# Patient Record
Sex: Male | Born: 1965 | Race: White | Hispanic: No | Marital: Married | Smoking: Never smoker
Health system: Southern US, Community
[De-identification: ages and names within clinical notes are randomized; demographics above are authoritative.]

## PROBLEM LIST (undated history)

## (undated) DIAGNOSIS — F419 Anxiety disorder, unspecified: Secondary | ICD-10-CM

## (undated) DIAGNOSIS — M25569 Pain in unspecified knee: Secondary | ICD-10-CM

## (undated) DIAGNOSIS — M79672 Pain in left foot: Secondary | ICD-10-CM

## (undated) DIAGNOSIS — E739 Lactose intolerance, unspecified: Secondary | ICD-10-CM

## (undated) DIAGNOSIS — M25512 Pain in left shoulder: Secondary | ICD-10-CM

## (undated) DIAGNOSIS — J309 Allergic rhinitis, unspecified: Secondary | ICD-10-CM

## (undated) HISTORY — DX: Allergic rhinitis, unspecified: J30.9

## (undated) HISTORY — DX: Lactose intolerance, unspecified: E73.9

## (undated) HISTORY — DX: Pain in left foot: M79.672

## (undated) HISTORY — DX: Anxiety disorder, unspecified: F41.9

## (undated) HISTORY — DX: Pain in left shoulder: M25.512

## (undated) HISTORY — DX: Pain in unspecified knee: M25.569

---

## 1989-12-28 HISTORY — PX: NOSE SURGERY: SHX723

## 2008-12-07 ENCOUNTER — Encounter: Payer: Self-pay | Admitting: Internal Medicine

## 2009-02-12 ENCOUNTER — Ambulatory Visit: Payer: Self-pay | Admitting: Internal Medicine

## 2009-02-12 DIAGNOSIS — J309 Allergic rhinitis, unspecified: Secondary | ICD-10-CM | POA: Insufficient documentation

## 2009-02-12 DIAGNOSIS — R7989 Other specified abnormal findings of blood chemistry: Secondary | ICD-10-CM | POA: Insufficient documentation

## 2009-02-12 DIAGNOSIS — E739 Lactose intolerance, unspecified: Secondary | ICD-10-CM | POA: Insufficient documentation

## 2009-02-12 DIAGNOSIS — M25569 Pain in unspecified knee: Secondary | ICD-10-CM | POA: Insufficient documentation

## 2009-02-12 LAB — CONVERTED CEMR LAB
BUN: 15 mg/dL (ref 6–23)
Calcium: 9.4 mg/dL (ref 8.4–10.5)
Creatinine, Ser: 1.3 mg/dL (ref 0.4–1.5)
GFR calc Af Amer: 79 mL/min
GFR calc non Af Amer: 65 mL/min

## 2011-07-20 ENCOUNTER — Encounter: Payer: Self-pay | Admitting: Internal Medicine

## 2011-07-20 ENCOUNTER — Ambulatory Visit (INDEPENDENT_AMBULATORY_CARE_PROVIDER_SITE_OTHER): Payer: BC Managed Care – PPO | Admitting: Internal Medicine

## 2011-07-20 ENCOUNTER — Other Ambulatory Visit (INDEPENDENT_AMBULATORY_CARE_PROVIDER_SITE_OTHER): Payer: BC Managed Care – PPO

## 2011-07-20 ENCOUNTER — Other Ambulatory Visit: Payer: Self-pay | Admitting: Internal Medicine

## 2011-07-20 VITALS — BP 112/64 | HR 69 | Temp 99.1°F | Ht 71.0 in | Wt 174.8 lb

## 2011-07-20 DIAGNOSIS — R5381 Other malaise: Secondary | ICD-10-CM

## 2011-07-20 DIAGNOSIS — R05 Cough: Secondary | ICD-10-CM

## 2011-07-20 DIAGNOSIS — R5383 Other fatigue: Secondary | ICD-10-CM

## 2011-07-20 DIAGNOSIS — N289 Disorder of kidney and ureter, unspecified: Secondary | ICD-10-CM | POA: Insufficient documentation

## 2011-07-20 DIAGNOSIS — R059 Cough, unspecified: Secondary | ICD-10-CM

## 2011-07-20 LAB — CBC WITH DIFFERENTIAL/PLATELET
Basophils Relative: 0.4 % (ref 0.0–3.0)
Eosinophils Absolute: 0.1 10*3/uL (ref 0.0–0.7)
HCT: 38.7 % — ABNORMAL LOW (ref 39.0–52.0)
Hemoglobin: 13.6 g/dL (ref 13.0–17.0)
Lymphocytes Relative: 39.3 % (ref 12.0–46.0)
MCHC: 35.3 g/dL (ref 30.0–36.0)
MCV: 90.6 fl (ref 78.0–100.0)
Neutro Abs: 3.2 10*3/uL (ref 1.4–7.7)
RBC: 4.27 Mil/uL (ref 4.22–5.81)

## 2011-07-20 LAB — HEPATIC FUNCTION PANEL
AST: 24 U/L (ref 0–37)
Alkaline Phosphatase: 55 U/L (ref 39–117)
Bilirubin, Direct: 0 mg/dL (ref 0.0–0.3)
Total Bilirubin: 0.3 mg/dL (ref 0.3–1.2)

## 2011-07-20 LAB — BASIC METABOLIC PANEL
BUN: 17 mg/dL (ref 6–23)
CO2: 30 mEq/L (ref 19–32)
Calcium: 9.2 mg/dL (ref 8.4–10.5)
Chloride: 97 mEq/L (ref 96–112)
Creatinine, Ser: 2 mg/dL — ABNORMAL HIGH (ref 0.4–1.5)
Glucose, Bld: 95 mg/dL (ref 70–99)

## 2011-07-20 LAB — TSH: TSH: 2.25 u[IU]/mL (ref 0.35–5.50)

## 2011-07-20 MED ORDER — LORATADINE 10 MG PO TABS: 10.0000 mg | ORAL_TABLET | Freq: Every day | ORAL | Status: DC

## 2011-07-20 MED ORDER — HYDROCODONE-HOMATROPINE 5-1.5 MG/5ML PO SYRP: 5.0000 mL | ORAL_SOLUTION | Freq: Four times a day (QID) | ORAL | Status: AC | PRN

## 2011-07-20 MED ORDER — OMEPRAZOLE 20 MG PO CPDR: 20.0000 mg | DELAYED_RELEASE_CAPSULE | Freq: Every day | ORAL | Status: DC

## 2011-07-20 MED ORDER — DIPHENHYDRAMINE HCL 25 MG PO CAPS: 25.0000 mg | ORAL_CAPSULE | Freq: Every evening | ORAL | Status: AC | PRN

## 2011-07-20 MED ORDER — FLUTICASONE PROPIONATE 50 MCG/ACT NA SUSP: 2.0000 | Freq: Every day | NASAL | Status: DC

## 2011-07-20 NOTE — Progress Notes (Signed)
  Subjective:     Angel Combs is a 45 y.o. male here for evaluation of a cough. Onset of symptoms was 4 weeks ago. Symptoms have been unchanged since that time. The cough is productive of yellow sputum and is aggravated by nothing. Associated symptoms include: no fever, no weight loss; positive for overwhelming fatigue. Patient does not have a history of asthma. Patient does have a history of environmental allergens. Patient has not traveled recently. Patient does not have a history of smoking. Patient has not had a previous chest x-ray. Patient has not had a PPD done.  The following portions of the patient's history were reviewed and updated as appropriate: allergies, current medications, past family history, past medical history, past social history, past surgical history and problem list.  Review of Systems Pertinent items are noted in HPI.    Objective:    Oxygen saturation 96% on room air BP 112/64  Pulse 69  Temp(Src) 99.1 F (37.3 C) (Oral)  Ht 5\' 11"  (1.803 m)  Wt 174 lb 12.8 oz (79.289 kg)  BMI 24.38 kg/m2  SpO2 96% General appearance: alert, flushed and no distress Eyes: negative findings: lids and lashes normal, conjunctivae and sclerae normal and pupils equal, round, reactive to light and accomodation Ears: normal TM's and external ear canals both ears Nose: Nares normal. Septum midline. Mucosa normal. No drainage or sinus tenderness. Throat: lips, mucosa, and tongue normal; teeth and gums normal Neck: no adenopathy, no carotid bruit, no JVD, supple, symmetrical, trachea midline and thyroid not enlarged, symmetric, no tenderness/mass/nodules Lungs: clear to auscultation bilaterally Heart: regular rate and rhythm, S1, S2 normal, no murmur, click, rub or gallop    Assessment:    Cough ;Fatigue   Plan:    Explained lack of efficacy of antibiotics in viral disease. Antitussives per medication orders. Avoid exposure to tobacco smoke and fumes. Call if shortness of breath  worsens, blood in sputum, change in character of cough, development of fever or chills, inability to maintain nutrition and hydration. Avoid exposure to tobacco smoke and fumes. Trial of antihistamines. Trial of proton pump inhibitor. Trial of steroid nasal spray. also labs to eval fatigue - seem viral syndrome related but will exclude other med illness

## 2011-07-20 NOTE — Patient Instructions (Addendum)
It was good to see you today. If you develop worsening symptoms or fever, call and we can reconsider antibiotics, but it does not appear necessary to use antibiotics at this time. For cough, continue allergy medications as reviewed to day and add Hycodan syrup at night as well as Prilosec daily for 2 weeks (control reflux whichmay be contributing to persisting cough) Test(s) ordered today. Your results will be called to you after review (48-72hours after test completion). If any changes need to be made, you will be notified at that time. Please schedule followup in 3-4 weeks if unimproved, call sooner if worse

## 2011-07-31 ENCOUNTER — Other Ambulatory Visit (INDEPENDENT_AMBULATORY_CARE_PROVIDER_SITE_OTHER): Payer: BC Managed Care – PPO

## 2011-07-31 DIAGNOSIS — N289 Disorder of kidney and ureter, unspecified: Secondary | ICD-10-CM

## 2011-07-31 LAB — BASIC METABOLIC PANEL
BUN: 15 mg/dL (ref 6–23)
Chloride: 96 mEq/L (ref 96–112)
GFR: 54.19 mL/min — ABNORMAL LOW (ref >60.00)
Glucose, Bld: 91 mg/dL (ref 70–99)
Potassium: 3.9 mEq/L (ref 3.5–5.1)

## 2011-07-31 LAB — URINALYSIS
Ketones, ur: NEGATIVE
Specific Gravity, Urine: <=1.005 (ref 1.000–1.030)
Urine Glucose: NEGATIVE
Urobilinogen, UA: 0.2 (ref 0.0–1.0)

## 2012-04-21 ENCOUNTER — Other Ambulatory Visit: Payer: Self-pay | Admitting: Internal Medicine

## 2012-04-21 MED ORDER — CITALOPRAM HYDROBROMIDE 10 MG PO TABS: 10.0000 mg | ORAL_TABLET | Freq: Every day | ORAL | Status: DC

## 2012-04-21 NOTE — Telephone Encounter (Signed)
The patient called and is hoping to get a refill of Celexa 10mg  sent to the [facility] on [address].  He has scheduled his cpe for June.

## 2012-04-21 NOTE — Telephone Encounter (Signed)
PATIENT NOTIFIED OF Rx sent to pharmacy.

## 2012-06-09 ENCOUNTER — Encounter: Payer: BC Managed Care – PPO | Admitting: Internal Medicine

## 2012-06-27 DIAGNOSIS — M25512 Pain in left shoulder: Secondary | ICD-10-CM

## 2012-06-27 HISTORY — DX: Pain in left shoulder: M25.512

## 2012-07-27 ENCOUNTER — Ambulatory Visit (INDEPENDENT_AMBULATORY_CARE_PROVIDER_SITE_OTHER): Payer: BC Managed Care – PPO

## 2012-07-27 DIAGNOSIS — Z23 Encounter for immunization: Secondary | ICD-10-CM | POA: Diagnosis not present

## 2012-08-04 ENCOUNTER — Other Ambulatory Visit: Payer: Self-pay | Admitting: Internal Medicine

## 2012-08-17 ENCOUNTER — Ambulatory Visit (INDEPENDENT_AMBULATORY_CARE_PROVIDER_SITE_OTHER): Payer: BC Managed Care – PPO | Admitting: Internal Medicine

## 2012-08-17 ENCOUNTER — Encounter: Payer: Self-pay | Admitting: Internal Medicine

## 2012-08-17 VITALS — BP 106/72 | HR 81 | Temp 97.6°F | Resp 16 | Wt 171.0 lb

## 2012-08-17 DIAGNOSIS — Z0000 Encounter for general adult medical examination without abnormal findings: Secondary | ICD-10-CM

## 2012-08-17 DIAGNOSIS — E782 Mixed hyperlipidemia: Secondary | ICD-10-CM

## 2012-08-17 MED ORDER — CITALOPRAM HYDROBROMIDE 10 MG PO TABS: 10.0000 mg | ORAL_TABLET | Freq: Every day | ORAL | Status: DC

## 2012-08-17 NOTE — Progress Notes (Signed)
Subjective:    Patient ID: Angel Combs, male    DOB: [birth year], 46 y.o.   MRN: [id number]  HPI Angel Combs presents for a routine medical exam. Interval medical history - no major medical problems, no surgery. He has had left shoulder pain, right heel pain. He does c/o regualr Bowel habit but loose stools. He is concerned about possible gluten intolerance although never formally diagnosed by celiac panel. Question about exercise.   He is current with Ophthalmology - for lasik surgery August 22nd. He is current with his dentist - has been diagnosed with bruxism. May need to try a bite guard.   Past Medical History  Diagnosis Date  . ALLERGIC RHINITIS CAUSE UNSPECIFIED   . LACTOSE INTOLERANCE   . PATELLO-FEMORAL SYNDROME     sees chiropractor with good results using exercises.  . Anxiety     using celxa.  . Left shoulder pain July '13    had tried accupuncture.   . Pain of left heel    Past Surgical History  Procedure Date  . Nose surgery 1991  . Lasik Augu 22, 2013   Family History  Problem Relation Age of Onset  . Hyperlipidemia Father   . Hypertension Father   . Coronary artery disease Father   . Diabetes Maternal Grandmother   . Coronary artery disease Other   . Arthritis Mother     back pain   History   Social History  . Marital Status: Single    Spouse Name: N/A    Number of Children: 0  . Years of Education: 24   Occupational History  . Real Chief Technology Officer    Social History Main Topics  . Smoking status: Never Smoker   . Smokeless tobacco: Never Used  . Alcohol Use: 3.5 oz/week    7 drink(s) per week  . Drug Use: No  . Sexually Active: Yes -- Male partner(s)   Other Topics Concern  . Not on file   Social History Narrative   HSG, [employer]/ROTC. BS, MBA, MA-geography, MA foreign affairs  [place], MS- MIS. 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 No regular  exercise program.    Current Outpatient Prescriptions on File Prior to Visit  Medication Sig Dispense Refill  . aspirin 81 MG tablet Take 81 mg by mouth daily.        . citalopram (CELEXA) 10 MG tablet Take 1 tablet (10 mg total) by mouth daily.  30 tablet  5  . fluticasone (FLONASE) 50 MCG/ACT nasal spray instill 2 sprays into each nostril once daily  16 g  1  . Garlic (GARLIQUE) 400 MG TBEC Take by mouth daily.        . Glucosamine-Chondroit-Vit C-Mn (GLUCOSAMINE CHONDR 1500 COMPLX) CAPS Take by mouth daily.        . Multiple Vitamin (MULTIVITAMIN) tablet Take 1 tablet by mouth daily.        . Omega-3 Fatty Acids (FISH OIL) 1000 MG CAPS Take by mouth daily.        . pseudoephedrine (SUDAFED) 30 MG tablet Take 30 mg by mouth every 4 (four) hours as needed.        . loratadine (CLARITIN) 10 MG tablet Take 1 tablet (10 mg total) by mouth daily.          Review of Systems Constitutional:  Negative for fever, chills, activity change and unexpected weight change.  HEENT:  Negative for hearing loss, ear pain, congestion, neck stiffness and postnasal drip. Negative for sore throat or swallowing problems. Negative for dental complaints.   Eyes: Negative for vision loss or change in visual acuity.  Respiratory: Negative for chest tightness and wheezing. Negative for DOE.   Cardiovascular: Negative for chest pain or palpitations. No decreased exercise tolerance Gastrointestinal: No change in bowel habit. No bloating or gas. No reflux or indigestion Genitourinary: Negative for urgency, frequency, flank pain and difficulty urinating.  Musculoskeletal: Negative for myalgias, back pain, arthralgias and gait problem.  Neurological: Negative for dizziness, tremors, weakness and headaches.  Hematological: Negative for adenopathy.  Psychiatric/Behavioral: Negative for behavioral problems and dysphoric mood.       Objective:   Physical Exam Filed Vitals:   08/17/12 1354  BP: 106/72  Pulse: 81    Temp: 97.6 F (36.4 C)  Resp: 16   Wt Readings from Last 3 Encounters:  08/17/12 171 lb (77.565 kg)  07/20/11 174 lb 12.8 oz (79.289 kg)  02/12/09 168 lb (76.204 kg)   Gen'l: Well nourished well developed white male in no acute distress  HEENT: Head: Normocephalic and atraumatic. Right Ear: External ear normal. EAC/TM nl. Left Ear: External ear normal.  EAC/TM nl. Nose: Nose normal. Mouth/Throat: Oropharynx is clear and moist. Dentition - native, in good repair. No buccal or palatal lesions. Posterior pharynx clear. Eyes: Conjunctivae and sclera clear. EOM intact. Pupils are equal, round, and reactive to light. Right eye exhibits no discharge. Left eye exhibits no discharge. Neck: Normal range of motion. Neck supple. No JVD present. No tracheal deviation present. No thyromegaly present.  Cardiovascular: Normal rate, regular rhythm, no gallop, no friction rub, no murmur heard.      Quiet precordium. 2+ radial and DP pulses . No carotid bruits Pulmonary/Chest: Effort normal. No respiratory distress or increased WOB, no wheezes, no rales. No chest wall deformity or CVAT. Abdomen: Soft. Bowel sounds are normal in all quadrants. He exhibits no distension, no tenderness, no rebound or guarding, No heptosplenomegaly  Genitourinary:  deferred Musculoskeletal: Normal range of motion. He exhibits no edema and no tenderness.       Small and large joints without redness, synovial thickening or deformity. Full range of motion preserved about all small, median and large joints. Particular attention to left shoulder: no click, no crepitus, minimal tenderness with adduction against resistance. Left heel w/o tenderness to deep, point palpation Lymphadenopathy:    He has no cervical or supraclavicular adenopathy.  Neurological: He is alert and oriented to person, place, and time. CN II-XII intact. DTRs 2+ and symmetrical biceps, radial and patellar tendons. Cerebellar function normal with no tremor, rigidity,  normal gait and station.  Skin: Skin is warm and dry. No rash noted. No erythema.  Psychiatric: He has a normal mood and affect. His behavior is normal. Thought content normal.   Lab Results  Component Value Date   WBC 5.9 08/18/2012   HGB 14.7 08/18/2012   HCT 43.1 08/18/2012   PLT 209.0 08/18/2012   GLUCOSE 84 08/18/2012   CHOL 358* 08/18/2012   TRIG 433.0* 08/18/2012   HDL 56.70 08/18/2012   LDLDIRECT 116.1 08/18/2012        ALT 20 08/18/2012   AST 21 08/18/2012        NA 132* 08/18/2012   K 3.9 08/18/2012   CL 97 08/18/2012   CREATININE 1.2 08/18/2012   BUN 15 08/18/2012   CO2 27 08/18/2012   TSH 1.99 08/18/2012  Assessment & Plan:

## 2012-08-18 ENCOUNTER — Other Ambulatory Visit (INDEPENDENT_AMBULATORY_CARE_PROVIDER_SITE_OTHER): Payer: BC Managed Care – PPO

## 2012-08-18 DIAGNOSIS — Z0000 Encounter for general adult medical examination without abnormal findings: Secondary | ICD-10-CM

## 2012-08-18 HISTORY — PX: OTHER SURGICAL HISTORY: SHX169

## 2012-08-18 LAB — COMPREHENSIVE METABOLIC PANEL
ALT: 20 U/L (ref 0–53)
Alkaline Phosphatase: 53 U/L (ref 39–117)
Creatinine, Ser: 1.2 mg/dL (ref 0.4–1.5)
GFR: 73.28 mL/min (ref >60.00)
Sodium: 132 mEq/L — ABNORMAL LOW (ref 135–145)
Total Bilirubin: 0.4 mg/dL (ref 0.3–1.2)
Total Protein: 7.6 g/dL (ref 6.0–8.3)

## 2012-08-18 LAB — CBC WITH DIFFERENTIAL/PLATELET
Basophils Absolute: 0 10*3/uL (ref 0.0–0.1)
Eosinophils Relative: 2.2 % (ref 0.0–5.0)
HCT: 43.1 % (ref 39.0–52.0)
Lymphs Abs: 2.5 10*3/uL (ref 0.7–4.0)
MCV: 92.2 fl (ref 78.0–100.0)
Monocytes Absolute: 0.6 10*3/uL (ref 0.1–1.0)
Neutrophils Relative %: 44.9 % (ref 43.0–77.0)
Platelets: 209 10*3/uL (ref 150.0–400.0)
RDW: 12.8 % (ref 11.5–14.6)
WBC: 5.9 10*3/uL (ref 4.5–10.5)

## 2012-08-18 LAB — HEPATIC FUNCTION PANEL
ALT: 20 U/L (ref 0–53)
AST: 21 U/L (ref 0–37)
Albumin: 4.4 g/dL (ref 3.5–5.2)
Alkaline Phosphatase: 53 U/L (ref 39–117)
Total Bilirubin: 0.4 mg/dL (ref 0.3–1.2)

## 2012-08-18 LAB — LIPID PANEL
Cholesterol: 358 mg/dL — ABNORMAL HIGH (ref 0–200)
HDL: 56.7 mg/dL (ref >39.00)
Total CHOL/HDL Ratio: 6
Triglycerides: 433 mg/dL — ABNORMAL HIGH (ref 0.0–149.0)

## 2012-08-18 LAB — TSH: TSH: 1.99 u[IU]/mL (ref 0.35–5.50)

## 2012-08-18 LAB — LDL CHOLESTEROL, DIRECT: Direct LDL: 116.1 mg/dL

## 2012-08-19 DIAGNOSIS — Z0000 Encounter for general adult medical examination without abnormal findings: Secondary | ICD-10-CM | POA: Insufficient documentation

## 2012-08-19 DIAGNOSIS — E782 Mixed hyperlipidemia: Secondary | ICD-10-CM | POA: Insufficient documentation

## 2012-08-19 NOTE — Assessment & Plan Note (Signed)
Total 358 (0-200 nl range) Trigylcerides 433 (0-150) but the LDL (bad) is 116- better then goal of 130 or less and the HDL (good )  Is 56.7, better than goal of 40+.  Plan   Although healthy his father has CAD and required intervention, so for prudence will refer to [facility]: may nee NMR lipo-profile to determine best treatment plan  Follow low fat diet  Continue aerobic exercise program

## 2012-08-19 NOTE — Assessment & Plan Note (Signed)
Interval medical history: notable for MSK complaints. Left shoulder pain - on exam no evidence of DJD or rotator cuff injury but more c/w bursitis. Plan - ROM exercise, gradual return to weight training involving shoulder with the guidance of a personal trainer. Left foot pain - exam not consistent plantar fasciitis but more with strain. No specific intervention but low impact training will be safer than high-impact running/jogging.  Weight management: BMI 23 ( nl 19-25) but he has noticed some increased weight, increased girth. Plan: smart eating with controlled portion size; regular aerobic exercise - minimum of 3 sessions a week for 30+ minutes with a target heart rate of 120+. A job - not a leisure time activity.  Physical exam is normal. Lab results are normal except for cholesterol values. He is brought up to date with immunizations. He is advised to have repeat general wellness exam in 2 - 3 years. He will return sooner on an as needed basis.

## 2012-08-22 ENCOUNTER — Telehealth: Payer: Self-pay | Admitting: Internal Medicine

## 2012-08-22 NOTE — Telephone Encounter (Signed)
Pt wants to know why he is being referred to the coumadin clinic.

## 2012-08-22 NOTE — Telephone Encounter (Signed)
I referred him to the lipid clinic due to his mix of relatively normal LDL, high tryglycerides, high total cholesterol. Given the family history of CAD I want the lipid specialists to help decide if he needs additional testing, i.e. NMR-lipo profile that may lead to treatment to reduce cardiac risk.

## 2012-08-24 ENCOUNTER — Ambulatory Visit: Payer: BC Managed Care – PPO | Admitting: Pharmacist

## 2012-08-24 ENCOUNTER — Ambulatory Visit (INDEPENDENT_AMBULATORY_CARE_PROVIDER_SITE_OTHER): Payer: BC Managed Care – PPO | Admitting: Pharmacist

## 2012-08-24 DIAGNOSIS — E782 Mixed hyperlipidemia: Secondary | ICD-10-CM

## 2012-08-24 NOTE — Patient Instructions (Addendum)
Increase fish oil to 2 grams daily.   We will set you up to go to [facility] to do the Liposcience profile.   If you have any questions, feel free to call Weston Brass, PharmD at [phone]

## 2012-08-24 NOTE — Progress Notes (Signed)
HPI  Mr. Angel Combs is a 46 yo M who was referred to the lipid clinic by Dr. Debby Bud.  Pt was referred to lipid clinic to discuss advanced lipid testing in light of family history of CAD.  Reviewed pt's risk factors.  He is not a smoker, does not have HTN or DM and his age does not count as a risk factor.  Reviewed family history.  His father had a stent placed in his 82s.  His maternal grandfather had an MI in his 65s and his paternal grandfather had an MI at an older age but pt states he lived a high-risk lifestyle (smoker, etc).    Pt's diet fairly good.  For breakfast he will eat cereal, yogurt or eggs.  He eats out for lunch and dinner most days.  He doesn't drink soft drinks and tries to limit fried foods and breads.   Pt likes to go to they gym 4 times a week for about 40 minutes at a time.  He does a combination of strength and cardio exercises.   Current Outpatient Prescriptions  Medication Sig Dispense Refill  . aspirin 81 MG tablet Take 81 mg by mouth daily.        . citalopram (CELEXA) 10 MG tablet Take 1 tablet (10 mg total) by mouth daily.  90 tablet  3  . fluticasone (FLONASE) 50 MCG/ACT nasal spray instill 2 sprays into each nostril once daily  16 g  1  . Garlic (GARLIQUE) 400 MG TBEC Take by mouth daily.        . Glucosamine-Chondroit-Vit C-Mn (GLUCOSAMINE CHONDR 1500 COMPLX) CAPS Take by mouth daily.        [name] loratadine (CLARITIN) 10 MG tablet Take 1 tablet (10 mg total) by mouth daily.      . Multiple Vitamin (MULTIVITAMIN) tablet Take 1 tablet by mouth daily.        . Omega-3 Fatty Acids (FISH OIL) 1000 MG CAPS Take by mouth daily.        . pseudoephedrine (SUDAFED) 30 MG tablet Take 30 mg by mouth every 4 (four) hours as needed.

## 2012-08-25 NOTE — Telephone Encounter (Signed)
Pt already seen at Lipid clinic at the time of this message. No call was made

## 2012-08-31 ENCOUNTER — Other Ambulatory Visit: Payer: BC Managed Care – PPO

## 2012-09-01 NOTE — Assessment & Plan Note (Signed)
Reviewed pt's most recent cholesterol panel.  TC- 358 (goal<200), TG- 433 (goal<150), HDL- 56 (goal>40), LDL- 116 (goal<160). Non-HDL- 202 (goal<190) LFTs are WNL.  As of right now, pt would not necessarily meet guidelines to treat LDL- only non-HDL.  Discussed use and application of advanced testing.  Pt is agreeable to proceed.  Will set up labs to check a lipoprofile and determine best treatment at that point.  Until then, will increase fish oil to try to decrease TG.  Will follow up once lab results are available.

## 2012-09-02 ENCOUNTER — Other Ambulatory Visit: Payer: BC Managed Care – PPO

## 2012-09-02 DIAGNOSIS — E782 Mixed hyperlipidemia: Secondary | ICD-10-CM

## 2012-09-06 LAB — NMR LIPOPROFILE WITH LIPIDS
Cholesterol, Total: 266 mg/dL — ABNORMAL HIGH (ref <200)
HDL Particle Number: 33.8 umol/L (ref >=30.5)
HDL-C: 52 mg/dL (ref >=40)
LDL (calc): 172 mg/dL — ABNORMAL HIGH (ref <100)
Small LDL Particle Number: 414 nmol/L (ref <=527)
Triglycerides: 209 mg/dL — ABNORMAL HIGH (ref <150)

## 2012-09-09 ENCOUNTER — Encounter: Payer: Self-pay | Admitting: Internal Medicine

## 2012-09-28 ENCOUNTER — Telehealth: Payer: Self-pay | Admitting: Internal Medicine

## 2012-09-28 DIAGNOSIS — E782 Mixed hyperlipidemia: Secondary | ICD-10-CM

## 2012-09-28 NOTE — Telephone Encounter (Signed)
Called patient - there is some confusion about tests and follow up.   He has not had a chance to see or talk with ms. Putt about his NMR lipoprofile and it's meaning; his repeat lab was tgy and calc LDL only without a total cholesterol or direct LDL so that we are not  comparing to previous lipid panel with direct LDL 116!  Plan 1. Repeat full lipid panel with a direct LDL 2. F/u appointment with lipid clinic (PharmD or MD) to review meaning of Lipo-profile results and latest direct LDL as to whether he is a candidate for medical management.

## 2012-09-28 NOTE — Telephone Encounter (Signed)
Caller: Tee/Patient; Phone: [phone]; Reason for Call: Patient calling regarding lab results he received for his Cholesterol.  Requesting to speak with the nurse.  Please call him back.  Thanks

## 2012-09-29 ENCOUNTER — Other Ambulatory Visit (INDEPENDENT_AMBULATORY_CARE_PROVIDER_SITE_OTHER): Payer: BC Managed Care – PPO

## 2012-09-29 DIAGNOSIS — E782 Mixed hyperlipidemia: Secondary | ICD-10-CM

## 2012-09-29 LAB — LIPID PANEL
Cholesterol: 212 mg/dL — ABNORMAL HIGH (ref 0–200)
Total CHOL/HDL Ratio: 4
VLDL: 24.8 mg/dL (ref 0.0–40.0)

## 2012-09-29 LAB — LDL CHOLESTEROL, DIRECT: Direct LDL: 88.8 mg/dL

## 2012-09-29 NOTE — Telephone Encounter (Signed)
Called and left patient a message.  I had spoken to patient before he went out of the country but the results of the lipoprofile were not available yet.  I have been out of the office since he has been back in the country, so not able to follow up until this point.  LDL particle number is at goal.  The LDL available on the lipoprofile was calculated and not direct, so this is likely the reason for the large discrepancy between the 2 numbers.   Reviewed labs that were drawn on 10/3.  LDL direct improved from 116 to 88.  Based on these results and the LDL particle number being at goal, would not suggest medication at this time; only lifestyle management.  Will continue to try to reach patient to discuss results.

## 2012-09-30 NOTE — Telephone Encounter (Signed)
Spoke with pt.  He is aware of his recent lab results.  He has made large changes in his lifestyle that likely have contributed to the improvement in LDL and TG.  He has cut down on breads, sweets and alcohol and increased his fish oil.  He will follow up with Dr. Debby Bud every 6-12 months to recheck labs in the future. Also mailed pt access code for MyChart so he can access his lab results online in the future.

## 2012-10-02 ENCOUNTER — Encounter: Payer: Self-pay | Admitting: Internal Medicine

## 2013-01-01 ENCOUNTER — Other Ambulatory Visit: Payer: Self-pay | Admitting: Internal Medicine

## 2013-01-02 ENCOUNTER — Other Ambulatory Visit: Payer: Self-pay | Admitting: *Deleted

## 2013-01-02 MED ORDER — FLUTICASONE PROPIONATE 50 MCG/ACT NA SUSP: 2.0000 | Freq: Every day | NASAL | Status: DC

## 2013-05-25 ENCOUNTER — Other Ambulatory Visit: Payer: Self-pay | Admitting: Internal Medicine

## 2013-08-01 ENCOUNTER — Other Ambulatory Visit: Payer: Self-pay | Admitting: Internal Medicine

## 2013-08-01 ENCOUNTER — Other Ambulatory Visit (INDEPENDENT_AMBULATORY_CARE_PROVIDER_SITE_OTHER): Payer: BC Managed Care – PPO

## 2013-08-01 DIAGNOSIS — E782 Mixed hyperlipidemia: Secondary | ICD-10-CM

## 2013-08-01 LAB — LIPID PANEL
Cholesterol: 224 mg/dL — ABNORMAL HIGH (ref 0–200)
HDL: 55.8 mg/dL (ref >39.00)
Triglycerides: 205 mg/dL — ABNORMAL HIGH (ref 0.0–149.0)

## 2013-08-04 ENCOUNTER — Encounter: Payer: Self-pay | Admitting: Internal Medicine

## 2013-09-12 ENCOUNTER — Other Ambulatory Visit: Payer: Self-pay | Admitting: Internal Medicine

## 2014-01-02 ENCOUNTER — Other Ambulatory Visit: Payer: Self-pay

## 2014-01-02 MED ORDER — FLUTICASONE PROPIONATE 50 MCG/ACT NA SUSP: 2.0000 | Freq: Every day | NASAL | Status: DC

## 2014-02-13 ENCOUNTER — Encounter: Payer: BC Managed Care – PPO | Admitting: Internal Medicine

## 2014-02-14 ENCOUNTER — Ambulatory Visit (INDEPENDENT_AMBULATORY_CARE_PROVIDER_SITE_OTHER): Payer: BC Managed Care – PPO | Admitting: Internal Medicine

## 2014-02-14 ENCOUNTER — Other Ambulatory Visit: Payer: BC Managed Care – PPO

## 2014-02-14 ENCOUNTER — Encounter: Payer: Self-pay | Admitting: Internal Medicine

## 2014-02-14 VITALS — BP 122/68 | HR 63 | Temp 98.2°F | Ht 71.0 in | Wt 172.0 lb

## 2014-02-14 DIAGNOSIS — J309 Allergic rhinitis, unspecified: Secondary | ICD-10-CM

## 2014-02-14 DIAGNOSIS — Z0000 Encounter for general adult medical examination without abnormal findings: Secondary | ICD-10-CM

## 2014-02-14 DIAGNOSIS — E782 Mixed hyperlipidemia: Secondary | ICD-10-CM

## 2014-02-14 LAB — BASIC METABOLIC PANEL
BUN: 11 mg/dL (ref 6–23)
CALCIUM: 9.3 mg/dL (ref 8.4–10.5)
CO2: 26 mEq/L (ref 19–32)
Chloride: 100 mEq/L (ref 96–112)
Creatinine, Ser: 1.1 mg/dL (ref 0.4–1.5)
GFR: 75.83 mL/min (ref >60.00)
GLUCOSE: 88 mg/dL (ref 70–99)
Potassium: 4.3 mEq/L (ref 3.5–5.1)
SODIUM: 134 meq/L — AB (ref 135–145)

## 2014-02-14 NOTE — Assessment & Plan Note (Signed)
Generally well controlled with loratadine and Flonase. Currently having a flare and frontal headache with a non-focal neuro exam.  Plan Continue loratadine and flonase  increase sudafed 30 mg to tid  Continue nasal saline spray

## 2014-02-14 NOTE — Assessment & Plan Note (Signed)
Has a family h/o CAD. Previously elevated TC and Tgy with good HDL and moderately elevated LDL. With diet and exercise he has brought down TC as of last August with good LDL at 94, HDL 58 and Tgy <161<300.  Plan NMR lipoprofile for more accurate assessment of lipid status and CAD risk.

## 2014-02-14 NOTE — Assessment & Plan Note (Signed)
Interval h/o negatrive for major illness, surgery or injury. Phsical exam is normal. Bmet is normal. Discussed maintenance issues: colonoscopy at 50, shingles vaccine possibly at 50 to 60; pneumonia vaccines at 65. He is current with tetanus.  In summary  A healthy man. He is encouraged to continue investing in his health with regular exercise and weight management. He should have general physical exam in 2 years.

## 2014-02-14 NOTE — Progress Notes (Signed)
Pre visit review using our clinic review tool, if applicable. No additional management support is needed unless otherwise documented below in the visit note. 

## 2014-02-14 NOTE — Progress Notes (Signed)
Subjective:    Patient ID: Angel Combs, male    DOB: 1966/06/09, 48 y.o.   MRN: [id number]  HPI Angel Combs presents routine medical exam. He is doing well with no major health issues except usual allergies. He is having a frontal headache that is persistent. He has had allergy testing and shots in the past. He is using flonase regularly. He has had Lasik surgery - one eye corrected for presbyopia, one eye for myopia. His brain has made the adjustment.   He has otherwise been healthy.  Past Medical History  Diagnosis Date  . ALLERGIC RHINITIS CAUSE UNSPECIFIED   . LACTOSE INTOLERANCE   . PATELLO-FEMORAL SYNDROME     sees chiropractor with good results using exercises.  . Anxiety     using celxa.  . Left shoulder pain July '13    had tried accupuncture.   . Pain of left heel    Past Surgical History  Procedure Laterality Date  . Nose surgery  1991  . Lasik  Augu 22, 2013   Family History  Problem Relation Age of Onset  . Hyperlipidemia Father   . Hypertension Father   . Coronary artery disease Father   . Diabetes Maternal Grandmother   . Coronary artery disease Other   . Arthritis Mother     back pain   History   Social History  . Marital Status: Single    Spouse Name: N/A    Number of Children: 0  . Years of Education: 24   Occupational History  . Real Chief Technology Officerestate developer    Social History Main Topics  . Smoking status: Never Smoker   . Smokeless tobacco: Never Used  . Alcohol Use: No  . Drug Use: No  . Sexual Activity: Yes    Partners: Female   Other Topics Concern  . Not on file   Social History Narrative   HSG, [employer]/ROTC. BS, MBA, MA-geography, MA foreign affairs  [place], [place]- M[place]. 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 No regular exercise program.    Current Outpatient Prescriptions on File Prior to Visit  Medication Sig Dispense Refill  . aspirin 81  MG tablet Take 81 mg by mouth daily.        . citalopram (CELEXA) 10 MG tablet take 1 tablet by mouth once daily  90 tablet  3  . fluticasone (FLONASE) 50 MCG/ACT nasal spray Place 2 sprays into both nostrils daily.  16 g  2  . Omega-3 Fatty Acids (FISH OIL) 1000 MG CAPS Take by mouth daily.        . pseudoephedrine (SUDAFED) 30 MG tablet Take 30 mg by mouth every 4 (four) hours as needed.        . loratadine (CLARITIN) 10 MG tablet Take 1 tablet (10 mg total) by mouth daily.       No current facility-administered medications on file prior to visit.      Review of Systems Constitutional:  Negative for fever, chills, activity change and unexpected weight change.  HEENT:  Negative for hearing loss, ear pain, congestion, neck stiffness. Has chronic postnasal drip and congestion. Negative for sore throat or swallowing problems. Negative for dental complaints.   Eyes: Negative for vision loss or change in visual acuity.  Respiratory: Negative for chest tightness and wheezing. Negative for DOE.   Cardiovascular: Negative for chest pain or palpitations. No decreased exercise tolerance  Gastrointestinal: No change in bowel habit. No bloating or gas. No reflux or indigestion Genitourinary: Negative for urgency, frequency, flank pain and difficulty urinating.  Musculoskeletal: Negative for myalgias, back pain, arthralgias and gait problem. Has done well with patello-femoral syndrome.  Neurological: Negative for dizziness, tremors, weakness and headaches.  Hematological: Negative for adenopathy.  Psychiatric/Behavioral: Negative for behavioral problems and dysphoric mood.       Objective:   Physical Exam Filed Vitals:   02/14/14 1121  BP: 122/68  Pulse: 63  Temp: 98.2 F (36.8 C)   Wt Readings from Last 3 Encounters:  02/14/14 172 lb (78.019 kg)  08/17/12 171 lb (77.565 kg)  07/20/11 174 lb 12.8 oz (79.289 kg)   Gen'l: Well nourished well developed     male in no acute distress  HEENT:  Head: Normocephalic and atraumatic. Right Ear: External ear normal. EAC/TM nl. Left Ear: External ear normal.  EAC/TM nl. Nose: Nose normal. Mouth/Throat: Oropharynx is clear and moist. Dentition - native, in good repair. No buccal or palatal lesions. Posterior pharynx clear. Eyes: Conjunctivae and sclera clear. EOM intact. Pupils are equal, round, and reactive to light. Right eye exhibits no discharge. Left eye exhibits no discharge. Neck: Normal range of motion. Neck supple. No JVD present. No tracheal deviation present. No thyromegaly present.  Cardiovascular: Normal rate, regular rhythm, no gallop, no friction rub, no murmur heard.      Quiet precordium. 2+ radial and DP pulses . No carotid bruits Pulmonary/Chest: Effort normal. No respiratory distress or increased WOB, no wheezes, no rales. No chest wall deformity or CVAT. Abdomen: Soft. Bowel sounds are normal in all quadrants. He exhibits no distension, no tenderness, no rebound or guarding, No heptosplenomegaly  Genitourinary:   Musculoskeletal: Normal range of motion. He exhibits no edema and no tenderness.       Small and large joints without redness, synovial thickening or deformity. Full range of motion preserved about all small, median and large joints.  Lymphadenopathy:    He has no cervical or supraclavicular adenopathy.  Neurological: He is alert and oriented to person, place, and time. CN II-XII intact. DTRs 2+ and symmetrical biceps, radial and patellar tendons. Cerebellar function normal with no tremor, rigidity, normal gait and station.  Skin: Skin is warm and dry. No rash noted. No erythema.  Psychiatric: He has a normal mood and affect. His behavior is normal. Thought content normal.   Recent Results (from the past 2160 hour(s))  BASIC METABOLIC PANEL     Status: Abnormal   Collection Time    02/14/14 12:27 PM      Result Value Ref Range   Sodium 134 (*) 135 - 145 mEq/L   Potassium 4.3  3.5 - 5.1 mEq/L   Chloride 100  96 -  112 mEq/L   CO2 26  19 - 32 mEq/L   Glucose, Bld 88  70 - 99 mg/dL   BUN 11  6 - 23 mg/dL   Creatinine, Ser 1.1  0.4 - 1.5 mg/dL   Calcium 9.3  8.4 - 16.1 mg/dL   GFR 09.60  >45.40 mL/min         Assessment & Plan:

## 2014-02-14 NOTE — Patient Instructions (Signed)
Thanks for coming in. Moving forward I would recommend Dr. Sanda Lingerhomas Jones to be your primary care physician.  Health management: you are doing a great job investing to your health. Remember to look at high reps with lower weight to improve toning and reduce the risk of strain and injury.  Carpal tunnel - on exam you have mild carpal tunnel bilaterally. Try using a cock-up wrist splint at night and if needed when driving long distance. This is not a dangerous condition, another nuisance.  Allergies - it is what it is. For the headache you have in the frontal skull - try the sudafed 30 mg 2 or 3 times a day.  Immunizations - current with tetanus -due for booster 2023; Shingles vaccine at 50; pneumonia vaccines at 65.  Lipids - the last numbers looked good: HDL 58, LDL 94 but with your family history will get an NMR lipoprofile for a more detailed analysis of lipids. Other lab will be a basic metabolic profile to look at sugar and kidney function. Results will be posted to MyChart - please sign up.  Alcohol use - your current use poses no health risk.  Fingernails - tough nut to crack. No medical treatment that I know of.   Over all you seem to be in good health.

## 2014-02-15 LAB — NMR LIPOPROFILE WITH LIPIDS
Cholesterol, Total: 179 mg/dL (ref <200)
HDL Particle Number: 34.3 umol/L (ref >=30.5)
HDL SIZE: 9.1 nm — AB (ref >=9.2)
HDL-C: 61 mg/dL (ref >=40)
LARGE HDL: 6.9 umol/L (ref >=4.8)
LDL CALC: 87 mg/dL (ref <100)
LDL PARTICLE NUMBER: 579 nmol/L (ref <1000)
LDL SIZE: 21.2 nm (ref >20.5)
LP-IR Score: 31 (ref <=45)
Large VLDL-P: 2.3 nmol/L (ref <=2.7)
Small LDL Particle Number: 123 nmol/L (ref <=527)
TRIGLYCERIDES: 154 mg/dL — AB (ref <150)
VLDL SIZE: 41.2 nm (ref <=46.6)

## 2014-02-19 ENCOUNTER — Encounter: Payer: Self-pay | Admitting: Internal Medicine

## 2014-06-14 ENCOUNTER — Ambulatory Visit (INDEPENDENT_AMBULATORY_CARE_PROVIDER_SITE_OTHER): Payer: BC Managed Care – PPO | Admitting: Internal Medicine

## 2014-06-14 ENCOUNTER — Encounter: Payer: Self-pay | Admitting: Internal Medicine

## 2014-06-14 VITALS — BP 110/72 | HR 76 | Temp 98.8°F | Resp 16 | Wt 167.0 lb

## 2014-06-14 DIAGNOSIS — H6690 Otitis media, unspecified, unspecified ear: Secondary | ICD-10-CM

## 2014-06-14 DIAGNOSIS — R42 Dizziness and giddiness: Secondary | ICD-10-CM

## 2014-06-14 LAB — GLUCOSE, POCT (MANUAL RESULT ENTRY): POC GLUCOSE: 93 mg/dL (ref 70–99)

## 2014-06-14 MED ORDER — AZITHROMYCIN 250 MG PO TABS: ORAL_TABLET | ORAL | Status: DC

## 2014-06-14 NOTE — Progress Notes (Signed)
Pre visit review using our clinic review tool, if applicable. No additional management support is needed unless otherwise documented below in the visit note. 

## 2014-06-14 NOTE — Patient Instructions (Signed)
6/15 - likely BPV and L OM Meclizine Sudafed as needed ?Benign Positional Vertigo symptoms on the left vs other  Use Meclizine. Start Angel PiccoloBrandt - Daroff exercise several times a day as dirrected. Come back if not better

## 2014-06-14 NOTE — Progress Notes (Signed)
   Subjective:    Dizziness This is a new problem. The current episode started yesterday. The problem occurs 2 to 4 times per day. The problem has been waxing and waning. Pertinent negatives include no chest pain, congestion, coughing, fatigue, headaches, joint swelling, nausea, neck pain, rash, sore throat or weakness. He has tried position changes (meclizine) for the symptoms.   R ear ringing, "ear blocked"  C/o nasal congestion -- yellow d/c x 3-4 d   Review of Systems  Constitutional: Negative for appetite change, fatigue and unexpected weight change.  HENT: Negative for congestion, hearing loss, nosebleeds, sneezing, sore throat and trouble swallowing.   Eyes: Negative for itching and visual disturbance.  Respiratory: Negative for cough.   Cardiovascular: Negative for chest pain, palpitations and leg swelling.  Gastrointestinal: Negative for nausea, diarrhea, blood in stool and abdominal distention.  Genitourinary: Negative for frequency and hematuria.  Musculoskeletal: Negative for back pain, gait problem, joint swelling and neck pain.  Skin: Negative for rash.  Neurological: Positive for dizziness. Negative for tremors, facial asymmetry, speech difficulty, weakness, light-headedness and headaches.  Psychiatric/Behavioral: Negative for sleep disturbance, dysphoric mood and agitation. The patient is not nervous/anxious.        Objective:   Physical Exam  Constitutional: He is oriented to person, place, and time. He appears well-developed. No distress.  NAD  HENT:  Right Ear: External ear normal.  Mouth/Throat: Oropharynx is clear and moist. No oropharyngeal exudate.  L TM dull  Eyes: Conjunctivae are normal. Pupils are equal, round, and reactive to light. Right eye exhibits no discharge. Left eye exhibits no discharge.  Neck: Normal range of motion. No JVD present. No thyromegaly present.  Cardiovascular: Normal rate, regular rhythm, normal heart sounds and intact distal  pulses.  Exam reveals no gallop and no friction rub.   No murmur heard. Pulmonary/Chest: Effort normal and breath sounds normal. No respiratory distress. He has no wheezes. He has no rales. He exhibits no tenderness.  Abdominal: Soft. Bowel sounds are normal. He exhibits no distension and no mass. There is no tenderness. There is no rebound and no guarding.  Musculoskeletal: Normal range of motion. He exhibits no edema and no tenderness.  Lymphadenopathy:    He has no cervical adenopathy.  Neurological: He is alert and oriented to person, place, and time. He has normal reflexes. No cranial nerve deficit. He exhibits normal muscle tone. He displays a negative Romberg sign. Coordination and gait normal.  No meningeal signs  Skin: Skin is warm and dry. No rash noted.  Psychiatric: He has a normal mood and affect. His behavior is normal. Judgment and thought content normal.     H-P (-) B     Assessment & Plan:

## 2014-06-14 NOTE — Assessment & Plan Note (Signed)
Zpac 

## 2014-06-14 NOTE — Assessment & Plan Note (Signed)
6/15 - likely BPV and L OM Meclizine Sudafed prn ?Benign Positional Vertigo symptoms on the left vs other  Use Meclizine. Start Francee PiccoloBrandt - Daroff exercise several times a day as dirrected.

## 2014-06-18 ENCOUNTER — Telehealth: Payer: Self-pay | Admitting: Internal Medicine

## 2014-06-18 ENCOUNTER — Ambulatory Visit: Payer: BC Managed Care – PPO | Admitting: Internal Medicine

## 2014-06-18 DIAGNOSIS — Z0289 Encounter for other administrative examinations: Secondary | ICD-10-CM

## 2014-06-18 NOTE — Telephone Encounter (Signed)
Patient no showed for acute appt for dizziness.  Did not reschedule.  Please advise.

## 2014-06-20 NOTE — Telephone Encounter (Signed)
noted 

## 2014-10-09 ENCOUNTER — Telehealth: Payer: Self-pay | Admitting: Internal Medicine

## 2014-10-09 NOTE — Telephone Encounter (Signed)
I am okay seeing him

## 2014-10-09 NOTE — Telephone Encounter (Signed)
Patient called in to see why he had an appointment with Dr. Dorise HissKollar this Thursday, and upon learning this was a transfer appointment from [place] he stated that he was under the impression that you had already consented to taking him as a patient.  He stated you see his father Larrie Kass( [place] Fleischer ) and the two of you had discussed it.  Please advise before he comes in to see Dr. Dorise HissKollar.

## 2014-10-10 NOTE — Telephone Encounter (Signed)
Made patient an appt on Friday, 10/19/14 at 9:00am.

## 2014-10-11 ENCOUNTER — Ambulatory Visit: Payer: BC Managed Care – PPO | Admitting: Internal Medicine

## 2014-10-19 ENCOUNTER — Ambulatory Visit (INDEPENDENT_AMBULATORY_CARE_PROVIDER_SITE_OTHER): Payer: BC Managed Care – PPO | Admitting: Internal Medicine

## 2014-10-19 ENCOUNTER — Encounter: Payer: Self-pay | Admitting: Internal Medicine

## 2014-10-19 VITALS — BP 118/68 | HR 60 | Temp 97.8°F | Resp 16 | Ht 71.0 in | Wt 170.0 lb

## 2014-10-19 DIAGNOSIS — J301 Allergic rhinitis due to pollen: Secondary | ICD-10-CM

## 2014-10-19 DIAGNOSIS — F32A Depression, unspecified: Secondary | ICD-10-CM | POA: Insufficient documentation

## 2014-10-19 DIAGNOSIS — F329 Major depressive disorder, single episode, unspecified: Secondary | ICD-10-CM

## 2014-10-19 DIAGNOSIS — E782 Mixed hyperlipidemia: Secondary | ICD-10-CM

## 2014-10-19 MED ORDER — CITALOPRAM HYDROBROMIDE 10 MG PO TABS: 10.0000 mg | ORAL_TABLET | Freq: Every day | ORAL | Status: DC

## 2014-10-19 NOTE — Assessment & Plan Note (Signed)
He is doing well on celexa

## 2014-10-19 NOTE — Progress Notes (Signed)
   Subjective:    Patient ID: Angel Combs, male    DOB: 1966-01-06, 48 y.o.   MRN: [id number]  Hyperlipidemia This is a recurrent problem. The current episode started more than 1 year ago. The problem is controlled. Recent lipid tests were reviewed and are variable. He has no history of chronic renal disease, diabetes, hypothyroidism, liver disease, obesity or nephrotic syndrome. There are no known factors aggravating his hyperlipidemia. Pertinent negatives include no chest pain, focal sensory loss, focal weakness, leg pain, myalgias or shortness of breath. Current antihyperlipidemic treatment includes diet change. The current treatment provides significant improvement of lipids. There are no compliance problems.       Review of Systems  Constitutional: Negative.  Negative for fever, chills, diaphoresis, appetite change and fatigue.  HENT: Negative.   Eyes: Negative.   Respiratory: Negative.  Negative for cough, choking, chest tightness, shortness of breath and stridor.   Cardiovascular: Negative.  Negative for chest pain, palpitations and leg swelling.  Gastrointestinal: Negative.  Negative for nausea, vomiting, abdominal pain, diarrhea and constipation.  Endocrine: Negative.   Genitourinary: Negative.   Musculoskeletal: Negative.  Negative for arthralgias, back pain, myalgias and neck pain.  Skin: Negative.  Negative for rash.  Allergic/Immunologic: Negative.   Neurological: Negative.  Negative for focal weakness.  Hematological: Negative.  Negative for adenopathy. Does not bruise/bleed easily.  Psychiatric/Behavioral: Negative.  Negative for suicidal ideas, confusion, sleep disturbance, self-injury, dysphoric mood, decreased concentration and agitation. The patient is not nervous/anxious and is not hyperactive.        Objective:   Physical Exam  Vitals reviewed. Constitutional: He is oriented to person, place, and time. He appears well-developed and well-nourished. No distress.    HENT:  Head: Normocephalic and atraumatic.  Mouth/Throat: Oropharynx is clear and moist. No oropharyngeal exudate.  Eyes: Conjunctivae are normal. Right eye exhibits no discharge. Left eye exhibits no discharge. No scleral icterus.  Neck: Normal range of motion. Neck supple. No JVD present. No tracheal deviation present. No thyromegaly present.  Cardiovascular: Normal rate, regular rhythm, normal heart sounds and intact distal pulses.  Exam reveals no gallop and no friction rub.   No murmur heard. Pulmonary/Chest: Effort normal and breath sounds normal. No stridor. No respiratory distress. He has no wheezes. He has no rales. He exhibits no tenderness.  Abdominal: Soft. Bowel sounds are normal. He exhibits no distension and no mass. There is no tenderness. There is no rebound and no guarding.  Musculoskeletal: Normal range of motion. He exhibits no edema and no tenderness.  Lymphadenopathy:    He has no cervical adenopathy.  Neurological: He is oriented to person, place, and time.  Skin: Skin is warm and dry. No rash noted. He is not diaphoretic. No erythema. No pallor.    Lab Results  Component Value Date   WBC 5.9 08/18/2012   HGB 14.7 08/18/2012   HCT 43.1 08/18/2012   PLT 209.0 08/18/2012   GLUCOSE 88 02/14/2014   CHOL 224* 08/01/2013   TRIG 154* 02/14/2014   HDL 55.80 08/01/2013   LDLDIRECT 93.2 08/01/2013   LDLCALC 87 02/14/2014   ALT 20 08/18/2012   ALT 20 08/18/2012   AST 21 08/18/2012   AST 21 08/18/2012   NA 134* 02/14/2014   K 4.3 02/14/2014   CL 100 02/14/2014   CREATININE 1.1 02/14/2014   BUN 11 02/14/2014   CO2 26 02/14/2014   TSH 1.99 08/18/2012        Assessment & Plan:

## 2014-10-19 NOTE — Patient Instructions (Signed)

## 2014-10-19 NOTE — Progress Notes (Signed)
Pre visit review using our clinic review tool, if applicable. No additional management support is needed unless otherwise documented below in the visit note. 

## 2014-10-19 NOTE — Assessment & Plan Note (Signed)
Improvement noted Will recheck his FLP and liver enzymes

## 2014-10-24 ENCOUNTER — Encounter: Payer: Self-pay | Admitting: Internal Medicine

## 2014-10-24 ENCOUNTER — Other Ambulatory Visit (INDEPENDENT_AMBULATORY_CARE_PROVIDER_SITE_OTHER): Payer: BC Managed Care – PPO

## 2014-10-24 DIAGNOSIS — E782 Mixed hyperlipidemia: Secondary | ICD-10-CM

## 2014-10-24 LAB — LIPID PANEL
CHOL/HDL RATIO: 5
Cholesterol: 216 mg/dL — ABNORMAL HIGH (ref 0–200)
HDL: 47.2 mg/dL (ref >39.00)
LDL Cholesterol: 129 mg/dL — ABNORMAL HIGH (ref 0–99)
NONHDL: 168.8
Triglycerides: 197 mg/dL — ABNORMAL HIGH (ref 0.0–149.0)
VLDL: 39.4 mg/dL (ref 0.0–40.0)

## 2014-10-24 LAB — COMPREHENSIVE METABOLIC PANEL
ALK PHOS: 44 U/L (ref 39–117)
ALT: 20 U/L (ref 0–53)
AST: 22 U/L (ref 0–37)
Albumin: 3.5 g/dL (ref 3.5–5.2)
BILIRUBIN TOTAL: 0.5 mg/dL (ref 0.2–1.2)
BUN: 16 mg/dL (ref 6–23)
CO2: 27 meq/L (ref 19–32)
CREATININE: 1.3 mg/dL (ref 0.4–1.5)
Calcium: 9 mg/dL (ref 8.4–10.5)
Chloride: 101 mEq/L (ref 96–112)
GFR: 64.72 mL/min (ref >60.00)
GLUCOSE: 75 mg/dL (ref 70–99)
Potassium: 3.9 mEq/L (ref 3.5–5.1)
Sodium: 135 mEq/L (ref 135–145)
Total Protein: 7.2 g/dL (ref 6.0–8.3)

## 2015-01-30 ENCOUNTER — Other Ambulatory Visit (INDEPENDENT_AMBULATORY_CARE_PROVIDER_SITE_OTHER): Payer: BLUE CROSS/BLUE SHIELD

## 2015-01-30 ENCOUNTER — Ambulatory Visit (INDEPENDENT_AMBULATORY_CARE_PROVIDER_SITE_OTHER): Payer: BLUE CROSS/BLUE SHIELD | Admitting: Internal Medicine

## 2015-01-30 VITALS — BP 122/80 | HR 63 | Temp 98.1°F | Resp 16 | Ht 71.0 in | Wt 174.0 lb

## 2015-01-30 DIAGNOSIS — F329 Major depressive disorder, single episode, unspecified: Secondary | ICD-10-CM

## 2015-01-30 DIAGNOSIS — F32A Depression, unspecified: Secondary | ICD-10-CM

## 2015-01-30 DIAGNOSIS — R5383 Other fatigue: Secondary | ICD-10-CM | POA: Insufficient documentation

## 2015-01-30 DIAGNOSIS — E291 Testicular hypofunction: Secondary | ICD-10-CM

## 2015-01-30 DIAGNOSIS — S46019A Strain of muscle(s) and tendon(s) of the rotator cuff of unspecified shoulder, initial encounter: Secondary | ICD-10-CM | POA: Insufficient documentation

## 2015-01-30 DIAGNOSIS — S46012A Strain of muscle(s) and tendon(s) of the rotator cuff of left shoulder, initial encounter: Secondary | ICD-10-CM

## 2015-01-30 LAB — COMPREHENSIVE METABOLIC PANEL
ALT: 20 U/L (ref 0–53)
AST: 22 U/L (ref 0–37)
Albumin: 4.4 g/dL (ref 3.5–5.2)
Alkaline Phosphatase: 55 U/L (ref 39–117)
BUN: 17 mg/dL (ref 6–23)
CHLORIDE: 99 meq/L (ref 96–112)
CO2: 29 meq/L (ref 19–32)
CREATININE: 1.16 mg/dL (ref 0.40–1.50)
Calcium: 9.5 mg/dL (ref 8.4–10.5)
GFR: 71.77 mL/min (ref >60.00)
Glucose, Bld: 88 mg/dL (ref 70–99)
Potassium: 4.1 mEq/L (ref 3.5–5.1)
SODIUM: 134 meq/L — AB (ref 135–145)
Total Bilirubin: 0.4 mg/dL (ref 0.2–1.2)
Total Protein: 7.4 g/dL (ref 6.0–8.3)

## 2015-01-30 LAB — CBC WITH DIFFERENTIAL/PLATELET
Basophils Absolute: 0 10*3/uL (ref 0.0–0.1)
Basophils Relative: 0.6 % (ref 0.0–3.0)
EOS PCT: 1.8 % (ref 0.0–5.0)
Eosinophils Absolute: 0.1 10*3/uL (ref 0.0–0.7)
HCT: 41 % (ref 39.0–52.0)
Hemoglobin: 14.4 g/dL (ref 13.0–17.0)
LYMPHS ABS: 2.1 10*3/uL (ref 0.7–4.0)
LYMPHS PCT: 38.2 % (ref 12.0–46.0)
MCHC: 35 g/dL (ref 30.0–36.0)
MCV: 90.2 fl (ref 78.0–100.0)
MONO ABS: 0.4 10*3/uL (ref 0.1–1.0)
Monocytes Relative: 7.6 % (ref 3.0–12.0)
NEUTROS ABS: 2.8 10*3/uL (ref 1.4–7.7)
Neutrophils Relative %: 51.8 % (ref 43.0–77.0)
Platelets: 228 10*3/uL (ref 150.0–400.0)
RBC: 4.55 Mil/uL (ref 4.22–5.81)
RDW: 13.2 % (ref 11.5–15.5)
WBC: 5.5 10*3/uL (ref 4.0–10.5)

## 2015-01-30 LAB — TSH: TSH: 1.72 u[IU]/mL (ref 0.35–4.50)

## 2015-01-30 MED ORDER — CITALOPRAM HYDROBROMIDE 10 MG PO TABS: 10.0000 mg | ORAL_TABLET | Freq: Every day | ORAL | Status: DC

## 2015-01-30 MED ORDER — INDOMETHACIN 40 MG PO CAPS: 1.0000 | ORAL_CAPSULE | Freq: Two times a day (BID) | ORAL | Status: DC

## 2015-01-30 NOTE — Progress Notes (Signed)
Pre visit review using our clinic review tool, if applicable. No additional management support is needed unless otherwise documented below in the visit note. 

## 2015-01-30 NOTE — Patient Instructions (Signed)
Rotator Cuff Tendinitis  Rotator cuff tendinitis is inflammation of the tough, cord-like bands that connect muscle to bone (tendons) in your rotator cuff. Your rotator cuff is the collection of all the muscles and tendons that connect your arm to your shoulder. Your rotator cuff holds the head of your upper arm bone (humerus) in the cup (fossa) of your shoulder blade (scapula). CAUSES Rotator cuff tendinitis is usually caused by overusing the joint involved.  SIGNS AND SYMPTOMS  Deep ache in the shoulder also felt on the outside upper arm over the shoulder muscle.  Point tenderness over the area that is injured.  Pain comes on gradually and becomes worse with lifting the arm to the side (abduction) or turning it inward (internal rotation).  May lead to a chronic tear: When a rotator cuff tendon becomes inflamed, it runs the risk of losing its blood supply, causing some tendon fibers to die. This increases the risk that the tendon can fray and partially or completely tear. DIAGNOSIS Rotator cuff tendinitis is diagnosed by taking a medical history, performing a physical exam, and reviewing results of imaging exams. The medical history is useful to help determine the type of rotator cuff injury. The physical exam will include looking at the injured shoulder, feeling the injured area, and watching you do range-of-motion exercises. X-ray exams are typically done to rule out other causes of shoulder pain, such as fractures. MRI is the imaging exam usually used for significant shoulder injuries. Sometimes a dye study called CT arthrogram is done, but it is not as widely used as MRI. In some institutions, special ultrasound tests may also be used to aid in the diagnosis. TREATMENT  Less Severe Cases  Use of a sling to rest the shoulder for a short period of time. Prolonged use of the sling can cause stiffness, weakness, and loss of motion of the shoulder joint.  Anti-inflammatory medicines, such as  ibuprofen or naproxen sodium, may be prescribed. More Severe Cases  Physical therapy.  Use of steroid injections into the shoulder joint.  Surgery. HOME CARE INSTRUCTIONS   Use a sling or splint until the pain decreases. Prolonged use of the sling can cause stiffness, weakness, and loss of motion of the shoulder joint.  Apply ice to the injured area:  Put ice in a plastic bag.  Place a towel between your skin and the bag.  Leave the ice on for 20 minutes, 2-3 times a day.  Try to avoid use other than gentle range of motion while your shoulder is painful. Use the shoulder and exercise only as directed by your health care provider. Stop exercises or range of motion if pain or discomfort increases, unless directed otherwise by your health care provider.  Only take over-the-counter or prescription medicines for pain, discomfort, or fever as directed by your health care provider.  If you were given a shoulder sling and straps (immobilizer), do not remove it except as directed, or until you see a health care provider for a follow-up exam. If you need to remove it, move your arm as little as possible or as directed.  You may want to sleep on several pillows at night to lessen swelling and pain. SEEK IMMEDIATE MEDICAL CARE IF:   Your shoulder pain increases or new pain develops in your arm, hand, or fingers and is not relieved with medicines.  You have new, unexplained symptoms, especially increased numbness in the hands or loss of strength.  You develop any worsening of the problems   that brought you in for care.  Your arm, hand, or fingers are numb or tingling.  Your arm, hand, or fingers are swollen, painful, or turn white or blue. MAKE SURE YOU:  Understand these instructions.  Will watch your condition.  Will get help right away if you are not doing well or get worse. Document Released: 03/05/2004 Document Revised: 10/04/2013 Document Reviewed: 07/26/2013 [employer] Patient  Information 2015 [employer]. This information is not intended to replace advice given to you by your health care provider. Make sure you discuss any questions you have with your health care provider.  

## 2015-01-31 ENCOUNTER — Encounter: Payer: Self-pay | Admitting: Internal Medicine

## 2015-01-31 DIAGNOSIS — E291 Testicular hypofunction: Secondary | ICD-10-CM | POA: Insufficient documentation

## 2015-01-31 LAB — TESTOSTERONE, FREE, TOTAL, SHBG
Sex Hormone Binding: 30 nmol/L (ref 10–50)
TESTOSTERONE FREE: 50.4 pg/mL (ref 47.0–244.0)
Testosterone-% Free: 2 % (ref 1.6–2.9)
Testosterone: 247 ng/dL — ABNORMAL LOW (ref 300–890)

## 2015-01-31 NOTE — Assessment & Plan Note (Signed)
This is a new diagnosis for him He has been informed of the result today Will consider treatment options

## 2015-01-31 NOTE — Assessment & Plan Note (Signed)
He will ice and rest Will start nsaids and will refer to PT If he does not improve will consider MRI and/or referral for further evaluation

## 2015-01-31 NOTE — Assessment & Plan Note (Signed)
Exam and labs are consistent with hypogonadism with a low T He will consider treating this

## 2015-01-31 NOTE — Progress Notes (Signed)
Subjective:    Patient ID: Angel Combs, male    DOB: 02/05/1966, 49 y.o.   MRN: [id number]  HPI Comments: He complains of recurrent left shoulder pain, this gets worse after he does a wt lifting routine, there has not been any recent trauma or injury.  Shoulder Pain  The pain is present in the left shoulder. This is a recurrent problem. The current episode started more than 1 month ago. The problem occurs intermittently. The problem has been unchanged. The quality of the pain is described as aching. The pain is at a severity of 2/10. The pain is mild. Associated symptoms include a limited range of motion. Pertinent negatives include no fever, inability to bear weight, itching, joint locking, joint swelling, numbness, stiffness or tingling. The symptoms are aggravated by activity. He has tried nothing for the symptoms. The treatment provided no relief.      Review of Systems  Constitutional: Positive for fatigue. Negative for fever, chills, diaphoresis, activity change, appetite change and unexpected weight change.       He complains of loss of muscle mass, generalized weakness, fatigue, and low libido.  HENT: Negative.   Eyes: Negative.   Respiratory: Negative.  Negative for cough, choking, chest tightness, shortness of breath and stridor.   Cardiovascular: Negative.  Negative for chest pain, palpitations and leg swelling.  Gastrointestinal: Negative.  Negative for nausea, vomiting, abdominal pain, diarrhea, constipation and blood in stool.  Endocrine: Negative.   Genitourinary: Negative.  Negative for dysuria and hematuria.  Musculoskeletal: Positive for arthralgias (left shoulder only). Negative for myalgias, back pain, joint swelling, stiffness and neck pain.  Skin: Negative.  Negative for itching and rash.  Allergic/Immunologic: Negative.   Neurological: Negative.  Negative for dizziness, tingling and numbness.  Hematological: Negative.  Negative for adenopathy. Does not bruise/bleed  easily.  Psychiatric/Behavioral: Negative.        Objective:   Physical Exam  Constitutional: He is oriented to person, place, and time. He appears well-developed and well-nourished. No distress.  HENT:  Head: Normocephalic and atraumatic.  Mouth/Throat: Oropharynx is clear and moist. No oropharyngeal exudate.  Eyes: Conjunctivae are normal. Right eye exhibits no discharge. Left eye exhibits no discharge. No scleral icterus.  Neck: Normal range of motion. Neck supple. No JVD present. No tracheal deviation present. No thyromegaly present.  Cardiovascular: Normal rate, regular rhythm, normal heart sounds and intact distal pulses.  Exam reveals no gallop and no friction rub.   No murmur heard. Pulmonary/Chest: Effort normal and breath sounds normal. No stridor. No respiratory distress. He has no wheezes. He has no rales. He exhibits no tenderness.  Abdominal: Soft. Bowel sounds are normal. He exhibits no distension and no mass. There is no tenderness. There is no rebound and no guarding.  Musculoskeletal: He exhibits no edema or tenderness.       Left shoulder: He exhibits decreased range of motion. He exhibits no bony tenderness, no swelling, no effusion, no crepitus, no deformity, no laceration, no pain, no spasm, normal pulse and normal strength. Tenderness: there is diffuse ttp over the anterior delt area.  Lymphadenopathy:    He has no cervical adenopathy.  Neurological: He is oriented to person, place, and time.  Skin: Skin is warm and dry. No rash noted. He is not diaphoretic. No erythema. No pallor.  Psychiatric: He has a normal mood and affect. His behavior is normal. Judgment and thought content normal.  Vitals reviewed.    Lab Results  Component Value Date  WBC 5.5 01/30/2015   HGB 14.4 01/30/2015   HCT 41.0 01/30/2015   PLT 228.0 01/30/2015   GLUCOSE 88 01/30/2015   CHOL 216* 10/24/2014   TRIG 197.0* 10/24/2014   HDL 47.20 10/24/2014   LDLDIRECT 93.2 08/01/2013    LDLCALC 129* 10/24/2014   ALT 20 01/30/2015   AST 22 01/30/2015   NA 134* 01/30/2015   K 4.1 01/30/2015   CL 99 01/30/2015   CREATININE 1.16 01/30/2015   BUN 17 01/30/2015   CO2 29 01/30/2015   TSH 1.72 01/30/2015       Assessment & Plan:

## 2015-02-06 ENCOUNTER — Ambulatory Visit: Payer: BLUE CROSS/BLUE SHIELD | Attending: Internal Medicine | Admitting: Physical Therapy

## 2015-02-06 DIAGNOSIS — M25512 Pain in left shoulder: Secondary | ICD-10-CM | POA: Diagnosis not present

## 2015-02-07 ENCOUNTER — Encounter: Payer: Self-pay | Admitting: Internal Medicine

## 2015-02-11 ENCOUNTER — Ambulatory Visit: Payer: BLUE CROSS/BLUE SHIELD | Admitting: Physical Therapy

## 2015-02-19 ENCOUNTER — Ambulatory Visit: Payer: BLUE CROSS/BLUE SHIELD

## 2015-02-19 DIAGNOSIS — R6889 Other general symptoms and signs: Secondary | ICD-10-CM

## 2015-02-19 DIAGNOSIS — R29898 Other symptoms and signs involving the musculoskeletal system: Secondary | ICD-10-CM

## 2015-02-19 DIAGNOSIS — M25512 Pain in left shoulder: Secondary | ICD-10-CM | POA: Diagnosis not present

## 2015-02-19 NOTE — Patient Instructions (Addendum)
IONTOPHORESIS PATIENT PRECAUTIONS & CONTRAINDICATIONS:  . Redness under one or both electrodes can occur.  This characterized by a uniform redness that usually disappears within 12 hours of treatment. . Small pinhead size blisters may result in response to the drug.  Contact your physician if the problem persists more than 24 hours. . On rare occasions, iontophoresis therapy can result in temporary skin reactions such as rash, inflammation, irritation or burns.  The skin reactions may be the result of individual sensitivity to the ionic solution used, the condition of the skin at the start of treatment, reaction to the materials in the electrodes, allergies or sensitivity to dexamethasone, or a poor connection between the patch and your skin.  Discontinue using iontophoresis if you have any of these reactions and report to your therapist. . Remove the Patch or electrodes if you have any undue sensation of pain or burning during the treatment and report discomfort to your therapist. . Tell your Therapist if you have had known adverse reactions to the application of electrical current. . If using the Patch, the LED light will turn off when treatment is complete and the patch can be removed.  Approximate treatment time is 1-3 hours.  Remove the patch when light goes off or after 6 hours. . The Patch can be worn during normal activity, however excessive motion where the electrodes have been placed can cause poor contact between the skin and the electrode or uneven electrical current resulting in greater risk of skin irritation. [name]. Keep out of the reach of children.   . DO NOT use if you have a cardiac pacemaker or any other electrically sensitive implanted device. . DO NOT use if you have a known sensitivity to dexamethasone. . DO NOT use during Magnetic Resonance Imaging (MRI). . DO NOT use over broken or compromised skin (e.g. sunburn, cuts, or acne) due to the increased risk of skin reaction. . DO  NOT SHAVE over the area to be treated:  To establish good contact between the Patch and the skin, excessive hair may be clipped. . DO NOT place the Patch or electrodes on or over your eyes, directly over your heart, or brain. . DO NOT reuse the Patch or electrodes as this may cause burns to occur. SHOULDER: Diagonal - Up and Away (Band)   Start with arm down and across body. Holding band, pull up and out. End with elbow straight and palm forward. Hold _2-3__ seconds. Use __red______ band. _10__ reps per set, 3___ sets per day, __5_ days per week. ON YOUR BACK: MAKE AN X ACROSS YOUR BODY  Copyright  VHI. All rights reserved.  Reverse Fly / Shoulder Retraction   Extend both arms in front of body at shoulder height, palms down, holding band. Move arms out to sides, squeeze shoulder blades together. Repeat _10_ times. Do _3__ sessions per day.  ON YOUR BACK.    Copyright  VHI. All rights reserved.

## 2015-02-19 NOTE — Therapy (Signed)
[facility] [address], [address] [place], [place], [address] Phone: [phone]   Fax:  [phone]  Physical Therapy Treatment  Patient Details  Name: Angel Combs MRN: [id number] Date of Birth: December 25, 1966 Referring Provider:  Etta GrandchildJones, Gregary L, MD  Encounter Date: 02/19/2015      PT End of Session - 02/19/15 0838    Visit Number 2   Date for PT Re-Evaluation 03/29/15   PT Start Time 0805   PT Stop Time 0844   PT Time Calculation (min) 39 min   Activity Tolerance Patient tolerated treatment well   Behavior During Therapy [facility] for tasks assessed/performed      Past Medical History  Diagnosis Date  . ALLERGIC RHINITIS CAUSE UNSPECIFIED   . LACTOSE INTOLERANCE   . PATELLO-FEMORAL SYNDROME     sees chiropractor with good results using exercises.  . Anxiety     using celxa.  . Left shoulder pain July '13    had tried accupuncture.   . Pain of left heel     Past Surgical History  Procedure Laterality Date  . Nose surgery  1991  . Lasik  Augu 22, 2013    There were no vitals taken for this visit.  Visit Diagnosis:  Pain in joint, shoulder region, left  Activity intolerance  Weakness of shoulder      Subjective Assessment - 02/19/15 0809    Symptoms Exercises are going well.  No adverse reaction to ionto patch last session   Currently in Pain? Yes   Pain Score 5    Pain Location Shoulder   Pain Orientation Left   Pain Descriptors / Indicators Sharp   Pain Type Chronic pain   Pain Onset More than a month ago   Pain Frequency Intermittent   Aggravating Factors  sleeping on the Lt shoulder   Pain Relieving Factors Aspirin   Effect of Pain on Daily Activities Limited with exercise   Multiple Pain Sites No          [facility] PT Assessment - 02/19/15 0001    Assessment   Medical Diagnosis rotator cuff strain, Lt, initial encounter   Precautions   Precautions None   Balance Screen   Has the patient fallen  in the past 6 months No   Has the patient had a decrease in activity level because of a fear of falling?  No   Is the patient reluctant to leave their home because of a fear of falling?  No   Home Environment   Living Enviornment Private residence   Prior Function   Level of Independence Independent with basic ADLs                  [facility] Adult PT Treatment/Exercise - 02/19/15 0001    Exercises   Exercises Shoulder   Shoulder Exercises: Supine   Horizontal ABduction Strengthening;Both;20 reps   Theraband Level (Shoulder Horizontal ABduction) Level 2 (Red)   Other Supine Exercises supine on foam roll x 3 minutes for pec stretch   Other Supine Exercises D2 with Red band in supine: 2x10   Shoulder Exercises: Standing   Shoulder Elevation AAROM;10 reps  finger ladder, flexion    Shoulder Exercises: Pulleys   Flexion 3 minutes   Shoulder Exercises: ROM/Strengthening   UBE (Upper Arm Bike) Level 1x 6 minutes (3/3)   Other ROM/Strengthening Exercises supine cane flexion: review of HEP x 10   Modalities   Modalities Iontophoresis   Iontophoresis  Type of Iontophoresis Dexamethasone   Location Lt rotator cuff   Dose 1.0 CC   Time 6 hour wear                PT Education - 02/19/15 0826    Education provided Yes   Education Details ionto patch, supine theraband   Person(s) Educated Patient   Methods Explanation;Handout;Demonstration;Tactile cues   Comprehension Verbalized understanding          PT Short Term Goals - 02/19/15 0819    PT SHORT TERM GOAL #1   Title be independent with initial HEP for range of motion exercises   Time 4   Period Weeks   Status On-going   PT SHORT TERM GOAL #2   Title sleep with waking up 2 or more times a night   Time 4   Period Weeks   Status Achieved   PT SHORT TERM GOAL #3   Title put  a jacket on with moderate pain   Time 4   Period Weeks   Status On-going   PT SHORT TERM GOAL #4   Title reach in an overhead shelf  with minimal pain   Time 4   Period Weeks   Status On-going           PT Long Term Goals - 02/19/15 0816    PT LONG TERM GOAL #1   Title be independent with RICE education   Time 8   Period Weeks   Status On-going   PT LONG TERM GOAL #2   Title be independnet with advanced HEP   Time 8   Period Weeks   Status On-going   PT LONG TERM GOAL #3   Title return to gym workout with minimal discomfort   Time 8   Period Weeks   Status On-going  Pt is limited with UE weights and movements overhead   PT LONG TERM GOAL #4   Title put a jacked on without difficulty   Time 8   Period Weeks   Status On-going  Pt is modifying this activity and reports pain with this   PT LONG TERM GOAL #5   Title sleep without waking up   Time 8   Period Weeks   Status On-going  using 2 pillows now but still waking occasionally   Additional Long Term Goals   Additional Long Term Goals Yes   PT LONG TERM GOAL #6   Title reach in an overhead shelf with no pain   Time 8   Period Weeks   Status On-going  Pt with pain with reaching overhead: up to 5/10               Plan - 02/19/15 0841    Clinical Impression Statement Pt with only 1 session since evaluation.  See goals for assessment.     Pt will benefit from skilled therapeutic intervention in order to improve on the following deficits Decreased endurance;Decreased activity tolerance;Pain;Decreased strength   Rehab Potential Good   Clinical Impairments Affecting Rehab Potential none   PT Frequency 2x / week   PT Duration 8 weeks   PT Treatment/Interventions Moist Heat;Therapeutic activities;Patient/family education;Electrical Stimulation;Cryotherapy;Neuromuscular re-education;Manual techniques;Ultrasound  iontophoresis with dexamethasone   PT Next Visit Plan review HEP, continue ionto, [place]   Consulted and Agree with Plan of Care Patient        Problem List Patient Active Problem List   Diagnosis Date Noted  . Hypogonadism in  male 01/31/2015  . Other  fatigue 01/30/2015  . Rotator cuff strain 01/30/2015  . Controlled depression 10/19/2014  . Elevated cholesterol with elevated triglycerides 08/19/2012  . Routine health maintenance 08/19/2012  . Allergic rhinitis 02/12/2009    Lavinia Mcneely, PT 02/19/2015, 8:48 AM  [facility] [address] [place], [place], [address] Phone: [phone]   Fax:  [phone]

## 2015-02-27 ENCOUNTER — Ambulatory Visit: Payer: BLUE CROSS/BLUE SHIELD | Attending: Internal Medicine

## 2015-02-27 DIAGNOSIS — M25512 Pain in left shoulder: Secondary | ICD-10-CM

## 2015-02-27 DIAGNOSIS — R6889 Other general symptoms and signs: Secondary | ICD-10-CM

## 2015-02-27 DIAGNOSIS — R29898 Other symptoms and signs involving the musculoskeletal system: Secondary | ICD-10-CM

## 2015-02-27 NOTE — Therapy (Addendum)
[facility] [address] [place], [place], [address] Phone: [phone]   Fax:  [phone]  Physical Therapy Treatment  Patient Details  Name: Angel Combs MRN: [id number] Date of Birth: [birth year] Referring Provider:  Janith Lima, MD  Encounter Date: 02/27/2015      PT End of Session - 02/27/15 0844    Visit Number 3   Date for PT Re-Evaluation 03/29/15   PT Start Time 0805   PT Stop Time 0843   PT Time Calculation (min) 38 min   Activity Tolerance Patient tolerated treatment well   Behavior During Therapy [facility] for tasks assessed/performed      Past Medical History  Diagnosis Date  . ALLERGIC RHINITIS CAUSE UNSPECIFIED   . LACTOSE INTOLERANCE   . PATELLO-FEMORAL SYNDROME     sees chiropractor with good results using exercises.  . Anxiety     using celxa.  . Left shoulder pain July '13    had tried accupuncture.   . Pain of left heel     Past Surgical History  Procedure Laterality Date  . Nose surgery  1991  . Lasik  Augu 22, 2013    There were no vitals taken for this visit.  Visit Diagnosis:  Pain in joint, shoulder region, left  Weakness of shoulder  Activity intolerance      Subjective Assessment - 02/27/15 0805    Symptoms Lt shoulder pain   Currently in Pain? Yes   Pain Score --  0-6/10 range over the past week   Pain Location Shoulder   Pain Orientation Left   Pain Descriptors / Indicators Sharp   Pain Type Chronic pain   Pain Onset More than a month ago   Pain Frequency Intermittent   Aggravating Factors  sleep at night, after lifting heavy weights at the gym   Pain Relieving Factors Aspirin   Effect of Pain on Daily Activities limited sleep and ability to perform gym exercises without modification   Multiple Pain Sites No          [facility] PT Assessment - 02/27/15 0001    Assessment   Medical Diagnosis rotator cuff strain, Lt, initial encounter                   [facility] Adult PT Treatment/Exercise - 02/27/15 0001    Shoulder Exercises: Supine   Horizontal ABduction Strengthening;Both;20 reps   Theraband Level (Shoulder Horizontal ABduction) Level 2 (Red)  Good return demo of HEP   Other Supine Exercises supine on foam roll x 3 minutes for pec stretch   Other Supine Exercises D2 with Red band in supine: 2x10   Shoulder Exercises: Standing   Shoulder Elevation AAROM;10 reps  finger ladder, flexion added 1#    Shoulder Exercises: Pulleys   Flexion 3 minutes   Shoulder Exercises: ROM/Strengthening   UBE (Upper Arm Bike) Level 1x 6 minutes (3/3)   Modalities   Modalities Ultrasound   Ultrasound   Ultrasound Location Lt shoulder/lateral deltoid   Ultrasound Parameters 1.3 w/cm 50% pulsed x 8 minutes   Ultrasound Goals Pain   Iontophoresis   Type of Iontophoresis Dexamethasone   Location Lt rotator cuff   Dose 1.0 CC   Time 6 hour wear                PT Education - 02/27/15 0814    Education provided Yes   Education Details ionto patch   Person(s) Educated Patient   Methods  Explanation;Handout   Comprehension Verbalized understanding          PT Short Term Goals - 02/27/15 7867    PT SHORT TERM GOAL #1   Title be independent with initial HEP for range of motion exercises   Period Weeks   Status Achieved  independent and compliant with HEP   PT SHORT TERM GOAL #2   Title sleep with waking up 2 or more times a night   Time 4   Period Weeks   Status Achieved  waking 1-2 times   PT SHORT TERM GOAL #3   Title put  a jacket on with moderate pain   Time 4   Period Weeks   Status Achieved  mimimal pain reported   PT SHORT TERM GOAL #4   Title reach in an overhead shelf with minimal pain   Time 4   Period Weeks   Status Achieved  1-2 Lt shoulder pain reported           PT Long Term Goals - 02/19/15 0816    PT LONG TERM GOAL #1   Title be independent with RICE education   Time 8   Period Weeks   Status  On-going   PT LONG TERM GOAL #2   Title be independnet with advanced HEP   Time 8   Period Weeks   Status On-going   PT LONG TERM GOAL #3   Title return to gym workout with minimal discomfort   Time 8   Period Weeks   Status On-going  Pt is limited with UE weights and movements overhead   PT LONG TERM GOAL #4   Title put a jacked on without difficulty   Time 8   Period Weeks   Status On-going  Pt is modifying this activity and reports pain with this   PT LONG TERM GOAL #5   Title sleep without waking up   Time 8   Period Weeks   Status On-going  using 2 pillows now but still waking occasionally   Additional Long Term Goals   Additional Long Term Goals Yes   PT LONG TERM GOAL #6   Title reach in an overhead shelf with no pain   Time 8   Period Weeks   Status On-going  Pt with pain with reaching overhead: up to 5/10               Plan - 02/27/15 0825    Clinical Impression Statement Pt has met all STGs and is reporting Lt shoulder pain only with sleep at night and after working out at the gym.   Pt will benefit from skilled therapeutic intervention in order to improve on the following deficits Decreased endurance;Decreased activity tolerance;Pain;Decreased strength   Rehab Potential Good   PT Frequency 2x / week   PT Duration 8 weeks   PT Treatment/Interventions Moist Heat;Therapeutic activities;Patient/family education;Electrical Stimulation;Cryotherapy;Neuromuscular re-education;Manual techniques;Ultrasound;Other (comment)  ionto with dexamethasone 38m/dl   PT Next Visit Plan review HEP, continue ionto, [place]  PT Home Exercise Plan add more scapular stabilization   Consulted and Agree with Plan of Care Patient        Problem List Patient Active Problem List   Diagnosis Date Noted  . Hypogonadism in male 01/31/2015  . Other fatigue 01/30/2015  . Rotator cuff strain 01/30/2015  . Controlled depression 10/19/2014  . Elevated cholesterol with elevated  triglycerides 08/19/2012  . Routine health maintenance 08/19/2012  . Allergic rhinitis 02/12/2009  TAKACS,KELLY 02/27/2015, 8:45 AM PHYSICAL THERAPY DISCHARGE SUMMARY  Visits from Start of Care: 3 Current functional level related to goals / functional outcomes: See above for goal assessment.  Pt didn't return after PT session 02/27/15.     Remaining deficits: Unknown as pt didn't return to PT.  Thank you for this referral.    Education / Equipment: HEP Plan: Patient agrees to discharge.  Patient goals were partially met. Patient is being discharged due to not returning since the last visit.  ?????     [facility] [address], [place], [place], [address] Phone: [phone]   Fax:  [phone]

## 2015-02-27 NOTE — Patient Instructions (Addendum)

## 2015-03-13 ENCOUNTER — Ambulatory Visit: Payer: BLUE CROSS/BLUE SHIELD | Admitting: Internal Medicine

## 2016-02-13 ENCOUNTER — Other Ambulatory Visit: Payer: Self-pay

## 2016-02-13 DIAGNOSIS — F32A Depression, unspecified: Secondary | ICD-10-CM

## 2016-02-13 DIAGNOSIS — F329 Major depressive disorder, single episode, unspecified: Secondary | ICD-10-CM

## 2016-02-13 MED ORDER — CITALOPRAM HYDROBROMIDE 10 MG PO TABS: 10.0000 mg | ORAL_TABLET | Freq: Every day | ORAL | Status: DC

## 2016-02-19 ENCOUNTER — Encounter: Payer: Self-pay | Admitting: Internal Medicine

## 2016-03-10 ENCOUNTER — Encounter: Payer: Self-pay | Admitting: Internal Medicine

## 2016-03-17 ENCOUNTER — Encounter: Payer: Self-pay | Admitting: Internal Medicine

## 2016-04-13 ENCOUNTER — Other Ambulatory Visit: Payer: Self-pay | Admitting: Chiropractic Medicine

## 2016-04-13 ENCOUNTER — Ambulatory Visit
Admission: RE | Admit: 2016-04-13 | Discharge: 2016-04-13 | Disposition: A | Discharge status: Home / Self Care | Payer: BLUE CROSS/BLUE SHIELD | Source: Ambulatory Visit | Attending: Chiropractic Medicine | Admitting: Chiropractic Medicine

## 2016-04-13 DIAGNOSIS — M25512 Pain in left shoulder: Secondary | ICD-10-CM

## 2016-04-13 IMAGING — CR DG SHOULDER 2+V*L*
3 series · 3 of 3 positions shown · non-contrast
Comparison: None.

CLINICAL DATA: Chronic left shoulder pain, worsening. No known
injury.

EXAM:
LEFT SHOULDER - 2+ VIEW

[w shoulder grashey left]
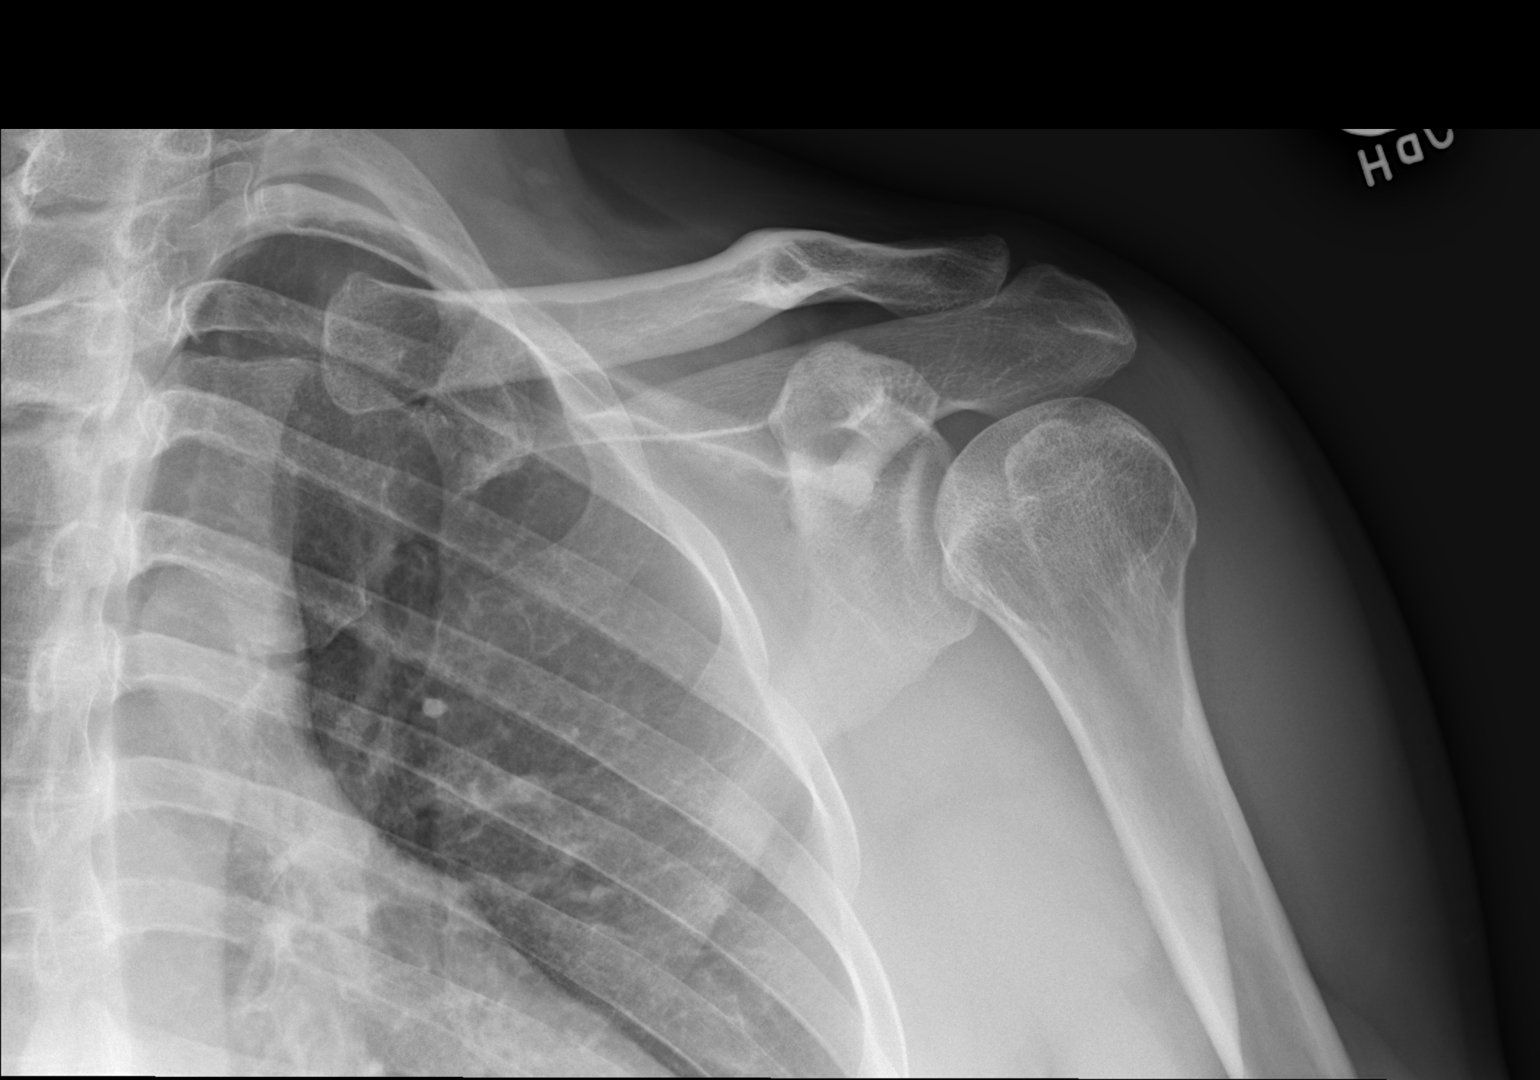

[w shoulder y-view left]
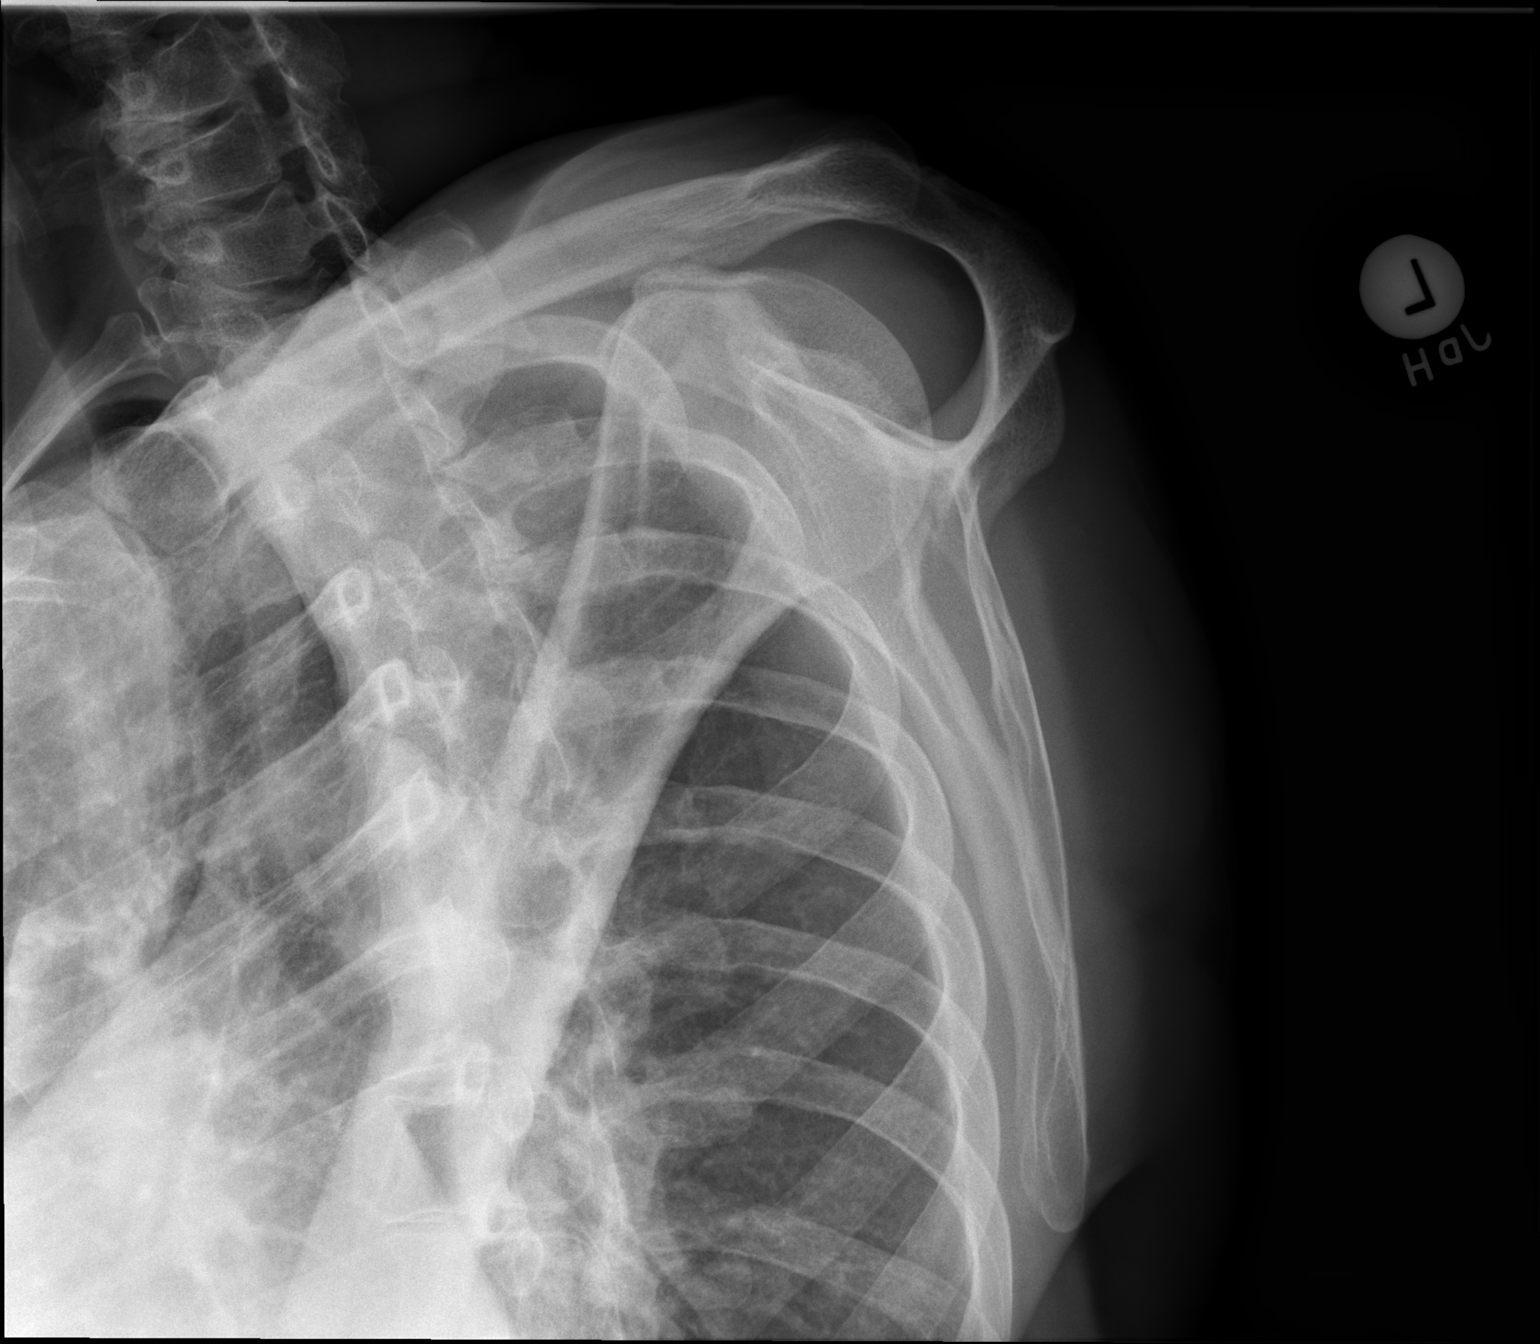

[w shoulder axillary left]
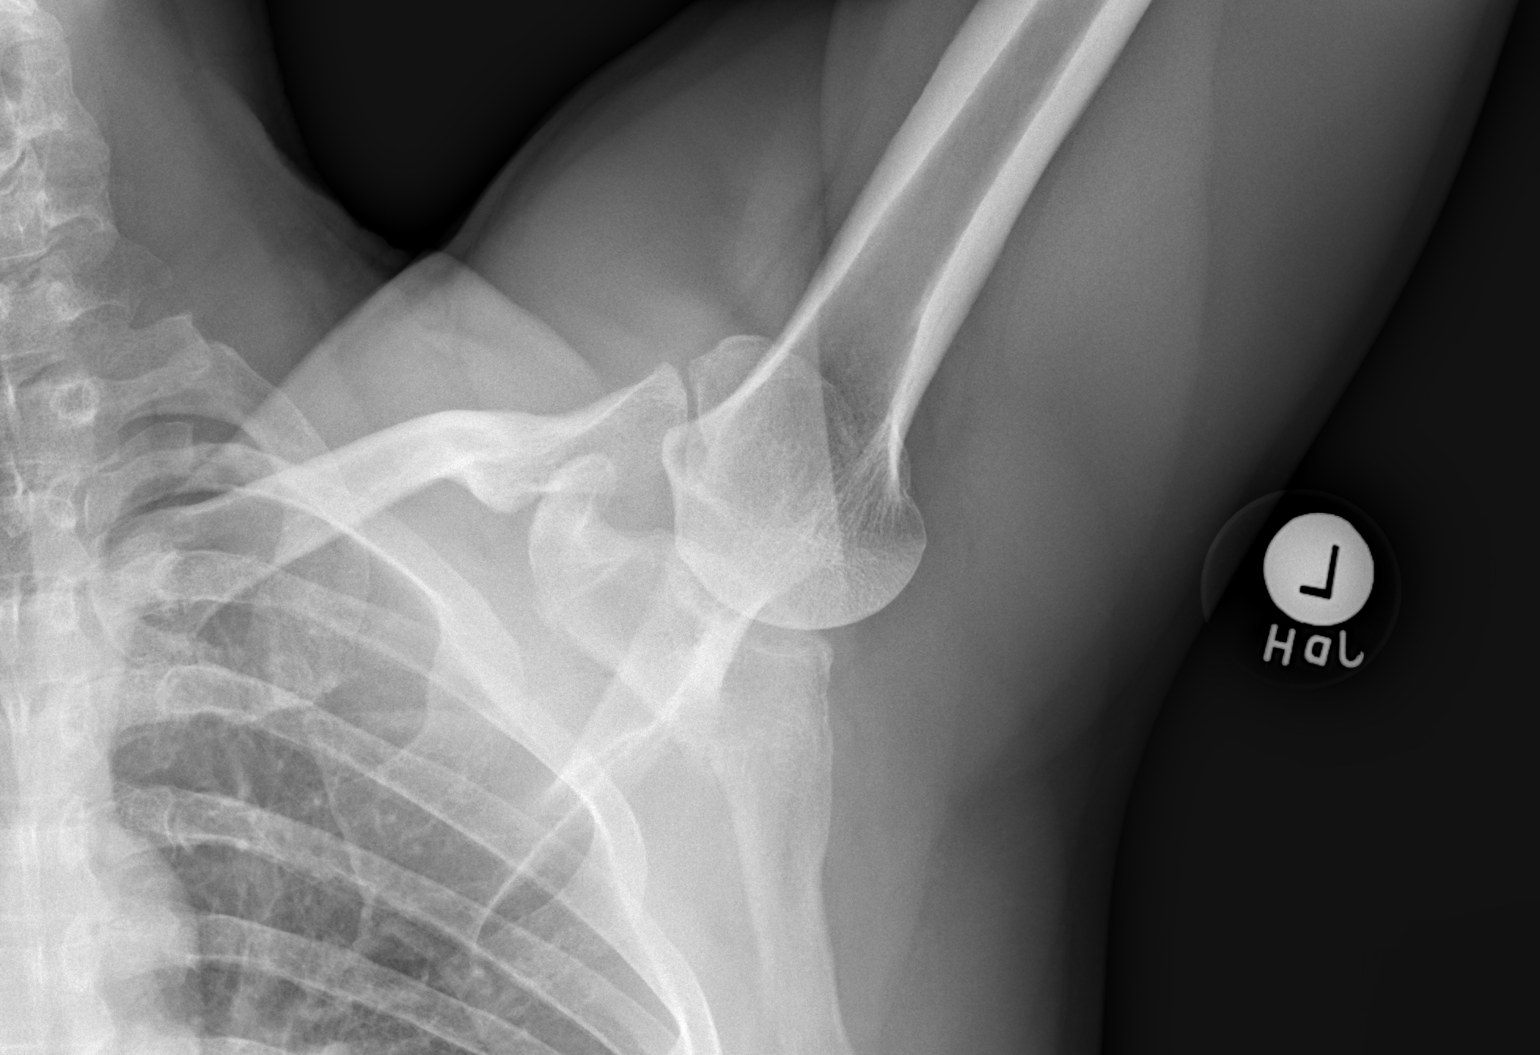

[3 of 3 positions shown; findings below may reference images not displayed]

FINDINGS: There is no evidence of fracture or dislocation. There is no
evidence of arthropathy or other focal bone abnormality. Soft
tissues are unremarkable.
IMPRESSION: Negative.

## 2016-07-30 ENCOUNTER — Ambulatory Visit: Payer: BLUE CROSS/BLUE SHIELD | Admitting: Family Medicine

## 2016-09-04 ENCOUNTER — Telehealth: Payer: Self-pay | Admitting: Emergency Medicine

## 2016-09-04 DIAGNOSIS — Z0000 Encounter for general adult medical examination without abnormal findings: Secondary | ICD-10-CM

## 2016-09-04 DIAGNOSIS — E291 Testicular hypofunction: Secondary | ICD-10-CM

## 2016-09-04 NOTE — Telephone Encounter (Signed)
CPE labs prior to appt on 10/01/16. CPE labs pended for your review; Please advise.

## 2016-09-04 NOTE — Telephone Encounter (Signed)
Orders signed.

## 2016-09-04 NOTE — Telephone Encounter (Signed)
Pt is coming in for a CPE on 10/5. He wants to come in before appt and have his blood work done. Can you please make sure those orders get put in. Thanks.

## 2016-09-22 NOTE — Progress Notes (Signed)
[name] [address] [place], [place] [address] Phone: [phone] Subjective:     BJ:YNWG shoulder pain, right heel pain    [id number]  Angel Combs is a 50 y.o. male coming in with complaint of left shoulder pain. Patient has had this for multiple months. Has even tried rehabilitation with no significant improvement. Also has tried acupuncture. Patient states Pain is been more of a chronic problem. Seems to be about 5 out of 10 in severity. Describes it as more of a sharp intermittent pain. Worse with plain on the left side. Does respond somewhat over-the-counter anti-inflammatory's. Patient states it seems to be more annoying than truly painful at this time. States that even sleep at night can be uncomfortable. Denies any radiation down the arm. States very localized. Still able to do daily activities. Continues to workout but is avoiding incline bench press secondary to pain   Also complaining of right heel pain. Patient is a fit this heel pain had been after running on the beach. Patient describes the pain as a dull, throbbing aching sensation. States that it seems to be worse now with any type of walking. Denies any swelling.   Previous x-rays taken 04/13/2016 of left shoulder were independently visualized by me showing no bony abnormality.    Past Medical History:  Diagnosis Date  . ALLERGIC RHINITIS CAUSE UNSPECIFIED   . Anxiety    using celxa.  [name] LACTOSE INTOLERANCE   . Left shoulder pain July '13   had tried accupuncture.   . Pain of left heel   . PATELLO-FEMORAL SYNDROME    sees chiropractor with good results using exercises.   Past Surgical History:  Procedure Laterality Date  . lasik  Augu 22, 2013  . NOSE SURGERY  1991   Social History   Social History  . Marital status: Single    Spouse name: N/A  . Number of children: 0  . Years of education: 63   Occupational History  . Real Chief Technology Officer    Social History Main  Topics  . Smoking status: Never Smoker  . Smokeless tobacco: Never Used  . Alcohol use 7.5 oz/week    15 drink(s) per week  . Drug use: No  . Sexual activity: Yes    Partners: Female   Other Topics Concern  . Not on file   Social History Narrative   HSG, [employer]/ROTC. BS, MBA, MA-geography, MA foreign affairs  [place], MS- MIS. 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 No regular exercise program.   No Known Allergies Family History  Problem Relation Age of Onset  . Hyperlipidemia Father   . Hypertension Father   . Coronary artery disease Father   . Diabetes Maternal Grandmother   . Coronary artery disease Other   . Arthritis Mother     back pain  . Cancer Neg Hx   . Depression Neg Hx   . Early death Neg Hx   . Hearing loss Neg Hx   . Heart disease Neg Hx   . Kidney disease Neg Hx   . Stroke Neg Hx     Past medical history, social, surgical and family history all reviewed in electronic medical record.  No pertanent information unless stated regarding to the chief complaint.   Review of Systems: No headache, visual changes, nausea, vomiting, diarrhea, constipation, dizziness, abdominal pain, skin rash, fevers,  chills, night sweats, weight loss, swollen lymph nodes, body aches, joint swelling, muscle aches, chest pain, shortness of breath, mood changes.   Objective  There were no vitals taken for this visit.  General: No apparent distress alert and oriented x3 mood and affect normal, dressed appropriately.  HEENT: Pupils equal, extraocular movements intact  Respiratory: Patient's speak in full sentences and does not appear short of breath  Cardiovascular: No lower extremity edema, non tender, no erythema  Skin: Warm dry intact with no signs of infection or rash on extremities or on axial skeleton.  Abdomen: Soft nontender  Neuro: Cranial nerves II through XII are intact,  neurovascularly intact in all extremities with 2+ DTRs and 2+ pulses.  Lymph: No lymphadenopathy of posterior or anterior cervical chain or axillae bilaterally.  Gait normal with good balance and coordination.  MSK:  Non tender with full range of motion and good stability and symmetric strength and tone of elbows, wrist, hip, knee and ankles bilaterally.  Shoulder: left Inspection reveals no abnormalities, atrophy or asymmetry. Palpation is normal with no tenderness over AC joint or bicipital groove. ROM is full in all planes passively. Rotator cuff strength normal throughout. signs of impingement with positive Neer and Hawkin's tests, but negative empty can sign. Speeds and Yergason's tests normal. No labral pathology noted with negative Obrien's, negative clunk and good stability. Normal scapular function observed. No painful arc and no drop arm sign. No apprehension sign  Right heel exam shows the patient is minimally tender at the insertion of the Achilles. No erythema or swelling noted. Full range of motion of the ankle. Neurovascularly intact distally.  Limited muscular skeletal ultrasound of patient's left heel was performed and interpreted by Judi SaaZachary M Smith  Limited ultrasound shows patient does have a calcific change of the retrocalcaneal bursitis with overlying hypoechoic changes. Mild increase in Doppler flow. Achilles tendon is intact with no significant changes. Impression: Retrocalcaneal    Impression and Recommendations:     This case required medical decision making of moderate complexity.      Note: This dictation was prepared with Dragon dictation along with smaller phrase technology. Any transcriptional errors that result from this process are unintentional.

## 2016-09-23 ENCOUNTER — Ambulatory Visit (INDEPENDENT_AMBULATORY_CARE_PROVIDER_SITE_OTHER): Payer: BLUE CROSS/BLUE SHIELD | Admitting: Family Medicine

## 2016-09-23 ENCOUNTER — Encounter: Payer: Self-pay | Admitting: Family Medicine

## 2016-09-23 ENCOUNTER — Other Ambulatory Visit: Payer: Self-pay

## 2016-09-23 VITALS — BP 108/70 | HR 60 | Wt 175.0 lb

## 2016-09-23 DIAGNOSIS — M79671 Pain in right foot: Secondary | ICD-10-CM

## 2016-09-23 DIAGNOSIS — S46012A Strain of muscle(s) and tendon(s) of the rotator cuff of left shoulder, initial encounter: Secondary | ICD-10-CM

## 2016-09-23 DIAGNOSIS — M7750 Other enthesopathy of unspecified foot: Secondary | ICD-10-CM | POA: Insufficient documentation

## 2016-09-23 DIAGNOSIS — M7751 Other enthesopathy of right foot: Secondary | ICD-10-CM | POA: Diagnosis not present

## 2016-09-23 MED ORDER — DICLOFENAC SODIUM 2 % TD SOLN: 2.0000 "application " | Freq: Two times a day (BID) | TRANSDERMAL | 3 refills | Status: DC

## 2016-09-23 MED ORDER — VITAMIN D (ERGOCALCIFEROL) 1.25 MG (50000 UNIT) PO CAPS: 50000.0000 [IU] | ORAL_CAPSULE | ORAL | 0 refills | Status: DC

## 2016-09-23 NOTE — Assessment & Plan Note (Signed)
Patient does have more of a retrocalcaneal bursitis. Likely calcific in nature. We discussed then patients taking vitamin D weekly, heel lift, home exercises and icing. Patient will come back and see me again in 3-4 weeks. If worsening symptoms we can consider injection but may need Cam Walker afterwards. I also did formal physical therapy.

## 2016-09-23 NOTE — Patient Instructions (Addendum)
Good to see you.  Ice 20 minutes 2 times daily. Usually after activity and before bed. pennsaid pinkie amount topically 2 times daily as needed. Can do on shoulder and heel.  For shoudler Exercises 3 times a week.   Keep hands within peripheral vision with lifting.  For the heel  Heel lift would be great in your shoe at all time.s pennsaid pinkie amount topically 2 times daily as needed.  Avoid running or jumping Once weekly vitamin D can help  A lot.  Ice bath for 10 minutes at night See me again in 3-4 weeks and we will see what progress we have made.

## 2016-09-23 NOTE — Assessment & Plan Note (Signed)
I do believe the patient does have more of a strain on the left side. We discussed icing regimen, home exercises, topical anti-inflammatories. Asian given exercises and work with Event organiserathletic trainer. Patient come back and see me again in 3-4 weeks. If continuing have pain we will do an ultrasound.

## 2016-09-29 ENCOUNTER — Other Ambulatory Visit (INDEPENDENT_AMBULATORY_CARE_PROVIDER_SITE_OTHER): Payer: BLUE CROSS/BLUE SHIELD

## 2016-09-29 DIAGNOSIS — Z0000 Encounter for general adult medical examination without abnormal findings: Secondary | ICD-10-CM

## 2016-09-29 DIAGNOSIS — E291 Testicular hypofunction: Secondary | ICD-10-CM

## 2016-09-29 DIAGNOSIS — R7989 Other specified abnormal findings of blood chemistry: Secondary | ICD-10-CM

## 2016-09-29 LAB — URINALYSIS, ROUTINE W REFLEX MICROSCOPIC
BILIRUBIN URINE: NEGATIVE
HGB URINE DIPSTICK: NEGATIVE
Ketones, ur: NEGATIVE
LEUKOCYTES UA: NEGATIVE
NITRITE: NEGATIVE
RBC / HPF: NONE SEEN (ref 0–?)
Specific Gravity, Urine: 1.01 (ref 1.000–1.030)
Total Protein, Urine: NEGATIVE
Urine Glucose: NEGATIVE
Urobilinogen, UA: 0.2 (ref 0.0–1.0)
WBC UA: NONE SEEN (ref 0–?)
pH: 6.5 (ref 5.0–8.0)

## 2016-09-29 LAB — CBC WITH DIFFERENTIAL/PLATELET
BASOS ABS: 0 10*3/uL (ref 0.0–0.1)
Basophils Relative: 0.7 % (ref 0.0–3.0)
EOS ABS: 0.1 10*3/uL (ref 0.0–0.7)
Eosinophils Relative: 2 % (ref 0.0–5.0)
HEMATOCRIT: 40.6 % (ref 39.0–52.0)
Hemoglobin: 14.3 g/dL (ref 13.0–17.0)
LYMPHS PCT: 36.8 % (ref 12.0–46.0)
Lymphs Abs: 1.6 10*3/uL (ref 0.7–4.0)
MCHC: 35.1 g/dL (ref 30.0–36.0)
MCV: 90.8 fl (ref 78.0–100.0)
Monocytes Absolute: 0.3 10*3/uL (ref 0.1–1.0)
Monocytes Relative: 7.6 % (ref 3.0–12.0)
NEUTROS ABS: 2.4 10*3/uL (ref 1.4–7.7)
Neutrophils Relative %: 52.9 % (ref 43.0–77.0)
PLATELETS: 226 10*3/uL (ref 150.0–400.0)
RBC: 4.48 Mil/uL (ref 4.22–5.81)
RDW: 12.8 % (ref 11.5–15.5)
WBC: 4.5 10*3/uL (ref 4.0–10.5)

## 2016-09-29 LAB — COMPREHENSIVE METABOLIC PANEL
ALBUMIN: 4.1 g/dL (ref 3.5–5.2)
ALT: 19 U/L (ref 0–53)
AST: 19 U/L (ref 0–37)
Alkaline Phosphatase: 44 U/L (ref 39–117)
BILIRUBIN TOTAL: 0.5 mg/dL (ref 0.2–1.2)
BUN: 17 mg/dL (ref 6–23)
CALCIUM: 8.8 mg/dL (ref 8.4–10.5)
CO2: 27 mEq/L (ref 19–32)
CREATININE: 1.29 mg/dL (ref 0.40–1.50)
Chloride: 98 mEq/L (ref 96–112)
GFR: 63.04 mL/min (ref >60.00)
Glucose, Bld: 89 mg/dL (ref 70–99)
Potassium: 4.3 mEq/L (ref 3.5–5.1)
Sodium: 134 mEq/L — ABNORMAL LOW (ref 135–145)
Total Protein: 7.2 g/dL (ref 6.0–8.3)

## 2016-09-29 LAB — LIPID PANEL
CHOLESTEROL: 245 mg/dL — AB (ref 0–200)
HDL: 52.2 mg/dL (ref >39.00)
NonHDL: 192.43
TRIGLYCERIDES: 208 mg/dL — AB (ref 0.0–149.0)
Total CHOL/HDL Ratio: 5
VLDL: 41.6 mg/dL — ABNORMAL HIGH (ref 0.0–40.0)

## 2016-09-29 LAB — LDL CHOLESTEROL, DIRECT: Direct LDL: 73 mg/dL

## 2016-09-29 LAB — PSA: PSA: 0.19 ng/mL (ref 0.10–4.00)

## 2016-09-30 LAB — TESTOSTERONE TOTAL,FREE,BIO, MALES
ALBUMIN: 4.4 g/dL (ref 3.6–5.1)
SEX HORMONE BINDING: 33 nmol/L (ref 10–50)
TESTOSTERONE BIOAVAILABLE: 138.5 ng/dL (ref 110.0–575.0)
TESTOSTERONE FREE: 68.8 pg/mL (ref 46.0–224.0)
TESTOSTERONE: 505 ng/dL (ref 250–827)

## 2016-10-01 ENCOUNTER — Encounter: Payer: Self-pay | Admitting: Internal Medicine

## 2016-10-01 ENCOUNTER — Ambulatory Visit (INDEPENDENT_AMBULATORY_CARE_PROVIDER_SITE_OTHER): Payer: BLUE CROSS/BLUE SHIELD | Admitting: Internal Medicine

## 2016-10-01 VITALS — BP 118/70 | HR 71 | Temp 98.2°F | Ht 71.0 in | Wt 176.0 lb

## 2016-10-01 DIAGNOSIS — Z0000 Encounter for general adult medical examination without abnormal findings: Secondary | ICD-10-CM

## 2016-10-01 DIAGNOSIS — E782 Mixed hyperlipidemia: Secondary | ICD-10-CM

## 2016-10-01 NOTE — Progress Notes (Signed)
Pre visit review using our clinic review tool, if applicable. No additional management support is needed unless otherwise documented below in the visit note. 

## 2016-10-01 NOTE — Patient Instructions (Signed)

## 2016-10-01 NOTE — Progress Notes (Signed)
Subjective:  Patient ID: Angel Combs, male    DOB: 03-06-1966  Age: 50 y.o. MRN: [id number]  CC: Annual Exam   HPI Facundo Allemand presents for a CPX - he feels well and offers no complaints.  Outpatient Medications Prior to Visit  Medication Sig Dispense Refill  . citalopram (CELEXA) 10 MG tablet Take 1 tablet (10 mg total) by mouth daily. 90 tablet 1  . Diclofenac Sodium (PENNSAID) 2 % SOLN Place 2 application onto the skin 2 (two) times daily. 112 g 3  . fluticasone (FLONASE) 50 MCG/ACT nasal spray Place 2 sprays into both nostrils daily. 16 g 2  . Omega-3 Fatty Acids (FISH OIL) 1000 MG CAPS Take by mouth daily.      . pseudoephedrine (SUDAFED) 30 MG tablet Take 30 mg by mouth every 4 (four) hours as needed.      . Vitamin D, Ergocalciferol, (DRISDOL) 50000 units CAPS capsule Take 1 capsule (50,000 Units total) by mouth every 7 (seven) days. 12 capsule 0  . Indomethacin (TIVORBEX) 40 MG CAPS Take 1 capsule by mouth 2 (two) times daily with a meal. 60 capsule 1  . loratadine (CLARITIN) 10 MG tablet Take 1 tablet (10 mg total) by mouth daily.     No facility-administered medications prior to visit.     ROS Review of Systems  Constitutional: Negative for activity change, appetite change, diaphoresis, fatigue and unexpected weight change.  HENT: Negative.   Eyes: Negative.   Respiratory: Negative.  Negative for cough, shortness of breath and wheezing.   Cardiovascular: Negative for chest pain, palpitations and leg swelling.  Gastrointestinal: Negative.  Negative for abdominal pain, constipation, diarrhea, nausea and vomiting.  Endocrine: Negative.  Negative for polydipsia, polyphagia and polyuria.  Genitourinary: Negative.  Negative for decreased urine volume, difficulty urinating, discharge, penile swelling and scrotal swelling.  Musculoskeletal: Negative.   Skin: Negative.  Negative for color change and rash.  Allergic/Immunologic: Negative.   Neurological: Negative for  dizziness, weakness and light-headedness.  Hematological: Negative.  Negative for adenopathy. Does not bruise/bleed easily.  Psychiatric/Behavioral: Negative.     Objective:  BP 118/70 (BP Location: Left Arm, Patient Position: Sitting, Cuff Size: Normal)   Pulse 71   Temp 98.2 F (36.8 C) (Oral)   Ht 5\' 11"  (1.803 m)   Wt 176 lb (79.8 kg)   SpO2 98%   BMI 24.55 kg/m   BP Readings from Last 3 Encounters:  10/01/16 118/70  09/23/16 108/70  01/30/15 122/80    Wt Readings from Last 3 Encounters:  10/01/16 176 lb (79.8 kg)  09/23/16 175 lb (79.4 kg)  01/30/15 174 lb (78.9 kg)    Physical Exam  Constitutional: He is oriented to person, place, and time. No distress.  HENT:  Mouth/Throat: Oropharynx is clear and moist. No oropharyngeal exudate.  Eyes: Conjunctivae are normal. Right eye exhibits no discharge. Left eye exhibits no discharge. No scleral icterus.  Neck: Normal range of motion. Neck supple. No JVD present. No tracheal deviation present. No thyromegaly present.  Cardiovascular: Normal rate, regular rhythm, normal heart sounds and intact distal pulses.  Exam reveals no gallop and no friction rub.   No murmur heard. Pulmonary/Chest: Effort normal and breath sounds normal. No stridor. No respiratory distress. He has no wheezes. He has no rales. He exhibits no tenderness.  Abdominal: Soft. Bowel sounds are normal. He exhibits no distension and no mass. There is no tenderness. There is no rebound and no guarding.  Genitourinary:  Genitourinary Comments: GU  and rectal exams deferred at his request  Musculoskeletal: Normal range of motion. He exhibits no edema, tenderness or deformity.  Lymphadenopathy:    He has no cervical adenopathy.  Neurological: He is oriented to person, place, and time.  Skin: Skin is warm and dry. No rash noted. He is not diaphoretic. No erythema. No pallor.  Psychiatric: He has a normal mood and affect. His behavior is normal. Judgment and thought  content normal.  Vitals reviewed.   Lab Results  Component Value Date   WBC 4.5 09/29/2016   HGB 14.3 09/29/2016   HCT 40.6 09/29/2016   PLT 226.0 09/29/2016   GLUCOSE 89 09/29/2016   CHOL 245 (H) 09/29/2016   TRIG 208.0 (H) 09/29/2016   HDL 52.20 09/29/2016   LDLDIRECT 73.0 09/29/2016   LDLCALC 129 (H) 10/24/2014   ALT 19 09/29/2016   AST 19 09/29/2016   NA 134 (L) 09/29/2016   K 4.3 09/29/2016   CL 98 09/29/2016   CREATININE 1.29 09/29/2016   BUN 17 09/29/2016   CO2 27 09/29/2016   TSH 1.72 01/30/2015   PSA 0.19 09/29/2016    Dg Shoulder Left  Result Date: 04/13/2016 CLINICAL DATA:  Chronic left shoulder pain, worsening. No known injury. EXAM: LEFT SHOULDER - 2+ VIEW COMPARISON:  None. FINDINGS: There is no evidence of fracture or dislocation. There is no evidence of arthropathy or other focal bone abnormality. Soft tissues are unremarkable. IMPRESSION: Negative. Electronically Signed   By: Charlett NoseKevin  Dover M.D.   On: 04/13/2016 16:46    Assessment & Plan:   Angel Combs was seen today for annual exam.  Diagnoses and all orders for this visit:  Routine health maintenance- Exam completed, labs ordered and reviewed, vaccines reviewed and updated, he wants to be screened for colon cancer so I ordered Cologuard, patient education material was given.  Elevated cholesterol with elevated triglycerides- his triglycerides are still elevated 208 so I've asked him to increase his fish oil dose, his Framingham risk score is only 5% so I do not recommend that he start taking a statin.   I have discontinued Mr. Adelene AmasJames's Indomethacin. I am also having him maintain his Fish Oil, pseudoephedrine, loratadine, fluticasone, citalopram, Diclofenac Sodium, and Vitamin D (Ergocalciferol).  No orders of the defined types were placed in this encounter.    Follow-up: Return if symptoms worsen or fail to improve.  Sanda Lingerhomas Javius Sylla, MD

## 2016-10-06 ENCOUNTER — Telehealth: Payer: Self-pay

## 2016-10-06 NOTE — Telephone Encounter (Signed)
Order [id number]

## 2016-10-20 ENCOUNTER — Ambulatory Visit: Payer: BLUE CROSS/BLUE SHIELD | Admitting: Internal Medicine

## 2016-10-26 NOTE — Progress Notes (Signed)
Tawana ScaleZach Cadie Sorci D.O. [facility] [address] [place], [place] [address] Phone: [phone] Subjective:     BJ:YNWGCC:Left shoulder pain, right heel pain f/u   NFA:OZHYQMVHQIHPI:Subjective  Angel Combs is a 50 y.o. male coming in with complaint of left shoulder pain. Patient sent have more of a bursitis of the left shoulder. Patient was to do home exercises and states that it seems to be doing relatively well. Has been very careful with lifting but continues to have pain. Unable to start increasing his weight lifting secondary to the pain.   Also complaining of right heel pain. Found to have more of a retrocalcaneal bursitis. An states that it does feel a little bit better overall. States that the pain is more in the heel. Has not been running secondary to the discomfort.   Previous x-rays taken 04/13/2016 of left shoulder were independently visualized by me showing no bony abnormality.     Past medical, surgical, social and family history all reviewed as of 10/26/16.  No pertanent information unless stated regarding to the chief complaint.  Past Medical History:  Diagnosis Date  . ALLERGIC RHINITIS CAUSE UNSPECIFIED   . Anxiety    using celxa.  [name]. LACTOSE INTOLERANCE   . Left shoulder pain July '13   had tried accupuncture.   . Pain of left heel   . PATELLO-FEMORAL SYNDROME    sees chiropractor with good results using exercises.   Past Surgical History:  Procedure Laterality Date  . lasik  Augu 22, 2013  . NOSE SURGERY  1991   Social History   Social History  . Marital status: Single    Spouse name: N/A  . Number of children: 0  . Years of education: 8124   Occupational History  . Real Chief Technology Officerestate developer    Social History Main Topics  . Smoking status: Never Smoker  . Smokeless tobacco: Never Used  . Alcohol use 7.5 oz/week    15 drink(s) per week  . Drug use: No  . Sexual activity: Yes    Partners: Female   Other Topics Concern  . Not on file   Social History Narrative     HSG, [employer]/ROTC. BS, MBA, MA-geography, MA foreign affairs  [employer], MS- MIS. 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 No regular exercise program.   No Known Allergies Family History  Problem Relation Age of Onset  . Hyperlipidemia Father   . Hypertension Father   . Coronary artery disease Father   . Diabetes Maternal Grandmother   . Coronary artery disease Other   . Arthritis Mother     back pain  . Cancer Neg Hx   . Depression Neg Hx   . Early death Neg Hx   . Hearing loss Neg Hx   . Heart disease Neg Hx   . Kidney disease Neg Hx   . Stroke Neg Hx      Review of systems reviewed as of 10/26/16 Review of Systems: No headache, visual changes, nausea, vomiting, diarrhea, constipation, dizziness, abdominal pain, skin rash, fevers, chills, night sweats, weight loss, swollen lymph nodes, body aches, joint swelling, muscle aches, chest pain, shortness of breath, mood changes.   Objective  There were no vitals taken for this visit. Systems examined below as of 10/26/16   General: No apparent distress alert and oriented x3 mood and affect normal, dressed appropriately.  HEENT: Pupils  equal, extraocular movements intact  Respiratory: Patient's speak in full sentences and does not appear short of breath  Cardiovascular: No lower extremity edema, non tender, no erythema  Skin: Warm dry intact with no signs of infection or rash on extremities or on axial skeleton.  Abdomen: Soft nontender  Neuro: Cranial nerves II through XII are intact, neurovascularly intact in all extremities with 2+ DTRs and 2+ pulses.  Lymph: No lymphadenopathy of posterior or anterior cervical chain or axillae bilaterally.  Gait normal with good balance and coordination.  MSK:  Non tender with full range of motion and good stability and symmetric strength and tone of  elbows, wrist, hip, knee and ankles bilaterally.   Right heel exam shows the patient is nontender over insertion of achiles, more pain on the medial heel.[name]. No erythema or swelling noted. Full range of motion of the ankle. Neurovascularly intact distally.  Shoulder: left Inspection reveals no abnormalities, atrophy or asymmetry. Palpation is normal with no tenderness over AC joint or bicipital groove. ROM is full in all planes passively. Rotator cuff strength normal throughout. signs of impingement with positive Neer and Hawkin's tests, but negative empty can sign. Speeds and Yergason's tests normal. No labral pathology noted with negative Obrien's, negative clunk and good stability. Normal scapular function observed. No painful arc and no drop arm sign. No apprehension sign  MSK US performed of: left This study was ordered, performed, and interpreted by Terrilee FilesZach Isiaah Cuervo D.O.  Shoulder:   Supraspinatus:  Appears normal on long and transverse views, Bursal bulge seen with shoulder abduction on impingement view. Infraspinatus:  Appears normal on long and transverse views. Significant increase in Doppler flow Subscapularis:  Appears normal on long and transverse views. Positive bursa Teres Minor:  Appears normal on long and transverse views. AC joint:  Capsule undistended, no geyser sign. Glenohumeral Joint:  Appears normal without effusion. Glenoid Labrum:  Intact without visualized tears. Biceps Tendon:  Appears normal on long and transverse views, no fraying of tendon, tendon located in intertubercular groove, no subluxation with shoulder internal or external rotation.  Impression: Subacromial bursitis  Procedure: Real-time Ultrasound Guided Injection of left glenohumeral joint Device: GE Logiq E  Ultrasound guided injection is preferred based studies that show increased duration, increased effect, greater accuracy, decreased procedural pain, increased response rate with ultrasound guided versus blind injection.  Verbal informed consent  obtained.  Time-out conducted.  Noted no overlying erythema, induration, or other signs of local infection.  Skin prepped in a sterile fashion.  Local anesthesia: Topical Ethyl chloride.  With sterile technique and under real time ultrasound guidance:  Joint visualized.  23g 1  inch needle inserted posterior approach. Pictures taken for needle placement. Patient did have injection of 2 cc of 1% lidocaine, 2 cc of 0.5% Marcaine, and 1.0 cc of Kenalog 40 mg/dL. Completed without difficulty  Pain immediately resolved suggesting accurate placement of the medication.  Advised to call if fevers/chills, erythema, induration, drainage, or persistent bleeding.  Images permanently stored and available for review in the ultrasound unit.  Impression: Technically successful ultrasound guided injection.      Impression and Recommendations:     This case required medical decision making of moderate complexity.      Note: This dictation was prepared with Dragon dictation along with smaller phrase technology. Any transcriptional errors that result from this process are unintentional.

## 2016-10-27 ENCOUNTER — Ambulatory Visit (INDEPENDENT_AMBULATORY_CARE_PROVIDER_SITE_OTHER): Payer: BLUE CROSS/BLUE SHIELD | Admitting: Family Medicine

## 2016-10-27 ENCOUNTER — Ambulatory Visit: Payer: Self-pay

## 2016-10-27 ENCOUNTER — Encounter: Payer: Self-pay | Admitting: Family Medicine

## 2016-10-27 VITALS — BP 122/78 | HR 57 | Ht 71.0 in | Wt 174.0 lb

## 2016-10-27 DIAGNOSIS — M7552 Bursitis of left shoulder: Secondary | ICD-10-CM

## 2016-10-27 DIAGNOSIS — M7751 Other enthesopathy of right foot: Secondary | ICD-10-CM | POA: Diagnosis not present

## 2016-10-27 DIAGNOSIS — M25512 Pain in left shoulder: Secondary | ICD-10-CM | POA: Diagnosis not present

## 2016-10-27 MED ORDER — NITROGLYCERIN 0.2 MG/HR TD PT24: MEDICATED_PATCH | TRANSDERMAL | 1 refills | Status: DC

## 2016-10-27 NOTE — Assessment & Plan Note (Signed)
Given injection today and tolerated the procedure well. We discussed icing regimen and home exercises. We discussed which activities to do a which was to avoid. Patient has topical anti-inflammatories. Continue the once weekly vitamin D. Patient come back and see me again in 4 weeks

## 2016-10-27 NOTE — Patient Instructions (Signed)
Good to see you  Ice is your friend still  Nitroglycerin Protocol   Apply 1/4 nitroglycerin patch to affected area daily.  Change position of patch within the affected area every 24 hours.  You may experience a headache during the first 1-2 weeks of using the patch, these should subside.  If you experience headaches after beginning nitroglycerin patch treatment, you may take your preferred over the counter pain reliever.  Another side effect of the nitroglycerin patch is skin irritation or rash related to patch adhesive.  Please notify our office if you develop more severe headaches or rash, and stop the patch.  Tendon healing with nitroglycerin patch may require 12 to 24 weeks depending on the extent of injury.  Men should not use if taking Viagra, Cialis, or Levitra.   Do not use if you have migraines or rosacea.   Start a walk-run progression: Only 2 times a week Once you have reached 30 mins: - Run 2 mins, then walk 1 min. -Then run 3 mins, and walk 1 min. -Then run 4 mins, and walk 1 min. -Then run 5 mins, and walk 1 min. -Slowly build up weekly to running 30 mins nonstop.  If painful at any of the steps, back up one step.  Injected shoulder shod help   See me again in 4 weeks.

## 2016-10-27 NOTE — Assessment & Plan Note (Signed)
Possible mild improvement slowly. Patient was having more pain over the medial calcaneal region. Encourage him to start increasing his running. We discussed which proper shoes could be good. We discussed continuing the healing. Follow-up again with me in 4 weeks. Worsening symptoms consider formal physical therapy or possible injection.

## 2016-11-24 NOTE — Progress Notes (Signed)
Tawana ScaleZach Smith D.O. [facility] [address] [place], [place] [address] Phone: [phone] Subjective:     BJ:YNWGCC:Left shoulder pain, right heel pain f/u   NFA:OZHYQMVHQIHPI:Subjective  Angel Combs is a 50 y.o. male coming in with complaint of left shoulder pain. Patient sent have more of a bursitis of the left shoulder. Patient was continuing have pain at last follow-up and was given injection 1 month ago. Patient states 100% better. Not having any pain at all. Very minimal discomfort at night. Not doing the icing of the home exercises on a regular basis. Has started increasing weight on a regular basis though recently.   Also complaining of right heel pain. Found to have more of a retrocalcaneal bursitis. Patient wasn't having more pain in the medial compartment of the ankle. Patient was started on nitroglycerin patches to aid in healing. Patient was to continue conservative therapy. Patient states didn't attempt to run. Had severe pain lasted for approximately 24 hours and then ran again and is not having any significant pain. Mild tightness but nothing severe. Has been able to start increasing activity as tolerated. Has started working on a more basis with less pain as well.  Previous x-rays taken 04/13/2016 of left shoulder were independently visualized by me showing no bony abnormality.     Past medical, surgical, social and family history all reviewed as of 11/25/16.  No pertanent information unless stated regarding to the chief complaint.  Past Medical History:  Diagnosis Date  . ALLERGIC RHINITIS CAUSE UNSPECIFIED   . Anxiety    using celxa.  [name]. LACTOSE INTOLERANCE   . Left shoulder pain July '13   had tried accupuncture.   . Pain of left heel   . PATELLO-FEMORAL SYNDROME    sees chiropractor with good results using exercises.   Past Surgical History:  Procedure Laterality Date  . lasik  Augu 22, 2013  . NOSE SURGERY  1991   Social History   Social History  . Marital status:  Single    Spouse name: N/A  . Number of children: 0  . Years of education: 5724   Occupational History  . Real Chief Technology Officerestate developer    Social History Main Topics  . Smoking status: Never Smoker  . Smokeless tobacco: Never Used  . Alcohol use 7.5 oz/week    15 drink(s) per week  . Drug use: No  . Sexual activity: Yes    Partners: Female   Other Topics Concern  . None   Social History Narrative   HSG, [employer]/ROTC. BS, MBA, MA-geography, MA foreign affairs  [employer], MS- MIS. 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 No regular exercise program.   No Known Allergies Family History  Problem Relation Age of Onset  . Hyperlipidemia Father   . Hypertension Father   . Coronary artery disease Father   . Diabetes Maternal Grandmother   . Coronary artery disease Other   . Arthritis Mother     back pain  . Cancer Neg Hx   . Depression Neg Hx   . Early death Neg Hx   . Hearing loss Neg Hx   . Heart disease Neg Hx   . Kidney disease Neg Hx   . Stroke Neg Hx      Review of systems reviewed as of 11/25/16 Review of Systems: No headache, visual changes, nausea, vomiting, diarrhea, constipation, dizziness, abdominal pain,  skin rash, fevers, chills, night sweats, weight loss, swollen lymph nodes, body aches, joint swelling, muscle aches, chest pain, shortness of breath, mood changes.   Objective  Blood pressure 124/82, pulse 68, height 5\' 11"  (1.803 m), weight 176 lb (79.8 kg), SpO2 97 %.  Systems examined below as of 11/25/16 General: NAD A&O x3 mood, affect normal  HEENT: Pupils equal, extraocular movements intact no nystagmus Respiratory: not short of breath at rest or with speaking Cardiovascular: No lower extremity edema, non tender Skin: Warm dry intact with no signs of infection or rash on extremities or on axial skeleton. Abdomen: Soft nontender, no masses Neuro: Cranial nerves   intact, neurovascularly intact in all extremities with 2+ DTRs and 2+ pulses. Lymph: No lymphadenopathy appreciated today  Gait normal with good balance and coordination.  MSK: Non tender with full range of motion and good stability and symmetric strength and tone of elbows, wrist,  knee hips and ankles bilaterally.   Right heel exam shows the patient is nontender over insertion of achiles, Non-tender of the medial calcaneal area where patient was tender previously. Mild overpronation of the hindfoot.  Shoulder: left Inspection reveals no abnormalities, atrophy or asymmetry. Palpation is normal with no tenderness over AC joint or bicipital groove. ROM is full in all planes passively. Rotator cuff strength normal throughout. No signs of impingement Speeds and Yergason's tests normal. No labral pathology noted with negative Obrien's, negative clunk and good stability. Normal scapular function observed. No painful arc and no drop arm sign. No apprehension sign Contralateral shoulder unremarkable.        Impression and Recommendations:     This case required medical decision making of moderate complexity.      Note: This dictation was prepared with Dragon dictation along with smaller phrase technology. Any transcriptional errors that result from this process are unintentional.

## 2016-11-25 ENCOUNTER — Encounter: Payer: Self-pay | Admitting: Family Medicine

## 2016-11-25 ENCOUNTER — Ambulatory Visit (INDEPENDENT_AMBULATORY_CARE_PROVIDER_SITE_OTHER): Payer: BLUE CROSS/BLUE SHIELD | Admitting: Family Medicine

## 2016-11-25 DIAGNOSIS — M7751 Other enthesopathy of right foot: Secondary | ICD-10-CM | POA: Diagnosis not present

## 2016-11-25 DIAGNOSIS — M7552 Bursitis of left shoulder: Secondary | ICD-10-CM | POA: Diagnosis not present

## 2016-11-25 NOTE — Assessment & Plan Note (Signed)
Improved after injection. Discussed proper lifting mechanics. Patient will start to increase activity as tolerated. Worsening symptoms to come back. Discussed the importance of icing.

## 2016-11-25 NOTE — Patient Instructions (Addendum)
Good to see you  Happy holidays!  Ice is always your friend.  Stay active.   Watch the lifting technique For the knee straighten leg and go up and down when sitting for a long amount of time.  Keep hands within peripheral vision  [employer] "total support" online would be great  See me when you need me.

## 2016-11-25 NOTE — Assessment & Plan Note (Signed)
Patient seems to be doing relatively well. Likely did have a possible spontaneous rupture. Patient is having significant less pain. Patient can start increasing activity. Return as needed.

## 2017-01-01 NOTE — Telephone Encounter (Signed)
Canceled due to inactivity 

## 2017-01-08 ENCOUNTER — Telehealth: Payer: Self-pay | Admitting: Emergency Medicine

## 2017-01-08 NOTE — Telephone Encounter (Signed)
Pt called and wants to know if you can call and go over vaccination with him. He wants to make sure everything is up to date. Thanks.

## 2017-01-08 NOTE — Telephone Encounter (Signed)
Spoke to pt and he is sending his immunization record from the [facility].

## 2017-01-13 ENCOUNTER — Encounter: Payer: Self-pay | Admitting: Internal Medicine

## 2017-01-25 ENCOUNTER — Encounter: Payer: Self-pay | Admitting: Internal Medicine

## 2017-01-25 ENCOUNTER — Other Ambulatory Visit (INDEPENDENT_AMBULATORY_CARE_PROVIDER_SITE_OTHER): Payer: BLUE CROSS/BLUE SHIELD

## 2017-01-25 ENCOUNTER — Ambulatory Visit (INDEPENDENT_AMBULATORY_CARE_PROVIDER_SITE_OTHER): Payer: BLUE CROSS/BLUE SHIELD | Admitting: Internal Medicine

## 2017-01-25 VITALS — BP 120/80 | HR 76 | Temp 98.1°F | Resp 16 | Wt 175.0 lb

## 2017-01-25 DIAGNOSIS — Z205 Contact with and (suspected) exposure to viral hepatitis: Secondary | ICD-10-CM | POA: Diagnosis not present

## 2017-01-25 NOTE — Patient Instructions (Signed)
Hepatitis A; Hepatitis B Vaccine injection What is this medicine? HEPATITIS A VACCINE; HEPATITIS B VACCINE (hep uh TAHY tis A vak SEEN; hep uh TAHY tis B vak SEEN) is a vaccine to protect from an infection with the hepatitis A and B virus. This vaccine does not contain the live viruses. It will not cause a hepatitis infection. This medicine may be used for other purposes; ask your health care provider or pharmacist if you have questions. COMMON BRAND NAME(S): Twinrix What should I tell my health care provider before I take this medicine? They need to know if you have any of these conditions: -bleeding disorder -fever or infection -heart disease -immune system problems -an unusual or allergic reaction to hepatitis A or B vaccine, neomycin, yeast, thimerosal, other medicines, foods, dyes, or preservatives -pregnant or trying to get pregnant -breast-feeding How should I use this medicine? This vaccine is for injection into a muscle. It is given by a health care professional. A copy of Vaccine Information Statements will be given before each vaccination. Read this sheet carefully each time. The sheet may change frequently. Talk to your pediatrician regarding the use of this medicine in children. Special care may be needed. Overdosage: If you think you have taken too much of this medicine contact a poison control center or emergency room at once. NOTE: This medicine is only for you. Do not share this medicine with others. What if I miss a dose? It is important not to miss your dose. Call your doctor or health care professional if you are unable to keep an appointment. What may interact with this medicine? -medicines that suppress your immune function like adalimumab, anakinra, infliximab -medicines to treat cancer -steroid medicines like prednisone or cortisone This list may not describe all possible interactions. Give your health care provider a list of all the medicines, herbs,  non-prescription drugs, or dietary supplements you use. Also tell them if you smoke, drink alcohol, or use illegal drugs. Some items may interact with your medicine. What should I watch for while using this medicine? See your health care provider for all shots of this vaccine as directed. You must have 3 to 4 shots of this vaccine for protection from hepatitis A and B infection. Tell your doctor right away if you have any serious or unusual side effects after getting this vaccine. What side effects may I notice from receiving this medicine? Side effects that you should report to your doctor or health care professional as soon as possible: -allergic reactions like skin rash, itching or hives, swelling of the face, lips, or tongue -breathing problems -confused, irritated -fast, irregular heartbeat -flu-like syndrome -numb, tingling pain -seizures Side effects that usually do not require medical attention (report to your doctor or health care professional if they continue or are bothersome): -diarrhea -fever -headache -loss of appetite -muscle pain -nausea -pain, redness, swelling, or irritation at site where injected -tiredness This list may not describe all possible side effects. Call your doctor for medical advice about side effects. You may report side effects to FDA at 1-800-FDA-1088. Where should I keep my medicine? This drug is given in a hospital or clinic and will not be stored at home. NOTE: This sheet is a summary. It may not cover all possible information. If you have questions about this medicine, talk to your doctor, pharmacist, or health care provider.  2017 Elsevier/Gold Standard (2008-04-27 15:21:37)

## 2017-01-25 NOTE — Progress Notes (Signed)
Pre visit review using our clinic review tool, if applicable. No additional management support is needed unless otherwise documented below in the visit note. 

## 2017-01-26 ENCOUNTER — Encounter: Payer: Self-pay | Admitting: Internal Medicine

## 2017-01-26 LAB — HEPATITIS B SURFACE ANTIGEN: HEP B S AG: NEGATIVE

## 2017-01-26 LAB — HEPATITIS B SURFACE ANTIBODY,QUALITATIVE: HEP B S AB: POSITIVE — AB

## 2017-01-26 LAB — HEPATIC FUNCTION PANEL
ALT: 22 U/L (ref 0–53)
AST: 22 U/L (ref 0–37)
Albumin: 4.6 g/dL (ref 3.5–5.2)
Alkaline Phosphatase: 61 U/L (ref 39–117)
BILIRUBIN TOTAL: 0.2 mg/dL (ref 0.2–1.2)
Bilirubin, Direct: 0.1 mg/dL (ref 0.0–0.3)
Total Protein: 7.8 g/dL (ref 6.0–8.3)

## 2017-01-26 LAB — HEPATITIS A ANTIBODY, TOTAL: Hep A Total Ab: REACTIVE — AB

## 2017-01-26 LAB — HEPATITIS B CORE ANTIBODY, TOTAL: Hep B Core Total Ab: NONREACTIVE

## 2017-01-26 LAB — HEPATITIS C ANTIBODY: HCV AB: NEGATIVE

## 2017-01-27 NOTE — Progress Notes (Signed)
Subjective:  Patient ID: Angel Combs, male    DOB: 03/17/1966  Age: 51 y.o. MRN: [id number]  CC: Hepatitis   HPI Angel Combs presents for concerns about exposure to hepatitis B. He has been dating an [place] male who tells him that she contracted hepatitis B as a child. He doesn't know if this is an active infection for her or if it has resolved. He has been dating her and the only contact has been kissing but he is concerned that he may have contracted hepatitis be and wants to know his immunity status on the viral hepatitides.  Outpatient Medications Prior to Visit  Medication Sig Dispense Refill  . citalopram (CELEXA) 10 MG tablet Take 1 tablet (10 mg total) by mouth daily. 90 tablet 1  . Diclofenac Sodium (PENNSAID) 2 % SOLN Place 2 application onto the skin 2 (two) times daily. 112 g 3  . fluticasone (FLONASE) 50 MCG/ACT nasal spray Place 2 sprays into both nostrils daily. 16 g 2  . Omega-3 Fatty Acids (FISH OIL) 1000 MG CAPS Take by mouth daily.      . pseudoephedrine (SUDAFED) 30 MG tablet Take 30 mg by mouth every 4 (four) hours as needed.      . Vitamin D, Ergocalciferol, (DRISDOL) 50000 units CAPS capsule Take 1 capsule (50,000 Units total) by mouth every 7 (seven) days. 12 capsule 0   No facility-administered medications prior to visit.     ROS Review of Systems  Constitutional: Negative for appetite change, chills, fatigue and fever.  HENT: Negative.   Eyes: Negative.   Respiratory: Negative.  Negative for cough, chest tightness, wheezing and stridor.   Cardiovascular: Negative for chest pain, palpitations and leg swelling.  Gastrointestinal: Negative for abdominal pain, constipation, diarrhea, nausea and vomiting.  Endocrine: Negative.   Genitourinary: Negative.   Musculoskeletal: Negative.   Skin: Negative.  Negative for color change.  Allergic/Immunologic: Negative.   Neurological: Negative.   Hematological: Negative.   Psychiatric/Behavioral: Negative.      Objective:  BP 120/80   Pulse 76   Temp 98.1 F (36.7 C) (Oral)   Resp 16   Wt 175 lb (79.4 kg)   SpO2 98%   BMI 24.41 kg/m   BP Readings from Last 3 Encounters:  01/27/17 120/80  11/25/16 124/82  10/27/16 122/78    Wt Readings from Last 3 Encounters:  01/27/17 175 lb (79.4 kg)  11/25/16 176 lb (79.8 kg)  10/27/16 174 lb (78.9 kg)    Physical Exam  Constitutional: He is oriented to person, place, and time. No distress.  HENT:  Mouth/Throat: Oropharynx is clear and moist. No oropharyngeal exudate.  Eyes: Conjunctivae are normal. Right eye exhibits no discharge. Left eye exhibits no discharge. No scleral icterus.  Neck: Normal range of motion. Neck supple. No JVD present. No tracheal deviation present. No thyromegaly present.  Cardiovascular: Normal rate, regular rhythm, normal heart sounds and intact distal pulses.  Exam reveals no gallop and no friction rub.   No murmur heard. Pulmonary/Chest: Effort normal and breath sounds normal. No stridor. No respiratory distress. He has no wheezes. He has no rales. He exhibits no tenderness.  Abdominal: Soft. Bowel sounds are normal. He exhibits no distension and no mass. There is no tenderness. There is no rebound and no guarding.  Musculoskeletal: Normal range of motion. He exhibits no edema, tenderness or deformity.  Neurological: He is oriented to person, place, and time.  Skin: Skin is warm and dry. No rash noted. He  is not diaphoretic. No erythema. No pallor.  Vitals reviewed.   Lab Results  Component Value Date   WBC 4.5 09/29/2016   HGB 14.3 09/29/2016   HCT 40.6 09/29/2016   PLT 226.0 09/29/2016   GLUCOSE 89 09/29/2016   CHOL 245 (H) 09/29/2016   TRIG 208.0 (H) 09/29/2016   HDL 52.20 09/29/2016   LDLDIRECT 73.0 09/29/2016   LDLCALC 129 (H) 10/24/2014   ALT 22 01/25/2017   AST 22 01/25/2017   NA 134 (L) 09/29/2016   K 4.3 09/29/2016   CL 98 09/29/2016   CREATININE 1.29 09/29/2016   BUN 17 09/29/2016   CO2  27 09/29/2016   TSH 1.72 01/30/2015   PSA 0.19 09/29/2016    Dg Shoulder Left  Result Date: 04/13/2016 CLINICAL DATA:  Chronic left shoulder pain, worsening. No known injury. EXAM: LEFT SHOULDER - 2+ VIEW COMPARISON:  None. FINDINGS: There is no evidence of fracture or dislocation. There is no evidence of arthropathy or other focal bone abnormality. Soft tissues are unremarkable. IMPRESSION: Negative. Electronically Signed   By: Charlett Nose M.D.   On: 04/13/2016 16:46    Assessment & Plan:   Tan was seen today for hepatitis.  Diagnoses and all orders for this visit:  Exposure to hepatitis- he has no signs or symptoms suspicious for hepatitis infection. He has positive immunity to hepatitis A and B so there is no further action needed. He is negative for hepatitis C infection. I reassured him that I don't think he is at risk of contracting hepatitis B. -     Hepatitis A antibody, total; Future -     Hepatitis B core antibody, total; Future -     Hepatitis B surface antibody; Future -     Hepatitis C antibody; Future -     Hepatitis B surface antigen; Future -     Hepatic function panel; Future   I am having Angel Combs maintain his Fish Oil, pseudoephedrine, fluticasone, citalopram, Diclofenac Sodium, Vitamin D (Ergocalciferol), and nitroGLYCERIN.  Meds ordered this encounter  Medications  . nitroGLYCERIN (NITRODUR - DOSED IN MG/24 HR) 0.2 mg/hr patch    Sig: apply 1/4 patch daily    Refill:  0     Follow-up: Return if symptoms worsen or fail to improve.  Sanda Linger, MD

## 2017-03-08 ENCOUNTER — Encounter: Payer: Self-pay | Admitting: Internal Medicine

## 2017-03-08 ENCOUNTER — Other Ambulatory Visit: Payer: Self-pay | Admitting: Internal Medicine

## 2017-03-08 DIAGNOSIS — F32A Depression, unspecified: Secondary | ICD-10-CM

## 2017-03-08 DIAGNOSIS — F329 Major depressive disorder, single episode, unspecified: Secondary | ICD-10-CM

## 2017-03-08 MED ORDER — CITALOPRAM HYDROBROMIDE 10 MG PO TABS: 10.0000 mg | ORAL_TABLET | Freq: Every day | ORAL | 3 refills | Status: DC

## 2017-10-19 ENCOUNTER — Ambulatory Visit (INDEPENDENT_AMBULATORY_CARE_PROVIDER_SITE_OTHER): Payer: TRICARE For Life (TFL) | Admitting: General Practice

## 2017-10-19 DIAGNOSIS — Z23 Encounter for immunization: Secondary | ICD-10-CM

## 2018-04-18 NOTE — Progress Notes (Signed)
Angel Combs D.O. [facility] [address] [place], [place] [address] Phone: [phone] Subjective:    I'm seeing this patient by the request  of:    CC: Left shoulder pain  BJY:[id number]:Subjective  Angel Combs is a 52 y.o. male coming in with complaint of L shoulder pain.  He was most recently seen for the same shoulder in Nov 2017.  He states that he feels like he re-injured himself on Thursday when he was doing bench.  He states that he felt a pop and pain in his L ant shoulder w/ radiating pain into his L bicep.  He states that his L shoulder is feeling better at this point and is able to donn/doff clothing w/ less pain.  He notes that he is now able to raise his L arm overhead.  He also notes some similar type symptoms in his R arm that have been occurring off and on over the past few months/year.  Onset- Thursday, April 14, 2018 while doing bench press Location- L ant shoulder and upper arm Character- sharp pain Aggravating factors- Overhead ROM Severity of the pain is 7 out of 10.  Patient has just avoided heavy lifting at this moment.     Past Medical History:  Diagnosis Date  . ALLERGIC RHINITIS CAUSE UNSPECIFIED   . Anxiety    using celxa.  [name]. LACTOSE INTOLERANCE   . Left shoulder pain July '13   had tried accupuncture.   . Pain of left heel   . PATELLO-FEMORAL SYNDROME    sees chiropractor with good results using exercises.   Past Surgical History:  Procedure Laterality Date  . lasik  Augu 22, 2013  . NOSE SURGERY  1991   Social History   Socioeconomic History  . Marital status: Single    Spouse name: Not on file  . Number of children: 0  . Years of education: 10924  . Highest education level: Not on file  Occupational History  . Occupation: Real Chief Technology Officerestate developer  Social Needs  . Financial resource strain: Not on file  . Food insecurity:    Worry: Not on file    Inability: Not on file  . Transportation needs:    Medical: Not on file    Non-medical:  Not on file  Tobacco Use  . Smoking status: Never Smoker  . Smokeless tobacco: Never Used  Substance and Sexual Activity  . Alcohol use: Yes    Alcohol/week: 7.5 oz    Types: 15 drink(s) per week  . Drug use: No  . Sexual activity: Yes    Partners: Female  Lifestyle  . Physical activity:    Days per week: Not on file    Minutes per session: Not on file  . Stress: Not on file  Relationships  . Social connections:    Talks on phone: Not on file    Gets together: Not on file    Attends religious service: Not on file    Active member of club or organization: Not on file    Attends meetings of clubs or organizations: Not on file    Relationship status: Not on file  Other Topics Concern  . Not on file  Social History Narrative   HSG, [employer]/ROTC. BS, MBA, MA-geography, MA foreign affairs  [place], MS- MIS. 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 No regular exercise program.   No  Known Allergies Family History  Problem Relation Age of Onset  . Hyperlipidemia Father   . Hypertension Father   . Coronary artery disease Father   . Diabetes Maternal Grandmother   . Coronary artery disease Other   . Arthritis Mother        back pain  . Cancer Neg Hx   . Depression Neg Hx   . Early death Neg Hx   . Hearing loss Neg Hx   . Heart disease Neg Hx   . Kidney disease Neg Hx   . Stroke Neg Hx      Past medical history, social, surgical and family history all reviewed in electronic medical record.  No pertanent information unless stated regarding to the chief complaint.   Review of Systems:Review of systems updated and as accurate as of 04/19/18  No headache, visual changes, nausea, vomiting, diarrhea, constipation, dizziness, abdominal pain, skin rash, fevers, chills, night sweats, weight loss, swollen lymph nodes, body aches, joint swelling,  chest pain, shortness of breath, mood  changes.  Positive muscle aches  Objective  Blood pressure 120/78, pulse 70, height 5\' 11"  (1.803 m), weight 174 lb 9.6 oz (79.2 kg), SpO2 99 %. Systems examined below as of 04/19/18   General: No apparent distress alert and oriented x3 mood and affect normal, dressed appropriately.  HEENT: Pupils equal, extraocular movements intact  Respiratory: Patient's speak in full sentences and does not appear short of breath  Cardiovascular: No lower extremity edema, non tender, no erythema  Skin: Warm dry intact with no signs of infection or rash on extremities or on axial skeleton.  Abdomen: Soft nontender  Neuro: Cranial nerves II through XII are intact, neurovascularly intact in all extremities with 2+ DTRs and 2+ pulses.  Lymph: No lymphadenopathy of posterior or anterior cervical chain or axillae bilaterally.  Gait normal with good balance and coordination.  MSK:  Non tender with full range of motion and good stability and symmetric strength and tone of  elbows, wrist, hip, knee and ankles bilaterally.  Shoulder: Left Inspection reveals no abnormalities, atrophy or asymmetry. Palpation is normal with no tenderness over AC joint or bicipital groove. ROM is full in all planes. Rotator cuff strength 4 out of 5 compared to the contralateral side Positive impingement Speeds and Yergason's tests normal. Mild positive O'Brien's and crossover Normal scapular function observed. No painful arc and no drop arm sign. No apprehension sign Contralateral shoulder unremarkable  MSK US performed of: left shoulder pain  This study was ordered, performed, and interpreted by Terrilee Files D.O.   Shoulder:   Supraspinatus: Patient does have what appears to be a superficial tear with mild retraction of the supraspinatus.  Retraction approximately 1 cm.  Does not seem to be acute. Infraspinatus:  Appears normal on long and transverse views. Subscapularis:  Appears normal on long and transverse views.  Mild  hypoechoic changes Teres Minor:  Appears normal on long and transverse views. AC joint: Mild arthritic changes Glenohumeral Joint:  Appears normal without effusion. Glenoid Labrum:  Intact without visualized tears.  Noted Biceps Tendon:  Appears normal on long and transverse views, no fraying of tendon, tendon located in intertubercular groove, no subluxation with shoulder internal or external rotation. No increased power doppler signal. Impression: Partial-thickness tear of the supraspinatus  [id number]; 15 additional minutes spent for Therapeutic exercises as stated in above notes.  This included exercises focusing on stretching, strengthening, with significant focus on eccentric aspects.   Long term goals include an improvement  in range of motion, strength, endurance as well as avoiding reinjury. Patient's frequency would include in 1-2 times a day, 3-5 times a week for a duration of 6-12 weeks. Shoulder Exercises that included:  Basic scapular stabilization to include adduction and depression of scapula Scaption, focusing on proper movement and good control Internal and External rotation utilizing a theraband, with elbow tucked at side entire time Rows with theraband  Which was given today   Proper technique shown and discussed handout in great detail with ATC.  All questions were discussed and answered.      Impression and Recommendations:     This case required medical decision making of moderate complexity.      Note: This dictation was prepared with Dragon dictation along with smaller phrase technology. Any transcriptional errors that result from this process are unintentional.

## 2018-04-19 ENCOUNTER — Ambulatory Visit: Payer: Self-pay

## 2018-04-19 ENCOUNTER — Ambulatory Visit (INDEPENDENT_AMBULATORY_CARE_PROVIDER_SITE_OTHER): Admitting: Family Medicine

## 2018-04-19 ENCOUNTER — Encounter: Payer: Self-pay | Admitting: Family Medicine

## 2018-04-19 VITALS — BP 120/78 | HR 70 | Ht 71.0 in | Wt 174.6 lb

## 2018-04-19 DIAGNOSIS — M25512 Pain in left shoulder: Secondary | ICD-10-CM

## 2018-04-19 DIAGNOSIS — S46012A Strain of muscle(s) and tendon(s) of the rotator cuff of left shoulder, initial encounter: Secondary | ICD-10-CM | POA: Diagnosis not present

## 2018-04-19 DIAGNOSIS — M75102 Unspecified rotator cuff tear or rupture of left shoulder, not specified as traumatic: Secondary | ICD-10-CM | POA: Insufficient documentation

## 2018-04-19 MED ORDER — DICLOFENAC SODIUM 2 % TD SOLN: 1.0000 "application " | Freq: Two times a day (BID) | TRANSDERMAL | 0 refills | Status: AC

## 2018-04-19 MED ORDER — DICLOFENAC SODIUM 2 % TD SOLN: 1.0000 "application " | Freq: Two times a day (BID) | TRANSDERMAL | 2 refills | Status: DC

## 2018-04-19 MED ORDER — NITROGLYCERIN 0.2 MG/HR TD PT24
MEDICATED_PATCH | TRANSDERMAL | 0 refills | Status: DC
Start: 2018-04-19 — End: 2018-05-16

## 2018-04-19 NOTE — Patient Instructions (Signed)
Good to see you  Ice 20 minutes 2 times daily. Usually after activity and before bed. pennsaid pinkie amount topically 2 times daily as needed.  Nitroglycerin Protocol   Apply 1/4 nitroglycerin patch to affected area daily.  Change position of patch within the affected area every 24 hours.  You may experience a headache during the first 1-2 weeks of using the patch, these should subside.  If you experience headaches after beginning nitroglycerin patch treatment, you may take your preferred over the counter pain reliever.  Another side effect of the nitroglycerin patch is skin irritation or rash related to patch adhesive.  Please notify our office if you develop more severe headaches or rash, and stop the patch.  Tendon healing with nitroglycerin patch may require 12 to 24 weeks depending on the extent of injury.  Men should not use if taking Viagra, Cialis, or Levitra.   Do not use if you have migraines or rosacea.  Exercises 3 times a week.  Keep hands within peripheral vision  DHEA 50 mg daily for 4 weeks then lay off of it for a week  Vitamin D 2000 IU daily  Drop weight at least 50%  See me again in 4-6 weeks

## 2018-04-19 NOTE — Assessment & Plan Note (Signed)
Patient was lifting weights at the time of the injury.  Partial-thickness tear noted.  Some mild retraction.  Patient asked if any headaches or any use of Viagra which patient denied.  Started on nitroglycerin.  Warned of potential side effects.  Hopefully this will aid in healing.  Home exercises given and icing regimen.  Discussed topical anti-inflammatories.  Discussed avoiding any heavy lifting for the next 4 weeks.  Follow-up again in 4 weeks

## 2018-05-16 ENCOUNTER — Ambulatory Visit (INDEPENDENT_AMBULATORY_CARE_PROVIDER_SITE_OTHER): Admitting: Internal Medicine

## 2018-05-16 ENCOUNTER — Encounter: Payer: Self-pay | Admitting: Internal Medicine

## 2018-05-16 ENCOUNTER — Other Ambulatory Visit (INDEPENDENT_AMBULATORY_CARE_PROVIDER_SITE_OTHER)

## 2018-05-16 VITALS — BP 140/70 | HR 65 | Temp 98.5°F | Resp 16 | Ht 71.0 in | Wt 176.0 lb

## 2018-05-16 DIAGNOSIS — Z1212 Encounter for screening for malignant neoplasm of rectum: Secondary | ICD-10-CM

## 2018-05-16 DIAGNOSIS — Z23 Encounter for immunization: Secondary | ICD-10-CM | POA: Diagnosis not present

## 2018-05-16 DIAGNOSIS — E781 Pure hyperglyceridemia: Secondary | ICD-10-CM | POA: Insufficient documentation

## 2018-05-16 DIAGNOSIS — E782 Mixed hyperlipidemia: Secondary | ICD-10-CM

## 2018-05-16 DIAGNOSIS — Z0000 Encounter for general adult medical examination without abnormal findings: Secondary | ICD-10-CM | POA: Diagnosis not present

## 2018-05-16 DIAGNOSIS — I10 Essential (primary) hypertension: Secondary | ICD-10-CM | POA: Insufficient documentation

## 2018-05-16 DIAGNOSIS — Z1211 Encounter for screening for malignant neoplasm of colon: Secondary | ICD-10-CM

## 2018-05-16 LAB — CBC WITH DIFFERENTIAL/PLATELET
Basophils Absolute: 0 10*3/uL (ref 0.0–0.1)
Basophils Relative: 0.7 % (ref 0.0–3.0)
EOS PCT: 2 % (ref 0.0–5.0)
Eosinophils Absolute: 0.1 10*3/uL (ref 0.0–0.7)
HCT: 40.2 % (ref 39.0–52.0)
HEMOGLOBIN: 14 g/dL (ref 13.0–17.0)
Lymphocytes Relative: 35.6 % (ref 12.0–46.0)
Lymphs Abs: 2.1 10*3/uL (ref 0.7–4.0)
MCHC: 34.8 g/dL (ref 30.0–36.0)
MCV: 91.6 fl (ref 78.0–100.0)
MONOS PCT: 7.3 % (ref 3.0–12.0)
Monocytes Absolute: 0.4 10*3/uL (ref 0.1–1.0)
Neutro Abs: 3.2 10*3/uL (ref 1.4–7.7)
Neutrophils Relative %: 54.4 % (ref 43.0–77.0)
Platelets: 226 10*3/uL (ref 150.0–400.0)
RBC: 4.39 Mil/uL (ref 4.22–5.81)
RDW: 12.7 % (ref 11.5–15.5)
WBC: 5.9 10*3/uL (ref 4.0–10.5)

## 2018-05-16 LAB — COMPREHENSIVE METABOLIC PANEL
ALBUMIN: 4.4 g/dL (ref 3.5–5.2)
ALK PHOS: 56 U/L (ref 39–117)
ALT: 18 U/L (ref 0–53)
AST: 17 U/L (ref 0–37)
BUN: 19 mg/dL (ref 6–23)
CO2: 28 mEq/L (ref 19–32)
Calcium: 9.7 mg/dL (ref 8.4–10.5)
Chloride: 97 mEq/L (ref 96–112)
Creatinine, Ser: 1.24 mg/dL (ref 0.40–1.50)
GFR: 65.54 mL/min (ref >60.00)
Glucose, Bld: 88 mg/dL (ref 70–99)
POTASSIUM: 4 meq/L (ref 3.5–5.1)
Sodium: 134 mEq/L — ABNORMAL LOW (ref 135–145)
TOTAL PROTEIN: 7.6 g/dL (ref 6.0–8.3)
Total Bilirubin: 0.4 mg/dL (ref 0.2–1.2)

## 2018-05-16 LAB — URINALYSIS, ROUTINE W REFLEX MICROSCOPIC
Bilirubin Urine: NEGATIVE
Hgb urine dipstick: NEGATIVE
KETONES UR: NEGATIVE
Leukocytes, UA: NEGATIVE
Nitrite: NEGATIVE
PH: 6.5 (ref 5.0–8.0)
RBC / HPF: NONE SEEN (ref 0–?)
Total Protein, Urine: NEGATIVE
URINE GLUCOSE: NEGATIVE
UROBILINOGEN UA: 0.2 (ref 0.0–1.0)
WBC UA: NONE SEEN (ref 0–?)

## 2018-05-16 LAB — LIPID PANEL
Cholesterol: 334 mg/dL — ABNORMAL HIGH (ref 0–200)
HDL: 44.4 mg/dL (ref >39.00)
Total CHOL/HDL Ratio: 8

## 2018-05-16 LAB — PSA: PSA: 0.25 ng/mL (ref 0.10–4.00)

## 2018-05-16 LAB — LDL CHOLESTEROL, DIRECT: Direct LDL: 52 mg/dL

## 2018-05-16 MED ORDER — ICOSAPENT ETHYL 1 G PO CAPS: 2.0000 | ORAL_CAPSULE | Freq: Two times a day (BID) | ORAL | 1 refills | Status: DC

## 2018-05-16 NOTE — Progress Notes (Signed)
Subjective:  Patient ID: Angel Combs, male    DOB: Mar 25, 1966  Age: 52 y.o. MRN: [id number]  CC: Hyperlipidemia; Annual Exam; and Hypertension   HPI Angel Combs presents for a CPX.  He is concerned about his lipids and wants to have them rechecked.  He is doing well with lifestyle modifications and is taking an over-the-counter fish oil supplement to lower his triglycerides.  He is not fasting today but is also not had much to eat today.  He had a light lunch about 3 hours prior to this visit.  He does not drink alcohol.  He denies abdominal pain, nausea, vomiting, or loss of appetite.   Outpatient Medications Prior to Visit  Medication Sig Dispense Refill  . citalopram (CELEXA) 10 MG tablet Take 1 tablet (10 mg total) by mouth daily. (Patient taking differently: Take 10 mg by mouth daily. Taking every other day) 90 tablet 3  . Diclofenac Sodium (PENNSAID) 2 % SOLN Place 2 application onto the skin 2 (two) times daily. 112 g 3  . fluticasone (FLONASE) 50 MCG/ACT nasal spray Place 2 sprays into both nostrils daily. 16 g 2  . Omega-3 Fatty Acids (FISH OIL) 1000 MG CAPS Take by mouth daily.      . Diclofenac Sodium (PENNSAID) 2 % SOLN Place 1 application onto the skin 2 (two) times daily. 112 g 2  . nitroGLYCERIN (NITRODUR - DOSED IN MG/24 HR) 0.2 mg/hr patch apply 1/4 patch daily 30 patch 0  . pseudoephedrine (SUDAFED) 30 MG tablet Take 30 mg by mouth every 4 (four) hours as needed.      . Vitamin D, Ergocalciferol, (DRISDOL) 50000 units CAPS capsule Take 1 capsule (50,000 Units total) by mouth every 7 (seven) days. (Patient not taking: Reported on 04/19/2018) 12 capsule 0   No facility-administered medications prior to visit.     ROS Review of Systems  Constitutional: Negative.  Negative for appetite change, diaphoresis, fatigue and unexpected weight change.  HENT: Negative.  Negative for sore throat.   Eyes: Negative for visual disturbance.  Respiratory: Negative for cough, chest  tightness, shortness of breath and wheezing.   Cardiovascular: Negative for chest pain, palpitations and leg swelling.  Gastrointestinal: Negative for abdominal pain, constipation, diarrhea, nausea and vomiting.  Endocrine: Negative.   Genitourinary: Negative.  Negative for difficulty urinating, dysuria, hematuria, penile swelling, scrotal swelling, testicular pain and urgency.  Musculoskeletal: Negative.  Negative for arthralgias and myalgias.  Skin: Negative.   Neurological: Negative.  Negative for dizziness, weakness and light-headedness.  Hematological: Negative for adenopathy. Does not bruise/bleed easily.  Psychiatric/Behavioral: Negative.     Objective:  BP 140/70 (BP Location: Left Arm, Patient Position: Sitting, Cuff Size: Normal)   Pulse 65   Temp 98.5 F (36.9 C) (Oral)   Resp 16   Ht 5' 11"  (1.803 m)   Wt 176 lb (79.8 kg)   SpO2 97%   BMI 24.55 kg/m   BP Readings from Last 3 Encounters:  05/16/18 140/70  04/19/18 120/78  01/27/17 120/80    Wt Readings from Last 3 Encounters:  05/16/18 176 lb (79.8 kg)  04/19/18 174 lb 9.6 oz (79.2 kg)  01/27/17 175 lb (79.4 kg)    Physical Exam  Constitutional: He is oriented to person, place, and time. No distress.  HENT:  Mouth/Throat: Oropharynx is clear and moist. No oropharyngeal exudate.  Eyes: Conjunctivae are normal. No scleral icterus.  Neck: Normal range of motion. Neck supple. No JVD present. No thyromegaly present.  Cardiovascular:  Normal rate and regular rhythm. Exam reveals no gallop.  No murmur heard. Pulmonary/Chest: Effort normal and breath sounds normal. No respiratory distress. He has no wheezes. He has no rales.  Abdominal: Soft. Normal appearance and bowel sounds are normal. He exhibits no mass. There is no hepatosplenomegaly. There is no tenderness. Hernia confirmed negative in the right inguinal area and confirmed negative in the left inguinal area.  Genitourinary: Rectum normal, prostate normal, testes  normal and penis normal. Rectal exam shows no external hemorrhoid, no internal hemorrhoid, no fissure, no mass, no tenderness, anal tone normal and guaiac negative stool. Prostate is not enlarged and not tender. Right testis shows no mass, no swelling and no tenderness. Left testis shows no mass, no swelling and no tenderness. Circumcised. No penile erythema or penile tenderness. No discharge found.  Musculoskeletal: Normal range of motion. He exhibits no edema, tenderness or deformity.  Lymphadenopathy:    He has no cervical adenopathy. No inguinal adenopathy noted on the right or left side.  Neurological: He is alert and oriented to person, place, and time.  Skin: Skin is warm and dry. He is not diaphoretic.    Lab Results  Component Value Date   WBC 5.9 05/16/2018   HGB 14.0 05/16/2018   HCT 40.2 05/16/2018   PLT 226.0 05/16/2018   GLUCOSE 88 05/16/2018   CHOL 334 (H) 05/16/2018   TRIG (H) 05/16/2018    711.0 Triglyceride is over 400; calculations on Lipids are invalid.   HDL 44.40 05/16/2018   LDLDIRECT 52.0 05/16/2018   LDLCALC 129 (H) 10/24/2014   ALT 18 05/16/2018   AST 17 05/16/2018   NA 134 (L) 05/16/2018   K 4.0 05/16/2018   CL 97 05/16/2018   CREATININE 1.24 05/16/2018   BUN 19 05/16/2018   CO2 28 05/16/2018   TSH 2.27 05/16/2018   PSA 0.25 05/16/2018    Dg Shoulder Left  Result Date: 04/13/2016 CLINICAL DATA:  Chronic left shoulder pain, worsening. No known injury. EXAM: LEFT SHOULDER - 2+ VIEW COMPARISON:  None. FINDINGS: There is no evidence of fracture or dislocation. There is no evidence of arthropathy or other focal bone abnormality. Soft tissues are unremarkable. IMPRESSION: Negative. Electronically Signed   By: Rolm Baptise M.D.   On: 04/13/2016 16:46    Assessment & Plan:   Angel Combs was seen today for hyperlipidemia, annual exam and hypertension.  Diagnoses and all orders for this visit:  Routine health maintenance- Exam completed, labs reviewed, vaccines  reviewed and updated, to screen for colon cancer/polyps a Cologuard test was ordered, patient education material was given. -     Lipid panel; Future -     PSA; Future  Colon cancer screening -     Cologuard  Screening for malignant neoplasm of the rectum -     Cologuard  Essential hypertension- His blood pressure is mildly elevated.  He is asymptomatic with this.  His lab work is negative for secondary causes or endorgan damage.  I have asked him to stop taking decongestants.  At this time he does not want to start an antihypertensive but he will return in about 2 to 3 months for a recheck of his blood pressure.  If it remains above 140/90 then I will discuss with him starting an antihypertensive. -     Comprehensive metabolic panel; Future -     CBC with Differential/Platelet; Future -     Thyroid Panel With TSH; Future -     Urinalysis, Routine w  reflex microscopic; Future  Need for MMR vaccine -     Cancel: MMR and varicella combined vaccine subcutaneous -     MMR vaccine subcutaneous  Pure hyperglyceridemia- His triglycerides are up to 711.  I have encouraged him to continue the lifestyle modifications but also want him to upgrade to a prescription strength fish oil to reduce the risk of complications such as pancreatitis. -     Icosapent Ethyl (VASCEPA) 1 g CAPS; Take 2 capsules (2 g total) by mouth 2 (two) times daily.  Elevated cholesterol with elevated triglycerides- His total cholesterol is above 300 and he has a mildly elevated ASCVD risk score.  I have asked him to start taking a statin for CV risk reduction. -     rosuvastatin (CRESTOR) 10 MG tablet; Take 1 tablet (10 mg total) by mouth daily.   I have discontinued EchoStar, pseudoephedrine, Vitamin D (Ergocalciferol), and nitroGLYCERIN. I am also having him start on Icosapent Ethyl and rosuvastatin. Additionally, I am having him maintain his fluticasone, Diclofenac Sodium, and citalopram.  Meds ordered this  encounter  Medications  . Icosapent Ethyl (VASCEPA) 1 g CAPS    Sig: Take 2 capsules (2 g total) by mouth 2 (two) times daily.    Dispense:  360 capsule    Refill:  1  . rosuvastatin (CRESTOR) 10 MG tablet    Sig: Take 1 tablet (10 mg total) by mouth daily.    Dispense:  90 tablet    Refill:  1     Follow-up: Return in about 6 months (around 11/16/2018).  Scarlette Calico, MD

## 2018-05-16 NOTE — Patient Instructions (Signed)

## 2018-05-17 ENCOUNTER — Encounter: Payer: Self-pay | Admitting: Internal Medicine

## 2018-05-17 DIAGNOSIS — E782 Mixed hyperlipidemia: Secondary | ICD-10-CM

## 2018-05-17 LAB — THYROID PANEL WITH TSH
Free Thyroxine Index: 1.6 (ref 1.4–3.8)
T3 Uptake: 31 % (ref 22–35)
T4, Total: 5.3 ug/dL (ref 4.9–10.5)
TSH: 2.27 mIU/L (ref 0.40–4.50)

## 2018-05-17 NOTE — Telephone Encounter (Signed)
Key: [id number]

## 2018-05-18 MED ORDER — ROSUVASTATIN CALCIUM 10 MG PO TABS: 10.0000 mg | ORAL_TABLET | Freq: Every day | ORAL | 1 refills | Status: DC

## 2018-05-19 ENCOUNTER — Telehealth: Payer: Self-pay

## 2018-05-19 ENCOUNTER — Other Ambulatory Visit: Payer: Self-pay | Admitting: Internal Medicine

## 2018-05-19 DIAGNOSIS — E781 Pure hyperglyceridemia: Secondary | ICD-10-CM

## 2018-05-19 NOTE — Telephone Encounter (Signed)
PA for Vascepa 1g capsules was not approved. Is there an alternative?

## 2018-05-20 ENCOUNTER — Other Ambulatory Visit: Payer: Self-pay | Admitting: Internal Medicine

## 2018-05-20 DIAGNOSIS — E781 Pure hyperglyceridemia: Secondary | ICD-10-CM

## 2018-05-20 MED ORDER — OMEGA-3-ACID ETHYL ESTERS 1 G PO CAPS: 2.0000 g | ORAL_CAPSULE | Freq: Two times a day (BID) | ORAL | 1 refills | Status: DC

## 2018-05-20 NOTE — Telephone Encounter (Signed)
Changed RX sent to [facility]

## 2018-05-22 NOTE — Progress Notes (Signed)
[name] [address] [place], [place] [address] Phone: [phone] Subjective:     CC: Left shoulder pain  LKG:[id number]  Angel Combs is a 52 y.o. male coming in with complaint of left shoulder.  Found to have a rotator cuff tear.  Patient continues to have a dull, throbbing aching pain of the shoulder.  Patient has been trying to be conservative.  Continues to have unfortunately pain overall.  Discussed icing regimen but has not been doing it regularly either.     Past Medical History:  Diagnosis Date  . ALLERGIC RHINITIS CAUSE UNSPECIFIED   . Anxiety    using celxa.  [name] LACTOSE INTOLERANCE   . Left shoulder pain July '13   had tried accupuncture.   . Pain of left heel   . PATELLO-FEMORAL SYNDROME    sees chiropractor with good results using exercises.   Past Surgical History:  Procedure Laterality Date  . lasik  Augu 22, 2013  . NOSE SURGERY  1991   Social History   Socioeconomic History  . Marital status: Single    Spouse name: Not on file  . Number of children: 0  . Years of education: 62  . Highest education level: Not on file  Occupational History  . Occupation: Real Chief Technology Officer  Social Needs  . Financial resource strain: Not on file  . Food insecurity:    Worry: Not on file    Inability: Not on file  . Transportation needs:    Medical: Not on file    Non-medical: Not on file  Tobacco Use  . Smoking status: Never Smoker  . Smokeless tobacco: Never Used  Substance and Sexual Activity  . Alcohol use: Yes    Alcohol/week: 7.5 oz    Types: 15 drink(s) per week  . Drug use: No  . Sexual activity: Yes    Partners: Female  Lifestyle  . Physical activity:    Days per week: Not on file    Minutes per session: Not on file  . Stress: Not on file  Relationships  . Social connections:    Talks on phone: Not on file    Gets together: Not on file    Attends religious service: Not on file    Active member of club or  organization: Not on file    Attends meetings of clubs or organizations: Not on file    Relationship status: Not on file  Other Topics Concern  . Not on file  Social History Narrative   [employer], [employer]/ROTC. 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 No regular exercise program.   No Known Allergies Family History  Problem Relation Age of Onset  . Hyperlipidemia Father   . Hypertension Father   . Coronary artery disease Father   . Diabetes Maternal Grandmother   . Coronary artery disease Other   . Arthritis Mother        back pain  . Cancer Neg Hx   . Depression Neg Hx   . Early death Neg Hx   . Hearing loss Neg Hx   . Heart disease Neg Hx   . Kidney disease Neg Hx   . Stroke Neg Hx      Past medical history, social, surgical and family history all reviewed in electronic medical record.  No  pertanent information unless stated regarding to the chief complaint.   Review of Systems:Review of systems updated and as accurate as of 05/24/18  No headache, visual changes, nausea, vomiting, diarrhea, constipation, dizziness, abdominal pain, skin rash, fevers, chills, night sweats, weight loss, swollen lymph nodes, body aches, joint swelling,  chest pain, shortness of breath, mood changes.  Positive muscle aches  Objective  Blood pressure 126/80, pulse 71, height  (1.803 m), weight 176 lb (79.8 kg), SpO2 98 %. Systems examined below as of 05/24/18   General: No apparent distress alert and oriented x3 mood and affect normal, dressed appropriately.  HEENT: Pupils equal, extraocular movements intact  Respiratory: Patient's speak in full sentences and does not appear short of breath  Cardiovascular: No lower extremity edema, non tender, no erythema  Skin: Warm dry intact with no signs of infection or rash on extremities or on axial  skeleton.  Abdomen: Soft nontender  Neuro: Cranial nerves II through XII are intact, neurovascularly intact in all extremities with 2+ DTRs and 2+ pulses.  Lymph: No lymphadenopathy of posterior or anterior cervical chain or axillae bilaterally.  Gait normal with good balance and coordination.  MSK:  Non tender with full range of motion and good stability and symmetric strength and tone of shoulders, elbows, wrist, hip, knee and ankles bilaterally.  Left shoulder exam shows patient still has a positive impingement.  4 out of 5 strength on the left compared to contralateral side.  Positive impingement and O'Brien's noted.  Does have near full range of motion though.  Contralateral shoulder unremarkable  Procedure: Real-time Ultrasound Guided Injection of left glenohumeral joint Device: GE Logiq E  Ultrasound guided injection is preferred based studies that show increased duration, increased effect, greater accuracy, decreased procedural pain, increased response rate with ultrasound guided versus blind injection.  Verbal informed consent obtained.  Time-out conducted.  Noted no overlying erythema, induration, or other signs of local infection.  Skin prepped in a sterile fashion.  Local anesthesia: Topical Ethyl chloride.  With sterile technique and under real time ultrasound guidance:  Joint visualized.  21g 2 inch needle inserted posterior approach. Pictures taken for needle placement. Patient did have injection of 2 cc of 0.5% Marcaine, and 1cc of Kenalog 40 mg/dL. Completed without difficulty  Pain immediately resolved suggesting accurate placement of the medication.  Advised to call if fevers/chills, erythema, induration, drainage, or persistent bleeding.  Images permanently stored and available for review in the ultrasound unit.  Impression: Technically successful ultrasound guided injection.    Impression and Recommendations:     This case required medical decision making of moderate  complexity.      Note: This dictation was prepared with Dragon dictation along with smaller phrase technology. Any transcriptional errors that result from this process are unintentional.

## 2018-05-24 ENCOUNTER — Encounter: Payer: Self-pay | Admitting: Family Medicine

## 2018-05-24 ENCOUNTER — Ambulatory Visit (INDEPENDENT_AMBULATORY_CARE_PROVIDER_SITE_OTHER): Admitting: Family Medicine

## 2018-05-24 ENCOUNTER — Other Ambulatory Visit

## 2018-05-24 ENCOUNTER — Ambulatory Visit: Payer: Self-pay

## 2018-05-24 VITALS — BP 126/80 | HR 71 | Ht 71.0 in | Wt 176.0 lb

## 2018-05-24 DIAGNOSIS — G8929 Other chronic pain: Secondary | ICD-10-CM

## 2018-05-24 DIAGNOSIS — M25512 Pain in left shoulder: Secondary | ICD-10-CM

## 2018-05-24 DIAGNOSIS — S46012D Strain of muscle(s) and tendon(s) of the rotator cuff of left shoulder, subsequent encounter: Secondary | ICD-10-CM | POA: Diagnosis not present

## 2018-05-24 MED ORDER — DICLOFENAC SODIUM 2 % TD SOLN: TRANSDERMAL | 3 refills | Status: DC

## 2018-05-24 NOTE — Patient Instructions (Signed)
Good to see you  DHEA 50 mg daily for 4 weeks Ice is your friend.  Injected the shoulder  See me again in 3 weeks  Write me though again in 2 weeks and tell me if you want ot do PRP

## 2018-05-24 NOTE — Assessment & Plan Note (Signed)
Left shoulder was injected today with again no significant improvement with range of motion or with the conservative therapy at this point.  We discussed icing regimen, patient unable to tolerate the nitroglycerin patches.  Patient will continue the topical anti-inflammatory patient will then come back and see me again in 3 weeks.  We will consider PRP.  Patient has declined advanced imaging.

## 2018-05-25 ENCOUNTER — Other Ambulatory Visit (INDEPENDENT_AMBULATORY_CARE_PROVIDER_SITE_OTHER)

## 2018-05-25 ENCOUNTER — Encounter: Payer: Self-pay | Admitting: Internal Medicine

## 2018-05-25 ENCOUNTER — Other Ambulatory Visit: Payer: Self-pay | Admitting: Internal Medicine

## 2018-05-25 ENCOUNTER — Telehealth: Payer: Self-pay

## 2018-05-25 DIAGNOSIS — E782 Mixed hyperlipidemia: Secondary | ICD-10-CM | POA: Diagnosis not present

## 2018-05-25 LAB — LIPID PANEL
CHOL/HDL RATIO: 4
Cholesterol: 261 mg/dL — ABNORMAL HIGH (ref 0–200)
HDL: 60.4 mg/dL (ref >39.00)
LDL CALC: 180 mg/dL — AB (ref 0–99)
NONHDL: 201.03
Triglycerides: 105 mg/dL (ref 0.0–149.0)
VLDL: 21 mg/dL (ref 0.0–40.0)

## 2018-05-25 NOTE — Telephone Encounter (Signed)
pls forward To Z. Katrinka Blazing who wrote this RX

## 2018-05-25 NOTE — Telephone Encounter (Signed)
Copied from CRM [id number]. Topic: General - Other >> May 25, 2018 12:48 PM Elliot Gault wrote: Jethro Bolus name: Revonda Standard  Relation to pt: [facility]  Call back number: [phone]  Pharmacy: [facility], [place] - [place], [place] - [address] [phone] (Phone) [phone] (Fax)  Reason for call:  Due to patient insurance Tricare , patient unable to use coupon to receive discount and insurance will not cover Diclofenac Sodium (PENNSAID) 2 % SOLN therefore pharmacy requesting alternate, please advise  >> May 25, 2018 12:56 PM Elliot Gault wrote: Jethro Bolus name: Revonda Standard  Relation to pt: [facility]  Call back number: [phone]  Pharmacy: [facility], [place] - [place], [place] - [address] [phone] (Phone) [phone] (Fax)  Reason for call:  Due to patient insurance Tricare , patient unable to use coupon to receive discount and insurance will not cover Diclofenac Sodium (PENNSAID) 2 % SOLN therefore pharmacy requesting alternate, please advise  >> May 25, 2018  1:14 PM Claris Pong wrote: Send to [facility]? The pharmacy Dr. Katrinka Blazing uses.

## 2018-05-25 NOTE — Telephone Encounter (Signed)
Pt has Tricare and is not able to use the discount card/pharmacy. Is there an alternative that can be sent in?

## 2018-05-26 MED ORDER — DICLOFENAC SODIUM 1 % TD GEL: 2.0000 g | Freq: Four times a day (QID) | TRANSDERMAL | 11 refills | Status: DC

## 2018-05-26 NOTE — Telephone Encounter (Signed)
Sent in voltaren to [facility] and see if it is covered.

## 2018-05-26 NOTE — Telephone Encounter (Signed)
Pt informed of same.  

## 2018-05-28 ENCOUNTER — Encounter: Payer: Self-pay | Admitting: Internal Medicine

## 2018-06-15 LAB — COLOGUARD: Cologuard: NEGATIVE

## 2018-06-22 ENCOUNTER — Telehealth: Payer: Self-pay | Admitting: Internal Medicine

## 2018-06-22 NOTE — Telephone Encounter (Signed)
Copied from CRM [id number]. Topic: Quick Communication - Rx Refill/Question >> Jun 22, 2018  1:57 PM Jonette EvaBarksdale, Harvey B wrote: [place] is traveling [place] in the next few days and wanted to know if there is a Rx for motion sickness

## 2018-06-23 ENCOUNTER — Other Ambulatory Visit: Payer: Self-pay | Admitting: Internal Medicine

## 2018-06-23 MED ORDER — SCOPOLAMINE 1 MG/3DAYS TD PT72: 1.0000 | MEDICATED_PATCH | TRANSDERMAL | 0 refills | Status: DC

## 2018-06-23 NOTE — Telephone Encounter (Signed)
Pt informed rx has been sent in.  

## 2018-06-23 NOTE — Telephone Encounter (Signed)
Patient returned call and says "I am going abroad and want to know if something can be called in for motion sickness. I will be on a boat traveling and I don't want to be sick and drowsy from medications. I have bought OTC meclizine 25 mg and dimen-hydrimate 50 mg, but I don't want to take them because of the way they make me feel. I was wondering about a patch. Is there anyway he can call it in today? I will be leaving out tonight." I advised I will send this request and someone will call with Dr. Yetta BarreJones recommendation, her verbalized understanding.  [id number] [place] - [address] [facility] [address] [phone] (Phone) [phone] (Fax)

## 2018-06-23 NOTE — Telephone Encounter (Signed)
Message left on Vm to call back to discuss motion sickness request

## 2018-06-23 NOTE — Telephone Encounter (Signed)
RX sent to [facility]

## 2018-06-23 NOTE — Telephone Encounter (Signed)
Pt is rq'ing rx for motion sickness patches. Please advise if this can be sent in.

## 2018-07-05 ENCOUNTER — Encounter: Payer: Self-pay | Admitting: Internal Medicine

## 2018-07-05 NOTE — Progress Notes (Signed)
Outside notes received. Information abstracted. Notes sent to scan.  

## 2018-07-19 ENCOUNTER — Telehealth: Payer: Self-pay

## 2018-07-19 NOTE — Telephone Encounter (Signed)
Left message asking patient to call back to schedule nurse visit to get first shingrix---let tamara,RN at [facility] when appt is made so that both vaccines can be labeled

## 2018-08-02 ENCOUNTER — Encounter: Payer: Self-pay | Admitting: Family Medicine

## 2018-08-02 ENCOUNTER — Encounter: Payer: Self-pay | Admitting: Internal Medicine

## 2018-08-08 NOTE — Progress Notes (Signed)
Tawana ScaleZach Smith D.O. [facility] [address] [place], [place] [address] Phone: [phone] Subjective:      CC: Shoulder pain  FAO:[id number]:Subjective  Angel Combs is a 52 y.o. male coming in with complaint of  Left shoulder pain.  Has been found to have a rotator cuff tear.  X-rays have been negative.  Patient has not wanted to undergo a MRI.  Patient is here for PRP.    Past Medical History:  Diagnosis Date  . ALLERGIC RHINITIS CAUSE UNSPECIFIED   . Anxiety    using celxa.  [name]. LACTOSE INTOLERANCE   . Left shoulder pain July '13   had tried accupuncture.   . Pain of left heel   . PATELLO-FEMORAL SYNDROME    sees chiropractor with good results using exercises.   Past Surgical History:  Procedure Laterality Date  . lasik  Augu 22, 2013  . NOSE SURGERY  1991   Social History   Socioeconomic History  . Marital status: Single    Spouse name: Not on file  . Number of children: 0  . Years of education: 2924  . Highest education level: Not on file  Occupational History  . Occupation: Real Chief Technology Officerestate developer  Social Needs  . Financial resource strain: Not on file  . Food insecurity:    Worry: Not on file    Inability: Not on file  . Transportation needs:    Medical: Not on file    Non-medical: Not on file  Tobacco Use  . Smoking status: Never Smoker  . Smokeless tobacco: Never Used  Substance and Sexual Activity  . Alcohol use: Yes    Alcohol/week: 15.0 standard drinks    Types: 15 drink(s) per week  . Drug use: No  . Sexual activity: Yes    Partners: Female  Lifestyle  . Physical activity:    Days per week: Not on file    Minutes per session: Not on file  . Stress: Not on file  Relationships  . Social connections:    Talks on phone: Not on file    Gets together: Not on file    Attends religious service: Not on file    Active member of club or organization: Not on file    Attends meetings of clubs or organizations: Not on file    Relationship status: Not  on file  Other Topics Concern  . Not on file  Social History Narrative   HSG, [employer]/ROTC. BS, MBA, MA-geography, MA foreign affairs  [place], MS- MIS. 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 No regular exercise program.   No Known Allergies Family History  Problem Relation Age of Onset  . Hyperlipidemia Father   . Hypertension Father   . Coronary artery disease Father   . Diabetes Maternal Grandmother   . Coronary artery disease Other   . Arthritis Mother        back pain  . Cancer Neg Hx   . Depression Neg Hx   . Early death Neg Hx   . Hearing loss Neg Hx   . Heart disease Neg Hx   . Kidney disease Neg Hx   . Stroke Neg Hx      Past medical history, social, surgical and family history all reviewed in electronic medical record.  No pertanent information unless stated regarding to the chief complaint.   Review of Systems:Review of systems  updated and as accurate as of 08/09/18  No headache, visual changes, nausea, vomiting, diarrhea, constipation, dizziness, abdominal pain, skin rash, fevers, chills, night sweats, weight loss, swollen lymph nodes, body aches, joint swelling, , chest pain, shortness of breath, mood changes.  Positive muscle aches  Objective  Height 5\' 11"  (1.803 m). Systems examined below as of 08/09/18   General: No apparent distress alert and oriented x3 mood and affect normal, dressed appropriately.  HEENT: Pupils equal, extraocular movements intact  Respiratory: Patient's speak in full sentences and does not appear short of breath  Cardiovascular: No lower extremity edema, non tender, no erythema  Skin: Warm dry intact with no signs of infection or rash on extremities or on axial skeleton.  Abdomen: Soft nontender  Neuro: Cranial nerves II through XII are intact, neurovascularly intact in all extremities with 2+ DTRs and 2+ pulses.  Lymph: No lymphadenopathy  of posterior or anterior cervical chain or axillae bilaterally.  Gait normal with good balance and coordination.  MSK:  Non tender with full range of motion and good stability and symmetric strength and tone of , elbows, wrist, hip, knee and ankles bilaterally.  Shoulder: Left Inspection reveals no abnormalities, atrophy or asymmetry. Palpation is normal with no tenderness over AC joint or bicipital groove. ROM is full in all planes. Rotator cuff strength 4 out of 5 compared to the contralateral side positive impingement noted Speeds and Yergason's tests normal. Positive O'Brien's Normal scapular function observed. No painful arc and no drop arm sign. No apprehension sign Contralateral shoulder unremarkable  Procedure: Real-time Ultrasound Guided Injection of left glenohumeral joint Device: GE Logiq E  Ultrasound guided injection is preferred based studies that show increased duration, increased effect, greater accuracy, decreased procedural pain, increased response rate with ultrasound guided versus blind injection.  Verbal informed consent obtained.  Time-out conducted.  Noted no overlying erythema, induration, or other signs of local infection.  Skin prepped in a sterile fashion.  Local anesthesia: Topical Ethyl chloride.  With sterile technique and under real time ultrasound guidance:  Joint visualized.  21g 2 inch needle inserted posterior approach. Pictures taken for needle placement. Patient did have injection of 2 cc of 0.5% Marcaine, and injected 4 cc of pre-centrifuge PRP. Completed without difficulty  Pain immediately resolved suggesting accurate placement of the medication.  Advised to call if fevers/chills, erythema, induration, drainage, or persistent bleeding.  Images permanently stored and available for review in the ultrasound unit.  Impression: Technically successful ultrasound guided injection.     Impression and Recommendations:     This case required medical  decision making of moderate complexity.      Note: This dictation was prepared with Dragon dictation along with smaller phrase technology. Any transcriptional errors that result from this process are unintentional.

## 2018-08-09 ENCOUNTER — Ambulatory Visit (INDEPENDENT_AMBULATORY_CARE_PROVIDER_SITE_OTHER): Payer: Self-pay | Admitting: Family Medicine

## 2018-08-09 ENCOUNTER — Ambulatory Visit: Payer: Self-pay

## 2018-08-09 ENCOUNTER — Other Ambulatory Visit

## 2018-08-09 VITALS — Ht 71.0 in

## 2018-08-09 DIAGNOSIS — G8929 Other chronic pain: Secondary | ICD-10-CM

## 2018-08-09 DIAGNOSIS — M25512 Pain in left shoulder: Secondary | ICD-10-CM

## 2018-08-09 DIAGNOSIS — S46012D Strain of muscle(s) and tendon(s) of the rotator cuff of left shoulder, subsequent encounter: Secondary | ICD-10-CM

## 2018-08-09 NOTE — Patient Instructions (Signed)
Lok at the work out routine.  Avoid ice and ibuprofen next 3 days if you can  See me again in 3 weeks

## 2018-08-09 NOTE — Assessment & Plan Note (Signed)
High-grade supraspinatus tear noted with no significant displacement or retraction.  Patient had PRP done today.  Will start 6-week protocol to increase activity slowly.  Follow-up again in 3 weeks.

## 2018-08-14 ENCOUNTER — Encounter: Payer: Self-pay | Admitting: Internal Medicine

## 2018-08-26 ENCOUNTER — Encounter: Payer: Self-pay | Admitting: Family Medicine

## 2018-08-28 NOTE — Progress Notes (Signed)
Angel Combs [address] [place], [place] [address] Phone: [phone] Subjective:       I Angel Combs am serving as a Neurosurgeon for Dr. Antoine Primas.  CC: Left shoulder pain follow-up  [id number]  Angel Combs is a 52 y.o. male coming in with complaint of left shoulder pain. States that the shoulder bothers him at [place] since last weekt. The old pain he was previously having is back. Has been taking light jogs while bracing the shoulder. Has been exercising. Now has right shoulder pain.   Patient has been seen by me and did have a rotator cuff tear.  Was given a PRP injection 3 weeks ago.  States the first 2 weeks was doing significantly better in the last week increasing pain again.  States that is having difficulty with range of motion.  Noticing some weakness that he has not had in quite some time.     Past Medical History:  Diagnosis Date  . ALLERGIC RHINITIS CAUSE UNSPECIFIED   . Anxiety    using celxa.  [name] LACTOSE INTOLERANCE   . Left shoulder pain July '13   had tried accupuncture.   . Pain of left heel   . PATELLO-FEMORAL SYNDROME    sees chiropractor with good results using exercises.   Past Surgical History:  Procedure Laterality Date  . lasik  Augu 22, 2013  . NOSE SURGERY  1991   Social History   Socioeconomic History  . Marital status: Single    Spouse name: Not on file  . Number of children: 0  . Years of education: 60  . Highest education level: Not on file  Occupational History  . Occupation: Real Chief Technology Officer  Social Needs  . Financial resource strain: Not on file  . Food insecurity:    Worry: Not on file    Inability: Not on file  . Transportation needs:    Medical: Not on file    Non-medical: Not on file  Tobacco Use  . Smoking status: Never Smoker  . Smokeless tobacco: Never Used  Substance and Sexual Activity  . Alcohol use: Yes    Alcohol/week: 15.0 standard drinks    Types: 15 drink(s) per week  .  Drug use: No  . Sexual activity: Yes    Partners: Female  Lifestyle  . Physical activity:    Days per week: Not on file    Minutes per session: Not on file  . Stress: Not on file  Relationships  . Social connections:    Talks on phone: Not on file    Gets together: Not on file    Attends religious service: Not on file    Active member of club or organization: Not on file    Attends meetings of clubs or organizations: Not on file    Relationship status: Not on file  Other Topics Concern  . Not on file  Social History Narrative   HSG, [employer]/ROTC. BS, MBA, MA-geography, MA foreign affairs  [place], MS- MIS. 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 No regular exercise program.   No Known Allergies Family History  Problem Relation Age of Onset  . Hyperlipidemia Father   . Hypertension Father   . Coronary artery disease Father   . Diabetes Maternal Grandmother   . Coronary artery disease Other   . Arthritis Mother  back pain  . Cancer Neg Hx   . Depression Neg Hx   . Early death Neg Hx   . Hearing loss Neg Hx   . Heart disease Neg Hx   . Kidney disease Neg Hx   . Stroke Neg Hx      Current Outpatient Medications (Cardiovascular):  .  omega-3 acid ethyl esters (LOVAZA) 1 g capsule, Take 2 capsules (2 g total) by mouth 2 (two) times daily. .  rosuvastatin (CRESTOR) 10 MG tablet, Take 1 tablet (10 mg total) by mouth daily.  Current Outpatient Medications (Respiratory):  .  fluticasone (FLONASE) 50 MCG/ACT nasal spray, Place 2 sprays into both nostrils daily.    Current Outpatient Medications (Other):  .  citalopram (CELEXA) 10 MG tablet, Take 1 tablet (10 mg total) by mouth daily. (Patient taking differently: Take 10 mg by mouth daily. Taking every other day) .  diclofenac sodium (VOLTAREN) 1 % GEL, Apply 2 g topically 4 (four) times daily. [name]  scopolamine  (TRANSDERM-SCOP, 1.5 MG,) 1 MG/3DAYS, Place 1 patch (1.5 mg total) onto the skin every 3 (three) days.    Past medical history, social, surgical and family history all reviewed in electronic medical record.  No pertanent information unless stated regarding to the chief complaint.   Review of Systems:  No headache, visual changes, nausea, vomiting, diarrhea, constipation, dizziness, abdominal pain, skin rash, fevers, chills, night sweats, weight loss, swollen lymph nodes, body aches, joint swelling, muscle aches, chest pain, shortness of breath, mood changes.   Objective  There were no vitals taken for this visit. Systems examined below as of    General: No apparent distress alert and oriented x3 mood and affect normal, dressed appropriately.  HEENT: Pupils equal, extraocular movements intact  Respiratory: Patient's speak in full sentences and does not appear short of breath  Cardiovascular: No lower extremity edema, non tender, no erythema  Skin: Warm dry intact with no signs of infection or rash on extremities or on axial skeleton.  Abdomen: Soft nontender  Neuro: Cranial nerves II through XII are intact, neurovascularly intact in all extremities with 2+ DTRs and 2+ pulses.  Lymph: No lymphadenopathy of posterior or anterior cervical chain or axillae bilaterally.  Gait normal with good balance and coordination.  MSK:  Non tender with full range of motion and good stability and symmetric strength and tone of  elbows, wrist, hip, knee and ankles bilaterally.  Shoulder: Left Inspection reveals no abnormalities, atrophy or asymmetry. Palpation is normal with no tenderness over AC joint or bicipital groove. ROM is full in all planes. Rotator cuff strength 3-5 which is worse than previous exam Positive impingement Speeds and Yergason's tests normal. Positive O'Brien's Normal scapular function observed. Positive painful arc No apprehension sign Contralateral shoulder unremarkable  MSK  US performed of: Left shoulder This study was ordered, performed, and interpreted by Terrilee Files D.O.  Shoulder:   Supraspinatus: Previous area tear seems to have some scar tissue but patient does have what appears to be in a new tear with some mild retraction superficial. AC joint: Arthritic. Glenohumeral Joint:  Appears normal without effusion. Glenoid Labrum:  Intact without visualized tears. Biceps Tendon:  Appears normal on long and transverse views, no fraying of tendon, tendon located in intertubercular groove, no subluxation with shoulder internal or external rotation. No increased power doppler signal. Impression: Rotator cuff tear that appears to be new      Impression and Recommendations:     This case required medical decision  making of moderate complexity. The above documentation has been reviewed and is accurate and complete Angel Pulley, DO       Note: This dictation was prepared with Dragon dictation along with smaller phrase technology. Any transcriptional errors that result from this process are unintentional.

## 2018-08-30 ENCOUNTER — Ambulatory Visit: Payer: Self-pay

## 2018-08-30 ENCOUNTER — Ambulatory Visit (INDEPENDENT_AMBULATORY_CARE_PROVIDER_SITE_OTHER): Admitting: Family Medicine

## 2018-08-30 ENCOUNTER — Encounter: Payer: Self-pay | Admitting: Family Medicine

## 2018-08-30 VITALS — BP 120/80 | HR 74 | Ht 71.0 in | Wt 171.0 lb

## 2018-08-30 DIAGNOSIS — M25512 Pain in left shoulder: Principal | ICD-10-CM

## 2018-08-30 DIAGNOSIS — M25511 Pain in right shoulder: Secondary | ICD-10-CM

## 2018-08-30 DIAGNOSIS — G8929 Other chronic pain: Secondary | ICD-10-CM

## 2018-08-30 DIAGNOSIS — Z23 Encounter for immunization: Secondary | ICD-10-CM | POA: Diagnosis not present

## 2018-08-30 DIAGNOSIS — S46012D Strain of muscle(s) and tendon(s) of the rotator cuff of left shoulder, subsequent encounter: Secondary | ICD-10-CM | POA: Diagnosis not present

## 2018-08-30 NOTE — Patient Instructions (Signed)
Good to see you  We will get MR-arthrogram of the shoulder Sorry for the set back  Ok to run but wear the sling to remind you and protect you  I will write you when I get the results and discuss the next steps

## 2018-08-30 NOTE — Assessment & Plan Note (Addendum)
Left rotator cuff tear.  Seems to be having worsening pain again.  I am concerned the patient has a tear that is noted.  Significant hypoechoic changes.  I would like patient to get an MR arthrogram done before we investigate more time and effort in the conservative therapy.  Patient is in agreement with the plan.  Patient will follow-up with me again in 3 to 4 weeks.  Sling given for comfort at this time. Spent  25 minutes with patient face-to-face and had greater than 50% of counseling including as described above in assessment and plan.

## 2018-09-07 ENCOUNTER — Ambulatory Visit (INDEPENDENT_AMBULATORY_CARE_PROVIDER_SITE_OTHER)

## 2018-09-07 DIAGNOSIS — Z23 Encounter for immunization: Secondary | ICD-10-CM | POA: Diagnosis not present

## 2018-09-21 ENCOUNTER — Encounter: Payer: Self-pay | Admitting: Family Medicine

## 2018-09-21 NOTE — Telephone Encounter (Signed)
Copied from CRM [id number]. Topic: Referral - Request >> Sep 21, 2018  2:16 PM Tamela Oddi, NT wrote: Reason for CRM: patient called and states he would suppose to be sent to [facility] imaging for his MRI and was sent to [facility]. Pt is wondering can you re send the order to [facility] imaging. He is also requesting an open MRI machine  Please call patient when this order/ referral is corrected. CB# [phone]

## 2018-10-10 ENCOUNTER — Other Ambulatory Visit: Payer: Self-pay | Admitting: *Deleted

## 2018-10-10 DIAGNOSIS — S43432A Superior glenoid labrum lesion of left shoulder, initial encounter: Secondary | ICD-10-CM

## 2018-11-11 ENCOUNTER — Encounter: Payer: Self-pay | Admitting: Internal Medicine

## 2018-11-11 ENCOUNTER — Encounter: Payer: Self-pay | Admitting: Family Medicine

## 2018-11-14 ENCOUNTER — Other Ambulatory Visit: Payer: Self-pay | Admitting: Internal Medicine

## 2018-11-14 DIAGNOSIS — E782 Mixed hyperlipidemia: Secondary | ICD-10-CM

## 2018-11-14 DIAGNOSIS — I10 Essential (primary) hypertension: Secondary | ICD-10-CM

## 2018-11-14 DIAGNOSIS — E781 Pure hyperglyceridemia: Secondary | ICD-10-CM

## 2018-11-16 NOTE — Progress Notes (Signed)
[name] [address] [place], [place] [address] Phone: [phone] Subjective:    I Angel Combs am serving as a Neurosurgeon for Dr. Antoine Primas.   CC: Left shoulder pain  BJY:[id number]  Angel Combs is a 52 y.o. male coming in with complaint of left shoulder pain. Wants a second opinion for the shoulder.   Patient does have a rotator cuff tear with 2.5 cm of retraction.  Patient states that he is seen 2 different orthopedic surgeons to discuss in both of them have recommended surgery.  Patient wants to know if there is any other alternatives.  Patient states that he does have a dull aching pain.  Able to do a lot of activities but unable to lift much with that left arm.  Has been to the gym and continues to try to be active.  Patient is wondering what he would benefit from surgery if he would feel significantly better or just a little bit.    Past Medical History:  Diagnosis Date  . ALLERGIC RHINITIS CAUSE UNSPECIFIED   . Anxiety    using celxa.  [name] LACTOSE INTOLERANCE   . Left shoulder pain July '13   had tried accupuncture.   . Pain of left heel   . PATELLO-FEMORAL SYNDROME    sees chiropractor with good results using exercises.   Past Surgical History:  Procedure Laterality Date  . lasik  Augu 22, 2013  . NOSE SURGERY  1991   Social History   Socioeconomic History  . Marital status: Single    Spouse name: Not on file  . Number of children: 0  . Years of education: 16  . Highest education level: Not on file  Occupational History  . Occupation: Real Chief Technology Officer  Social Needs  . Financial resource strain: Not on file  . Food insecurity:    Worry: Not on file    Inability: Not on file  . Transportation needs:    Medical: Not on file    Non-medical: Not on file  Tobacco Use  . Smoking status: Never Smoker  . Smokeless tobacco: Never Used  Substance and Sexual Activity  . Alcohol use: Yes    Alcohol/week: 15.0 standard drinks     Types: 15 drink(s) per week  . Drug use: No  . Sexual activity: Yes    Partners: Female  Lifestyle  . Physical activity:    Days per week: Not on file    Minutes per session: Not on file  . Stress: Not on file  Relationships  . Social connections:    Talks on phone: Not on file    Gets together: Not on file    Attends religious service: Not on file    Active member of club or organization: Not on file    Attends meetings of clubs or organizations: Not on file    Relationship status: Not on file  Other Topics Concern  . Not on file  Social History Narrative   HSG, [employer]/ROTC. BS, MBA, MA-geography, MA foreign affairs  [place], MS- MIS. 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 No regular exercise program.   No Known Allergies Family History  Problem Relation Age of Onset  . Hyperlipidemia Father   . Hypertension Father   . Coronary artery disease Father   . Diabetes Maternal Grandmother   . Coronary artery disease Other   .  Arthritis Mother        back pain  . Cancer Neg Hx   . Depression Neg Hx   . Early death Neg Hx   . Hearing loss Neg Hx   . Heart disease Neg Hx   . Kidney disease Neg Hx   . Stroke Neg Hx      Current Outpatient Medications (Cardiovascular):  .  omega-3 acid ethyl esters (LOVAZA) 1 g capsule, Take 2 capsules (2 g total) by mouth 2 (two) times daily.  Current Outpatient Medications (Respiratory):  .  fluticasone (FLONASE) 50 MCG/ACT nasal spray, Place 2 sprays into both nostrils daily.    Current Outpatient Medications (Other):  .  citalopram (CELEXA) 10 MG tablet, Take 1 tablet (10 mg total) by mouth daily. (Patient taking differently: Take 10 mg by mouth daily. Taking every other day) .  diclofenac sodium (VOLTAREN) 1 % GEL, Apply 2 g topically 4 (four) times daily. (Patient not taking: Reported on 11/17/2018) .  scopolamine (TRANSDERM-SCOP,  1.5 MG,) 1 MG/3DAYS, Place 1 patch (1.5 mg total) onto the skin every 3 (three) days. (Patient not taking: Reported on 11/17/2018)    Past medical history, social, surgical and family history all reviewed in electronic medical record.  No pertanent information unless stated regarding to the chief complaint.   Review of Systems:  No headache, visual changes, nausea, vomiting, diarrhea, constipation, dizziness, abdominal pain, skin rash, fevers, chills, night sweats, weight loss, swollen lymph nodes, body aches, joint swelling, chest pain, shortness of breath, mood changes.  Positive muscle aches  Objective  Blood pressure 140/80, pulse (!) 58, height 5\' 11"  (1.803 m), weight 173 lb (78.5 kg), SpO2 96 %.    General: No apparent distress alert and oriented x3 mood and affect normal, dressed appropriately.  HEENT: Pupils equal, extraocular movements intact  Respiratory: Patient's speak in full sentences and does not appear short of breath  Cardiovascular: No lower extremity edema, non tender, no erythema  Skin: Warm dry intact with no signs of infection or rash on extremities or on axial skeleton.  Abdomen: Soft nontender  Neuro: Cranial nerves II through XII are intact, neurovascularly intact in all extremities with 2+ DTRs and 2+ pulses.  Lymph: No lymphadenopathy of posterior or anterior cervical chain or axillae bilaterally.  Gait normal with good balance and coordination.  MSK:  Non tender with full range of motion and good stability and symmetric strength and tone of  elbows, wrist, hip, knee and ankles bilaterally.  Left shoulder exam shows the patient does have full range of motion.  No significant abnormality on inspection.  Rotator cuff strength 4 out of 5 compared to the contralateral side.  Positive impingement.  Positive O'Brien's test.  Neurovascular intact distally.      Impression and Recommendations:      The above documentation has been reviewed and is accurate and  complete Angel SaaZachary M Deniro Laymon, DO       Note: This dictation was prepared with Dragon dictation along with smaller phrase technology. Any transcriptional errors that result from this process are unintentional.

## 2018-11-17 ENCOUNTER — Encounter: Payer: Self-pay | Admitting: Internal Medicine

## 2018-11-17 ENCOUNTER — Ambulatory Visit (INDEPENDENT_AMBULATORY_CARE_PROVIDER_SITE_OTHER): Admitting: Family Medicine

## 2018-11-17 ENCOUNTER — Encounter: Payer: Self-pay | Admitting: Family Medicine

## 2018-11-17 ENCOUNTER — Other Ambulatory Visit (INDEPENDENT_AMBULATORY_CARE_PROVIDER_SITE_OTHER)

## 2018-11-17 ENCOUNTER — Ambulatory Visit (INDEPENDENT_AMBULATORY_CARE_PROVIDER_SITE_OTHER): Admitting: *Deleted

## 2018-11-17 DIAGNOSIS — S46012D Strain of muscle(s) and tendon(s) of the rotator cuff of left shoulder, subsequent encounter: Secondary | ICD-10-CM | POA: Diagnosis not present

## 2018-11-17 DIAGNOSIS — E781 Pure hyperglyceridemia: Secondary | ICD-10-CM | POA: Diagnosis not present

## 2018-11-17 DIAGNOSIS — I10 Essential (primary) hypertension: Secondary | ICD-10-CM | POA: Diagnosis not present

## 2018-11-17 DIAGNOSIS — E782 Mixed hyperlipidemia: Secondary | ICD-10-CM

## 2018-11-17 DIAGNOSIS — Z23 Encounter for immunization: Secondary | ICD-10-CM | POA: Diagnosis not present

## 2018-11-17 LAB — LIPID PANEL
Cholesterol: 260 mg/dL — ABNORMAL HIGH (ref 0–200)
HDL: 58.2 mg/dL (ref >39.00)
NONHDL: 202.27
Total CHOL/HDL Ratio: 4
Triglycerides: 267 mg/dL — ABNORMAL HIGH (ref 0.0–149.0)
VLDL: 53.4 mg/dL — ABNORMAL HIGH (ref 0.0–40.0)

## 2018-11-17 LAB — BASIC METABOLIC PANEL
BUN: 13 mg/dL (ref 6–23)
CALCIUM: 9.4 mg/dL (ref 8.4–10.5)
CHLORIDE: 101 meq/L (ref 96–112)
CO2: 26 mEq/L (ref 19–32)
CREATININE: 1.37 mg/dL (ref 0.40–1.50)
GFR: 58.3 mL/min — ABNORMAL LOW (ref >60.00)
Glucose, Bld: 84 mg/dL (ref 70–99)
POTASSIUM: 3.9 meq/L (ref 3.5–5.1)
SODIUM: 137 meq/L (ref 135–145)

## 2018-11-17 LAB — LDL CHOLESTEROL, DIRECT: LDL DIRECT: 80 mg/dL

## 2018-11-17 NOTE — Assessment & Plan Note (Signed)
Patient has a rotator cuff tear.  Retraction noted.  Patient was doing better with the PRP but then I feel had an acute tear.  We discussed different treatment options including leaving it as it is in the long-term prognosis.  We also discussed the possibility of repeating PRP.  We also discussed that likelihood from patient's activity level I do feel that patient undergoing surgery would probably benefit him the most.  All patient's questions were answered today.  Patient can call with any other questions.  Spent  25 minutes with patient face-to-face and had greater than 50% of counseling including as described above in assessment and plan.

## 2018-11-17 NOTE — Patient Instructions (Addendum)
Good to see you  I think it is worth trying to fix the rotator cuff  I am here for any questions  You will do great!

## 2018-11-18 ENCOUNTER — Encounter: Payer: Self-pay | Admitting: Family Medicine

## 2018-11-18 ENCOUNTER — Ambulatory Visit (INDEPENDENT_AMBULATORY_CARE_PROVIDER_SITE_OTHER): Admitting: Family Medicine

## 2018-11-18 VITALS — BP 120/80 | HR 64 | Ht 71.0 in | Wt 175.0 lb

## 2018-11-18 DIAGNOSIS — M545 Low back pain, unspecified: Secondary | ICD-10-CM | POA: Insufficient documentation

## 2018-11-18 MED ORDER — KETOROLAC TROMETHAMINE 60 MG/2ML IM SOLN
60.0000 mg | Freq: Once | INTRAMUSCULAR | Status: AC
  Administered 2018-11-18: 60 mg via INTRAMUSCULAR

## 2018-11-18 NOTE — Progress Notes (Signed)
Angel Combs - 52 y.o. male MRN [id number]  Date of birth: January 21, 1966  SUBJECTIVE:  Including CC & ROS.  Chief Complaint  Patient presents with  . Back Pain   I Angel Combs am serving as a Neurosurgeon for Dr. Clare Combs.    Angel Combs is a 52 y.o. male that is here for acute back pain. Has been driving a lot lately. About 600 miles a day. Yesterday afternoon around lunch back pain was so bad he could barely move. Bilateral thoracic back pain. Using ice and heat. Pain has gotten better this morning.  Denies any inciting event.  Pain is moderate to severe and intermittent.  Has been taking ibuprofen.  Denies any radicular symptoms.  No history of similar pain.  Denies any radiation to the groin.    Review of Systems  Constitutional: Negative for fever.  HENT: Negative for congestion.   Respiratory: Negative for cough.   Cardiovascular: Negative for chest pain.  Gastrointestinal: Negative for abdominal pain.  Musculoskeletal: Positive for back pain.  Skin: Negative for color change.  Neurological: Negative for weakness.  Hematological: Negative for adenopathy.  Psychiatric/Behavioral: Negative for agitation.    HISTORY: Past Medical, Surgical, Social, and Family History Reviewed & Updated per EMR.   Pertinent Historical Findings include:  Past Medical History:  Diagnosis Date  . ALLERGIC RHINITIS CAUSE UNSPECIFIED   . Anxiety    using celxa.  [name] LACTOSE INTOLERANCE   . Left shoulder pain July '13   had tried accupuncture.   . Pain of left heel   . PATELLO-FEMORAL SYNDROME    sees chiropractor with good results using exercises.    Past Surgical History:  Procedure Laterality Date  . lasik  Augu 22, 2013  . NOSE SURGERY  1991    No Known Allergies  Family History  Problem Relation Age of Onset  . Hyperlipidemia Father   . Hypertension Father   . Coronary artery disease Father   . Diabetes Maternal Grandmother   . Coronary artery disease Other   . Arthritis  Mother        back pain  . Cancer Neg Hx   . Depression Neg Hx   . Early death Neg Hx   . Hearing loss Neg Hx   . Heart disease Neg Hx   . Kidney disease Neg Hx   . Stroke Neg Hx      Social History   Socioeconomic History  . Marital status: Single    Spouse name: Not on file  . Number of children: 0  . Years of education: 87  . Highest education level: Not on file  Occupational History  . Occupation: Real Chief Technology Officer  Social Needs  . Financial resource strain: Not on file  . Food insecurity:    Worry: Not on file    Inability: Not on file  . Transportation needs:    Medical: Not on file    Non-medical: Not on file  Tobacco Use  . Smoking status: Never Smoker  . Smokeless tobacco: Never Used  Substance and Sexual Activity  . Alcohol use: Yes    Alcohol/week: 15.0 standard drinks    Types: 15 drink(s) per week  . Drug use: No  . Sexual activity: Yes    Partners: Female  Lifestyle  . Physical activity:    Days per week: Not on file    Minutes per session: Not on file  . Stress: Not on file  Relationships  . Social connections:  Talks on phone: Not on file    Gets together: Not on file    Attends religious service: Not on file    Active member of club or organization: Not on file    Attends meetings of clubs or organizations: Not on file    Relationship status: Not on file  . Intimate partner violence:    Fear of current or ex partner: Not on file    Emotionally abused: Not on file    Physically abused: Not on file    Forced sexual activity: Not on file  Other Topics Concern  . Not on file  Social History Narrative   HSG, [employer]/ROTC. BS, MBA, MA-geography, MA foreign affairs  [employer], MS- MIS. 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 No regular exercise program.     PHYSICAL EXAM:  VS: BP 120/80 (BP Location: Left Arm, Patient Position: Sitting)    Pulse 64   Ht 5\' 11"  (1.803 m)   Wt 175 lb (79.4 kg)   SpO2 99%   BMI 24.41 kg/m  Physical Exam Gen: NAD, alert, cooperative with exam, well-appearing ENT: normal lips, normal nasal mucosa,  Eye: normal EOM, normal conjunctiva and lids CV:  no edema, +2 pedal pulses   Resp: no accessory muscle use, non-labored,  Skin: no rashes, no areas of induration  Neuro: normal tone, normal sensation to touch Psych:  normal insight, alert and oriented MSK:  Back:  Fullness appreciated upon palpation of the thoracic back muscles. Left>right  Pain with flexion and extension  Normal strength resistance with hip flexion, Normal internal and external rotation of the hips. Negative straight leg raise bilaterally. Neurovascular intact     ASSESSMENT & PLAN:   Acute left-sided low back pain Symptoms seem to be consistent with a spasm.  Likely related to be postural with excessive driving that he has been doing.  Does not appear to be nephrolithiasis or any nerve impingement. -Toradol injection today. -Counseled on home exercise therapy and supportive care. -If no improvement can consider manipulation or trigger point injections.  Consider imaging or physical therapy.    The above documentation has been reviewed and is accurate and complete. Angel Combs<Angel Gillihan, MD 11/18/2018, 10:11 AM>

## 2018-11-18 NOTE — Assessment & Plan Note (Signed)
Symptoms seem to be consistent with a spasm.  Likely related to be postural with excessive driving that he has been doing.  Does not appear to be nephrolithiasis or any nerve impingement. -Toradol injection today. -Counseled on home exercise therapy and supportive care. -If no improvement can consider manipulation or trigger point injections.  Consider imaging or physical therapy.

## 2018-11-18 NOTE — Patient Instructions (Signed)
Nice to meet you  Please try the exercises  Please try heat or ice on the back  Please try a lacrosse ball or a thera cane to massage the back Please see me back in 2-3 weeks if no better.

## 2018-11-27 ENCOUNTER — Encounter: Payer: Self-pay | Admitting: Family Medicine

## 2018-11-28 ENCOUNTER — Encounter: Payer: Self-pay | Admitting: Family Medicine

## 2018-11-28 ENCOUNTER — Ambulatory Visit: Admitting: Internal Medicine

## 2018-11-29 ENCOUNTER — Other Ambulatory Visit (INDEPENDENT_AMBULATORY_CARE_PROVIDER_SITE_OTHER)

## 2018-11-29 ENCOUNTER — Encounter: Payer: Self-pay | Admitting: Internal Medicine

## 2018-11-29 ENCOUNTER — Ambulatory Visit (INDEPENDENT_AMBULATORY_CARE_PROVIDER_SITE_OTHER): Admitting: Internal Medicine

## 2018-11-29 VITALS — BP 124/82 | HR 66 | Temp 97.3°F | Resp 16 | Ht 71.0 in | Wt 177.2 lb

## 2018-11-29 DIAGNOSIS — J301 Allergic rhinitis due to pollen: Secondary | ICD-10-CM | POA: Diagnosis not present

## 2018-11-29 DIAGNOSIS — E782 Mixed hyperlipidemia: Secondary | ICD-10-CM | POA: Diagnosis not present

## 2018-11-29 DIAGNOSIS — R197 Diarrhea, unspecified: Secondary | ICD-10-CM | POA: Diagnosis not present

## 2018-11-29 DIAGNOSIS — I10 Essential (primary) hypertension: Secondary | ICD-10-CM

## 2018-11-29 LAB — CBC WITH DIFFERENTIAL/PLATELET
BASOS ABS: 0 10*3/uL (ref 0.0–0.1)
Basophils Relative: 0.3 % (ref 0.0–3.0)
Eosinophils Absolute: 0.1 10*3/uL (ref 0.0–0.7)
Eosinophils Relative: 1.7 % (ref 0.0–5.0)
HCT: 40.5 % (ref 39.0–52.0)
Hemoglobin: 14 g/dL (ref 13.0–17.0)
Lymphocytes Relative: 34 % (ref 12.0–46.0)
Lymphs Abs: 2.4 10*3/uL (ref 0.7–4.0)
MCHC: 34.5 g/dL (ref 30.0–36.0)
MCV: 91.7 fl (ref 78.0–100.0)
Monocytes Absolute: 0.5 10*3/uL (ref 0.1–1.0)
Monocytes Relative: 6.8 % (ref 3.0–12.0)
Neutro Abs: 4.1 10*3/uL (ref 1.4–7.7)
Neutrophils Relative %: 57.2 % (ref 43.0–77.0)
Platelets: 271 10*3/uL (ref 150.0–400.0)
RBC: 4.42 Mil/uL (ref 4.22–5.81)
RDW: 12.4 % (ref 11.5–15.5)
WBC: 7.1 10*3/uL (ref 4.0–10.5)

## 2018-11-29 MED ORDER — ATORVASTATIN CALCIUM 10 MG PO TABS: 10.0000 mg | ORAL_TABLET | Freq: Every day | ORAL | 1 refills | Status: DC

## 2018-11-29 MED ORDER — LEVOCETIRIZINE DIHYDROCHLORIDE 5 MG PO TABS: 5.0000 mg | ORAL_TABLET | Freq: Every evening | ORAL | 1 refills | Status: DC

## 2018-11-29 MED ORDER — FLUTICASONE PROPIONATE 50 MCG/ACT NA SUSP: 2.0000 | Freq: Every day | NASAL | 1 refills | Status: DC

## 2018-11-29 NOTE — Patient Instructions (Signed)
Diarrhea, Adult Diarrhea is frequent loose and watery bowel movements. Diarrhea can make you feel weak and cause you to become dehydrated. Dehydration can make you tired and thirsty, cause you to have a dry mouth, and decrease how often you urinate. Diarrhea typically lasts 2-3 days. However, it can last longer if it is a sign of something more serious. It is important to treat your diarrhea as told by your health care provider. Follow these instructions at home: Eating and drinking  Follow these recommendations as told by your health care provider:  Take an oral rehydration solution (ORS). This is a drink that is sold at pharmacies and retail stores.  Drink clear fluids, such as water, ice chips, diluted fruit juice, and low-calorie sports drinks.  Eat bland, easy-to-digest foods in small amounts as you are able. These foods include bananas, applesauce, rice, lean meats, toast, and crackers.  Avoid drinking fluids that contain a lot of sugar or caffeine, such as energy drinks, sports drinks, and soda.  Avoid alcohol.  Avoid spicy or fatty foods.  General instructions  Drink enough fluid to keep your urine clear or pale yellow.  Wash your hands often. If soap and water are not available, use hand sanitizer.  Make sure that all people in your household wash their hands well and often.  Take over-the-counter and prescription medicines only as told by your health care provider.  Rest at home while you recover.  Watch your condition for any changes.  Take a warm bath to relieve any burning or pain from frequent diarrhea episodes.  Keep all follow-up visits as told by your health care provider. This is important. Contact a health care provider if:  You have a fever.  Your diarrhea gets worse.  You have new symptoms.  You cannot keep fluids down.  You feel light-headed or dizzy.  You have a headache  You have muscle cramps. Get help right away if:  You have chest  pain.  You feel extremely weak or you faint.  You have bloody or black stools or stools that look like tar.  You have severe pain, cramping, or bloating in your abdomen.  You have trouble breathing or you are breathing very quickly.  Your heart is beating very quickly.  Your skin feels cold and clammy.  You feel confused.  You have signs of dehydration, such as: ? Dark urine, very little urine, or no urine. ? Cracked lips. ? Dry mouth. ? Sunken eyes. ? Sleepiness. ? Weakness. This information is not intended to replace advice given to you by your health care provider. Make sure you discuss any questions you have with your health care provider. Document Released: 12/04/2002 Document Revised: 04/23/2016 Document Reviewed: 08/20/2015 Elsevier Interactive Patient Education  2018 Elsevier Inc.  

## 2018-11-29 NOTE — Telephone Encounter (Signed)
Patient scheduled for Thursday at 7:45.

## 2018-11-29 NOTE — Progress Notes (Signed)
Subjective:  Patient ID: Angel Combs, male    DOB: [birth year]  Age: 52 y.o. MRN: [id number]  CC: Hypertension and Hyperlipidemia   HPI Angel Combs presents for f/up - He remains concerned about his intermittently elevated triglycerides.  After further discussion, he thinks it may be related to drinking beer the day before he has his triglycerides tested.  He continues to take the omega-3 fish oil and tolerates it well.  He is very active, he runs with his dogs, and denies DOE/CP/palpitations/edema/fatigue.  Has a low ASCVD risk score but he is concerned about a family history of CAD and wants to take a statin for CV risk reduction.  He has mild, intermittent episodes of diarrhea and wants to be tested for celiac disease.  Outpatient Medications Prior to Visit  Medication Sig Dispense Refill  . citalopram (CELEXA) 10 MG tablet Take 1 tablet (10 mg total) by mouth daily. (Patient taking differently: Take 10 mg by mouth daily. Taking every other day) 90 tablet 3  . omega-3 acid ethyl esters (LOVAZA) 1 g capsule Take 2 capsules (2 g total) by mouth 2 (two) times daily. 360 capsule 1  . fluticasone (FLONASE) 50 MCG/ACT nasal spray Place 2 sprays into both nostrils daily. 16 g 2   No facility-administered medications prior to visit.     ROS Review of Systems  Constitutional: Negative.  Negative for appetite change, diaphoresis, fatigue and unexpected weight change.  HENT: Positive for congestion, postnasal drip and rhinorrhea. Negative for facial swelling, nosebleeds, sinus pressure, sinus pain, sore throat and trouble swallowing.   Eyes: Negative for visual disturbance.  Respiratory: Negative for cough, chest tightness, shortness of breath and wheezing.   Cardiovascular: Negative for chest pain, palpitations and leg swelling.  Gastrointestinal: Positive for diarrhea. Negative for abdominal pain, nausea and vomiting.  Genitourinary: Negative.  Negative for difficulty urinating.    Musculoskeletal: Negative.  Negative for arthralgias and myalgias.  Skin: Negative.   Neurological: Negative.  Negative for dizziness, weakness and light-headedness.  Hematological: Negative for adenopathy. Does not bruise/bleed easily.  Psychiatric/Behavioral: Negative.     Objective:  BP 124/82 (BP Location: Left Arm, Patient Position: Sitting, Cuff Size: Normal)   Pulse 66   Temp (!) 97.3 F (36.3 C) (Oral)   Resp 16   Ht 5\' 11"  (1.803 m)   Wt 177 lb 4 oz (80.4 kg)   SpO2 99%   BMI 24.72 kg/m   BP Readings from Last 3 Encounters:  11/29/18 124/82  11/18/18 120/80  11/17/18 140/80    Wt Readings from Last 3 Encounters:  11/29/18 177 lb 4 oz (80.4 kg)  11/18/18 175 lb (79.4 kg)  11/17/18 173 lb (78.5 kg)    Physical Exam  Constitutional: He is oriented to person, place, and time. No distress.  HENT:  Nose: Mucosal edema and rhinorrhea present. No epistaxis. Right sinus exhibits no maxillary sinus tenderness and no frontal sinus tenderness. Left sinus exhibits no maxillary sinus tenderness and no frontal sinus tenderness.  Mouth/Throat: Oropharynx is clear and moist. No oropharyngeal exudate.  Eyes: Conjunctivae are normal. No scleral icterus.  Neck: Normal range of motion. Neck supple. No JVD present. No thyromegaly present.  Cardiovascular: Normal rate, regular rhythm and normal heart sounds.  No murmur heard. Pulmonary/Chest: Effort normal and breath sounds normal. No respiratory distress. He has no wheezes. He has no rhonchi. He has no rales.  Abdominal: Soft. Bowel sounds are normal. He exhibits no mass. There is no hepatosplenomegaly. There is  no tenderness.  Musculoskeletal: Normal range of motion. He exhibits no edema, tenderness or deformity.  Lymphadenopathy:    He has no cervical adenopathy.  Neurological: He is alert and oriented to person, place, and time.  Skin: Skin is warm and dry. No rash noted. He is not diaphoretic.  Vitals reviewed.   Lab  Results  Component Value Date   WBC 7.1 11/29/2018   HGB 14.0 11/29/2018   HCT 40.5 11/29/2018   PLT 271.0 11/29/2018   GLUCOSE 84 11/17/2018   CHOL 260 (H) 11/17/2018   TRIG 267.0 (H) 11/17/2018   HDL 58.20 11/17/2018   LDLDIRECT 80.0 11/17/2018   LDLCALC 180 (H) 05/25/2018   ALT 18 05/16/2018   AST 17 05/16/2018   NA 137 11/17/2018   K 3.9 11/17/2018   CL 101 11/17/2018   CREATININE 1.37 11/17/2018   BUN 13 11/17/2018   CO2 26 11/17/2018   TSH 2.27 05/16/2018   PSA 0.25 05/16/2018    Dg Shoulder Left  Result Date: 04/13/2016 CLINICAL DATA:  Chronic left shoulder pain, worsening. No known injury. EXAM: LEFT SHOULDER - 2+ VIEW COMPARISON:  None. FINDINGS: There is no evidence of fracture or dislocation. There is no evidence of arthropathy or other focal bone abnormality. Soft tissues are unremarkable. IMPRESSION: Negative. Electronically Signed   By: Charlett NoseKevin  Dover M.D.   On: 04/13/2016 16:46    Assessment & Plan:   Angel Combs was seen today for hypertension and hyperlipidemia.  Diagnoses and all orders for this visit:  Essential hypertension- His blood pressure is well controlled with no medications.  Elevated cholesterol with elevated triglycerides- He take a statin for CV risk reduction. -     atorvastatin (LIPITOR) 10 MG tablet; Take 1 tablet (10 mg total) by mouth daily.  Seasonal allergic rhinitis due to pollen- I have asked him to treat this with an oral antihistamine and inhaled nasal steroids. -     fluticasone (FLONASE) 50 MCG/ACT nasal spray; Place 2 sprays into both nostrils daily. -     levocetirizine (XYZAL) 5 MG tablet; Take 1 tablet (5 mg total) by mouth every evening.  Diarrhea, unspecified type- His CBC is normal.  I will screen him for celiac disease. -     CBC with Differential/Platelet; Future -     Gliadin antibodies, serum; Future -     Tissue transglutaminase, IgA; Future -     Reticulin Antibody, IgA w reflex titer; Future   I am having Angel Maudlinhomas  Combs start on atorvastatin and levocetirizine. I am also having him maintain his citalopram, omega-3 acid ethyl esters, and fluticasone.  Meds ordered this encounter  Medications  . atorvastatin (LIPITOR) 10 MG tablet    Sig: Take 1 tablet (10 mg total) by mouth daily.    Dispense:  90 tablet    Refill:  1  . fluticasone (FLONASE) 50 MCG/ACT nasal spray    Sig: Place 2 sprays into both nostrils daily.    Dispense:  48 g    Refill:  1  . levocetirizine (XYZAL) 5 MG tablet    Sig: Take 1 tablet (5 mg total) by mouth every evening.    Dispense:  90 tablet    Refill:  1     Follow-up: Return if symptoms worsen or fail to improve.  Angel Combs Thresa Dozier, MD

## 2018-12-01 ENCOUNTER — Ambulatory Visit: Admitting: Family Medicine

## 2018-12-01 ENCOUNTER — Ambulatory Visit (INDEPENDENT_AMBULATORY_CARE_PROVIDER_SITE_OTHER): Admitting: Family Medicine

## 2018-12-01 ENCOUNTER — Ambulatory Visit: Payer: Self-pay

## 2018-12-01 VITALS — Ht 71.0 in

## 2018-12-01 DIAGNOSIS — S46012D Strain of muscle(s) and tendon(s) of the rotator cuff of left shoulder, subsequent encounter: Secondary | ICD-10-CM

## 2018-12-01 DIAGNOSIS — G8929 Other chronic pain: Secondary | ICD-10-CM

## 2018-12-01 DIAGNOSIS — M25512 Pain in left shoulder: Principal | ICD-10-CM

## 2018-12-01 NOTE — Progress Notes (Signed)
  Tawana ScaleZach Smith D.O. [facility] [address] [place], [place] [address] Phone: [phone] Subjective:   [name], Valerie Wolf, am serving as a scribe for Dr. Antoine PrimasZachary Smith.    CC: Left shoulder pain follow-up  NGE:[id number]:Subjective   Left shoulder pain.  Patient has been seen previously.  Was diagnosed with a rotator cuff tear on ultrasound as well as on MRI.  Patient wants to hold and avoid too much activities at this point.  Patient is going to do PRP today   Procedure: Real-time Ultrasound Guided Injection of left glenohumeral joint Device: GE Logiq E  Ultrasound guided injection is preferred based studies that show increased duration, increased effect, greater accuracy, decreased procedural pain, increased response rate with ultrasound guided versus blind injection.  Verbal informed consent obtained.  Time-out conducted.  Noted no overlying erythema, induration, or other signs of local infection.  Skin prepped in a sterile fashion.  Local anesthesia: Topical Ethyl chloride.  With sterile technique and under real time ultrasound guidance:  Joint visualized.  21g 2 inch needle inserted anterior lateral approach. Pictures taken for needle placement. Patient did have injection of 2 cc of 0.5% Marcaine, then injected 4 cc of pre-centrifuge PRP into the area of the tear superficial of the supraspinatus tendon pain immediately resolved suggesting accurate placement of the medication.  Advised to call if fevers/chills, erythema, induration, drainage, or persistent bleeding.  Images permanently stored and available for review in the ultrasound unit.  Impression: Technically successful ultrasound guided injection.

## 2018-12-01 NOTE — Assessment & Plan Note (Signed)
PRP injected.  Discussed icing regimen and home exercises.  Encouraged patient to continue all other conservative therapy.  Follow-up with me again in 4 to 8 weeks

## 2018-12-01 NOTE — Patient Instructions (Signed)
You know the drill  Happy holidays!  See me again in 3 weeks

## 2018-12-04 LAB — GLIADIN ANTIBODIES, SERUM
GLIADIN IGG: 10 U
Gliadin IgA: 30 Units — ABNORMAL HIGH

## 2018-12-04 LAB — TISSUE TRANSGLUTAMINASE, IGA: (TTG) AB, IGA: 1 U/mL

## 2018-12-04 LAB — RETICULIN ANTIBODIES, IGA W TITER: Reticulin IGA Screen: NEGATIVE

## 2018-12-05 ENCOUNTER — Encounter: Payer: Self-pay | Admitting: Internal Medicine

## 2018-12-16 ENCOUNTER — Ambulatory Visit: Admitting: Family Medicine

## 2018-12-26 ENCOUNTER — Ambulatory Visit: Admitting: Family Medicine

## 2019-01-09 NOTE — Progress Notes (Signed)
Tawana ScaleZach  D.O. [facility] [address] [place], [place] [address] Phone: [phone] Subjective:   [name], Valerie Wolf, am serving as a scribe for Dr. Antoine PrimasZachary .   CC: Shoulder pain follow-up  UVO:[id number]:Subjective  Angel Combs is a 53 y.o. male coming in with complaint of left shoulder pain. Has been able to get back into the gym. Feels a "rubber band" like sensation in the anterior shoulder. Is going light in the gym. Does feel that the PRP is working.  Patient states that he is feeling like he is making some improvement with strength as well as range of motion.  Pain overall is 1 out of 10     Past Medical History:  Diagnosis Date  . ALLERGIC RHINITIS CAUSE UNSPECIFIED   . Anxiety    using celxa.  [name]. LACTOSE INTOLERANCE   . Left shoulder pain July '13   had tried accupuncture.   . Pain of left heel   . PATELLO-FEMORAL SYNDROME    sees chiropractor with good results using exercises.   Past Surgical History:  Procedure Laterality Date  . lasik  Augu 22, 2013  . NOSE SURGERY  1991   Social History   Socioeconomic History  . Marital status: Single    Spouse name: Not on file  . Number of children: 0  . Years of education: 7424  . Highest education level: Not on file  Occupational History  . Occupation: Real Chief Technology Officerestate developer  Social Needs  . Financial resource strain: Not on file  . Food insecurity:    Worry: Not on file    Inability: Not on file  . Transportation needs:    Medical: Not on file    Non-medical: Not on file  Tobacco Use  . Smoking status: Never Smoker  . Smokeless tobacco: Never Used  Substance and Sexual Activity  . Alcohol use: Yes    Alcohol/week: 15.0 standard drinks    Types: 15 drink(s) per week  . Drug use: No  . Sexual activity: Yes    Partners: Female  Lifestyle  . Physical activity:    Days per week: Not on file    Minutes per session: Not on file  . Stress: Not on file  Relationships  . Social connections:    Talks on  phone: Not on file    Gets together: Not on file    Attends religious service: Not on file    Active member of club or organization: Not on file    Attends meetings of clubs or organizations: Not on file    Relationship status: Not on file  Other Topics Concern  . Not on file  Social History Narrative   HSG, [employer]/ROTC. 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 No regular exercise program.   No Known Allergies Family History  Problem Relation Age of Onset  . Hyperlipidemia Father   . Hypertension Father   . Coronary artery disease Father   . Diabetes Maternal Grandmother   . Coronary artery disease Other   . Arthritis Mother        back pain  . Cancer Neg Hx   . Depression Neg Hx   . Early death Neg Hx   . Hearing loss Neg Hx   . Heart disease Neg Hx   . Kidney disease Neg Hx   .  Stroke Neg Hx      Current Outpatient Medications (Cardiovascular):  .  atorvastatin (LIPITOR) 10 MG tablet, Take 1 tablet (10 mg total) by mouth daily. [name].  omega-3 acid ethyl esters (LOVAZA) 1 g capsule, Take 2 capsules (2 g total) by mouth 2 (two) times daily.  Current Outpatient Medications (Respiratory):  .  fluticasone (FLONASE) 50 MCG/ACT nasal spray, Place 2 sprays into both nostrils daily. [name].  levocetirizine (XYZAL) 5 MG tablet, Take 1 tablet (5 mg total) by mouth every evening.    Current Outpatient Medications (Other):  .  citalopram (CELEXA) 10 MG tablet, Take 1 tablet (10 mg total) by mouth daily. (Patient taking differently: Take 10 mg by mouth daily. Taking every other day)    Past medical history, social, surgical and family history all reviewed in electronic medical record.  No pertanent information unless stated regarding to the chief complaint.   Review of Systems:  No headache, visual changes, nausea, vomiting,  diarrhea, constipation, dizziness, abdominal pain, skin rash, fevers, chills, night sweats, weight loss, swollen lymph nodes, body aches, joint swelling,, chest pain, shortness of breath, mood changes.  Positive muscle aches  Objective  Blood pressure 138/80, pulse 73, height 5\' 11"  (1.803 m), weight 179 lb (81.2 kg), SpO2 98 %.    General: No apparent distress alert and oriented x3 mood and affect normal, dressed appropriately.  HEENT: Pupils equal, extraocular movements intact  Respiratory: Patient's speak in full sentences and does not appear short of breath  Cardiovascular: No lower extremity edema, non tender, no erythema  Skin: Warm dry intact with no signs of infection or rash on extremities or on axial skeleton.  Abdomen: Soft nontender  Neuro: Cranial nerves II through XII are intact, neurovascularly intact in all extremities with 2+ DTRs and 2+ pulses.  Lymph: No lymphadenopathy of posterior or anterior cervical chain or axillae bilaterally.  Gait normal with good balance and coordination.  MSK:  Non tender with full range of motion and good stability and symmetric strength and tone of  elbows, wrist, hip, knee and ankles bilaterally.  Shoulder: Left Inspection reveals no abnormalities, atrophy or asymmetry. Palpation is normal with no tenderness over AC joint or bicipital groove. ROM is full in all planes. Rotator cuff strength 4 out of 5 compared to the contralateral side but mild improvement Positive impingement Speeds and Yergason's tests normal. No labral pathology noted with negative Obrien's, negative clunk and good stability. Normal scapular function observed. No painful arc and no drop arm sign. No apprehension sign Contralateral shoulder unremarkable  MSK US performed of: Left shoulder This study was ordered, performed, and interpreted by Terrilee FilesZach  D.O.  Shoulder:   Supraspinatus: There is still appreciated.  Patient does have some what appears to be scar tissue  formation and possible some mild do tissue formation.  Still areas of high-grade tearing noted with some mild retraction. Subscapularis:  Appears normal on long and transverse views. Biceps Tendon:  Appears normal on long and transverse views, no fraying of tendon, tendon located in intertubercular groove, no subluxation with shoulder internal or external rotation. No increased power doppler signal. Impression: Rotator cuff tear with possible slow amount of healing noted   Impression and Recommendations:     This case required medical decision making of moderate complexity. The above documentation has been reviewed and is accurate and complete Judi SaaZachary M , DO       Note: This dictation was prepared with Dragon dictation along with smaller phrase technology. Any  transcriptional errors that result from this process are unintentional.

## 2019-01-10 ENCOUNTER — Ambulatory Visit (INDEPENDENT_AMBULATORY_CARE_PROVIDER_SITE_OTHER): Admitting: Family Medicine

## 2019-01-10 ENCOUNTER — Encounter: Payer: Self-pay | Admitting: Family Medicine

## 2019-01-10 ENCOUNTER — Ambulatory Visit: Payer: Self-pay

## 2019-01-10 VITALS — BP 138/80 | HR 73 | Ht 71.0 in | Wt 179.0 lb

## 2019-01-10 DIAGNOSIS — G8929 Other chronic pain: Secondary | ICD-10-CM

## 2019-01-10 DIAGNOSIS — M25512 Pain in left shoulder: Secondary | ICD-10-CM

## 2019-01-10 DIAGNOSIS — S46012D Strain of muscle(s) and tendon(s) of the rotator cuff of left shoulder, subsequent encounter: Secondary | ICD-10-CM

## 2019-01-10 NOTE — Assessment & Plan Note (Signed)
Patient is making some progress.  Discussed with patient again at great length.  I do not feel that a repeat PRP would be beneficial at this time.  Patient is approximately 4 weeks out from the injection will monitor.  Continuing to have pain or any worsening weakness I do think surgical intervention could be best.  I am hoping that the patient will make significant strides and over the next 3 to 4 weeks of the conservative therapy and we will ultrasound again in follow-up.

## 2019-01-10 NOTE — Patient Instructions (Signed)
Please go slow  Ice is your friend  Looks good overall Increase bench 10# a week.  Keep hands within peripheral vision  With the running try Thigh compression sleeve  With running Decrease stride length as well focus on hitting ball of foot more  See me again in 4ish weeks to make sure doing well

## 2019-01-19 ENCOUNTER — Other Ambulatory Visit: Payer: Self-pay | Admitting: Internal Medicine

## 2019-01-19 DIAGNOSIS — F32A Depression, unspecified: Secondary | ICD-10-CM

## 2019-01-19 DIAGNOSIS — F329 Major depressive disorder, single episode, unspecified: Secondary | ICD-10-CM

## 2019-01-19 DIAGNOSIS — E781 Pure hyperglyceridemia: Secondary | ICD-10-CM

## 2019-02-13 NOTE — Progress Notes (Signed)
[facility] [address] [place], [place] [address] Phone: [phone] Subjective:    I Angel Combs am serving as a Neurosurgeon for Dr. Antoine Primas.    I'm seeing this patient by the request  of:    CC: Left shoulder pain follow-up  [id number]    01/10/2019 Patient is making some progress.  Discussed with patient again at great length.  I do not feel that a repeat PRP would be beneficial at this time.  Patient is approximately 4 weeks out from the injection will monitor.  Continuing to have pain or any worsening weakness I do think surgical intervention could be best.  I am hoping that the patient will make significant strides and over the next 3 to 4 weeks of the conservative therapy and we will ultrasound again in follow-up.   Updated 02/14/2019 Angel Combs is a 53 y.o. male coming in with complaint of left shoulder pain. States that he has pain occassionally. Has been exercising.  Patient has been increasing weight again.  Patient states that he has been changed out 225 pounds.  Feels like it is doing relatively well.  Wound put himself at about 80% better.      Past Medical History:  Diagnosis Date  . ALLERGIC RHINITIS CAUSE UNSPECIFIED   . Anxiety    using celxa.  [name] LACTOSE INTOLERANCE   . Left shoulder pain July '13   had tried accupuncture.   . Pain of left heel   . PATELLO-FEMORAL SYNDROME    sees chiropractor with good results using exercises.   Past Surgical History:  Procedure Laterality Date  . lasik  Augu 22, 2013  . NOSE SURGERY  1991   Social History   Socioeconomic History  . Marital status: Single    Spouse name: Not on file  . Number of children: 0  . Years of education: 53  . Highest education level: Not on file  Occupational History  . Occupation: Real Chief Technology Officer  Social Needs  . Financial resource strain: Not on file  . Food insecurity:    Worry: Not on file    Inability: Not on file  . Transportation  needs:    Medical: Not on file    Non-medical: Not on file  Tobacco Use  . Smoking status: Never Smoker  . Smokeless tobacco: Never Used  Substance and Sexual Activity  . Alcohol use: Yes    Alcohol/week: 15.0 standard drinks    Types: 15 drink(s) per week  . Drug use: No  . Sexual activity: Yes    Partners: Female  Lifestyle  . Physical activity:    Days per week: Not on file    Minutes per session: Not on file  . Stress: Not on file  Relationships  . Social connections:    Talks on phone: Not on file    Gets together: Not on file    Attends religious service: Not on file    Active member of club or organization: Not on file    Attends meetings of clubs or organizations: Not on file    Relationship status: Not on file  Other Topics Concern  . Not on file  Social History Narrative   HSG, [employer]/ROTC. 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 No regular exercise program.   No  Known Allergies Family History  Problem Relation Age of Onset  . Hyperlipidemia Father   . Hypertension Father   . Coronary artery disease Father   . Diabetes Maternal Grandmother   . Coronary artery disease Other   . Arthritis Mother        back pain  . Cancer Neg Hx   . Depression Neg Hx   . Early death Neg Hx   . Hearing loss Neg Hx   . Heart disease Neg Hx   . Kidney disease Neg Hx   . Stroke Neg Hx      Current Outpatient Medications (Cardiovascular):  .  atorvastatin (LIPITOR) 10 MG tablet, Take 1 tablet (10 mg total) by mouth daily. [name]  omega-3 acid ethyl esters (LOVAZA) 1 g capsule, Take 2 capsules (2 g total) by mouth 2 (two) times daily.  Current Outpatient Medications (Respiratory):  .  fluticasone (FLONASE) 50 MCG/ACT nasal spray, Place 2 sprays into both nostrils daily. [name]  levocetirizine (XYZAL) 5 MG tablet, Take 1 tablet  (5 mg total) by mouth every evening.    Current Outpatient Medications (Other):  .  citalopram (CELEXA) 10 MG tablet, Take 1 tablet (10 mg total) by mouth daily.    Past medical history, social, surgical and family history all reviewed in electronic medical record.  No pertanent information unless stated regarding to the chief complaint.   Review of Systems:  No headache, visual changes, nausea, vomiting, diarrhea, constipation, dizziness, abdominal pain, skin rash, fevers, chills, night sweats, weight loss, swollen lymph nodes, body aches, joint swelling,  chest pain, shortness of breath, mood changes.  Positive muscle aches  Objective  Blood pressure 120/60, pulse 61, height 5\' 11"  (1.803 m), weight 179 lb (81.2 kg), SpO2 98 %.   General: No apparent distress alert and oriented x3 mood and affect normal, dressed appropriately.  HEENT: Pupils equal, extraocular movements intact  Respiratory: Patient's speak in full sentences and does not appear short of breath  Cardiovascular: No lower extremity edema, non tender, no erythema  Skin: Warm dry intact with no signs of infection or rash on extremities or on axial skeleton.  Abdomen: Soft nontender  Neuro: Cranial nerves II through XII are intact, neurovascularly intact in all extremities with 2+ DTRs and 2+ pulses.  Lymph: No lymphadenopathy of posterior or anterior cervical chain or axillae bilaterally.  Gait normal with good balance and coordination.  MSK:  Non tender with full range of motion and good stability and symmetric strength and tone of  elbows, wrist, hip, knee and ankles bilaterally.   Shoulder: Left Inspection reveals no abnormalities, atrophy or asymmetry. 4-5 strength compared to the contralateral side Positive impingement Speeds and Yergason's tests normal. No labral pathology noted with negative Obrien's, negative clunk and good stability. Normal scapular function observed. No painful arc and no drop arm sign. No  apprehension sign Contralateral shoulder unremarkable  MSK US performed of: Left shoulder This study was ordered, performed, and interpreted by Terrilee Files D.O.  Shoulder:   Supraspinatus: Mild intrasubstance tearing but no significant retraction.  Improved from previous exam Infraspinatus: Atrophy noted Subscapularis:  Appears normal on long and transverse views. AC joint: Tender changes Glenohumeral Joint:  Appears normal without effusion. Glenoid Labrum:  Intact without visualized tears. Biceps Tendon: Mild degenerative changes noted  Impression: Rotator cuff tear still noted but improvement in retraction    Impression and Recommendations:      The above documentation has been reviewed and is accurate and complete Angel Combs  Koren Bound, DO       Note: This dictation was prepared with Dragon dictation along with smaller phrase technology. Any transcriptional errors that result from this process are unintentional.

## 2019-02-14 ENCOUNTER — Encounter: Payer: Self-pay | Admitting: Family Medicine

## 2019-02-14 ENCOUNTER — Ambulatory Visit: Payer: Self-pay

## 2019-02-14 ENCOUNTER — Ambulatory Visit (INDEPENDENT_AMBULATORY_CARE_PROVIDER_SITE_OTHER): Admitting: Family Medicine

## 2019-02-14 VITALS — BP 120/60 | HR 61 | Ht 71.0 in | Wt 179.0 lb

## 2019-02-14 DIAGNOSIS — S46012D Strain of muscle(s) and tendon(s) of the rotator cuff of left shoulder, subsequent encounter: Secondary | ICD-10-CM

## 2019-02-14 DIAGNOSIS — G8929 Other chronic pain: Secondary | ICD-10-CM

## 2019-02-14 DIAGNOSIS — M25512 Pain in left shoulder: Principal | ICD-10-CM

## 2019-02-14 NOTE — Patient Instructions (Signed)
Good to see yo You are doing better then anticipated Keep it up  Keep working back exercises Be careful adding too much weight Keep hands within peripheral vision  See me again in 6-8 weeks

## 2019-02-14 NOTE — Assessment & Plan Note (Signed)
Patient is making some improvement.  Is now greater than 2 months out from the previous injection.  I believe the patient is making significant progress.  Patient has made some strength.  Wants to continue with conservative therapy.  I do not see any worsening of the retraction so I do believe the patient has time to do this.  Follow-up with me again in 6 to 12 weeks

## 2019-03-30 ENCOUNTER — Ambulatory Visit: Admitting: Family Medicine

## 2019-05-02 ENCOUNTER — Other Ambulatory Visit: Payer: Self-pay

## 2019-05-02 ENCOUNTER — Ambulatory Visit: Payer: Self-pay

## 2019-05-02 ENCOUNTER — Ambulatory Visit (INDEPENDENT_AMBULATORY_CARE_PROVIDER_SITE_OTHER): Admitting: Family Medicine

## 2019-05-02 VITALS — BP 116/82 | HR 59 | Ht 71.0 in | Wt 178.0 lb

## 2019-05-02 DIAGNOSIS — S46012D Strain of muscle(s) and tendon(s) of the rotator cuff of left shoulder, subsequent encounter: Secondary | ICD-10-CM

## 2019-05-02 DIAGNOSIS — G8929 Other chronic pain: Secondary | ICD-10-CM | POA: Diagnosis not present

## 2019-05-02 DIAGNOSIS — M25512 Pain in left shoulder: Principal | ICD-10-CM

## 2019-05-02 DIAGNOSIS — M79A21 Nontraumatic compartment syndrome of right lower extremity: Secondary | ICD-10-CM

## 2019-05-02 DIAGNOSIS — M79A22 Nontraumatic compartment syndrome of left lower extremity: Secondary | ICD-10-CM

## 2019-05-02 MED ORDER — VITAMIN D (ERGOCALCIFEROL) 1.25 MG (50000 UNIT) PO CAPS: 50000.0000 [IU] | ORAL_CAPSULE | ORAL | 0 refills | Status: DC

## 2019-05-02 NOTE — Patient Instructions (Addendum)
Good to see you  Ice is your friend Once weekly vitamin D for 12 weeks.  Compression socks with working out.  Spenco orthotics "total support" online would be great  HOKA arahi 3 could be a good running shoe for you  See me again in 6ish weeks

## 2019-05-02 NOTE — Assessment & Plan Note (Signed)
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  Seems to be the lateral compartment.  Discussed compression socks, over-the-counter orthotics and proper shoes.  Discussed exercises and work with Event organiser.  Follow-up again 6 weeks

## 2019-05-02 NOTE — Assessment & Plan Note (Signed)
Injected reactive bursitis.  Discussed HEP  Discussed which activities  RTC in 6 weeks

## 2019-05-02 NOTE — Progress Notes (Signed)
[facility] [address] [place], [place] [address] Phone: [phone] Subjective:   Angel Combs, am serving as a scribe for Dr. Antoine Combs.   CC: Left shoulder bilateral leg pain  BJY:[id number]   02/14/2019: Patient is making some improvement.  Is now greater than 2 months out from the previous injection.  I believe the patient is making significant progress.  Patient has made some strength.  Wants to continue with conservative therapy.  I do not see any worsening of the retraction so I do believe the patient has time to do this.  Follow-up with me again in 6 to 12 weeks  Update 05/02/2019: Angel Combs is a 53 y.o. male coming in with complaint of shoulder pain. Patient states that his shoulder is fine as he has not been using it. Can develop pain with sleeping on the left side.  Patient did have a full-thickness rotator cuff well with PRP.  Has not noticed any weakness at the moment.  Still audible popping sensation  Patient has been running 3 miles a day and states that pain occurs at the beginning of his runs. Pain subsides the longer he runs. Has increased running frequency due to Covid.  Patient describes the pain on the lateral aspect of the legs.  Seems worse after longer movements stop him from activity to slow down     Past Medical History:  Diagnosis Date  . ALLERGIC RHINITIS CAUSE UNSPECIFIED   . Anxiety    using celxa.  [name] LACTOSE INTOLERANCE   . Left shoulder pain July '13   had tried accupuncture.   . Pain of left heel   . PATELLO-FEMORAL SYNDROME    sees chiropractor with good results using exercises.   Past Surgical History:  Procedure Laterality Date  . lasik  Augu 22, 2013  . NOSE SURGERY  1991   Social History   Socioeconomic History  . Marital status: Single    Spouse name: Not on file  . Number of children: 0  . Years of education: 70  . Highest education level: Not on file  Occupational History  . Occupation: Real  Chief Technology Officer  Social Needs  . Financial resource strain: Not on file  . Food insecurity:    Worry: Not on file    Inability: Not on file  . Transportation needs:    Medical: Not on file    Non-medical: Not on file  Tobacco Use  . Smoking status: Never Smoker  . Smokeless tobacco: Never Used  Substance and Sexual Activity  . Alcohol use: Yes    Alcohol/week: 15.0 standard drinks    Types: 15 drink(s) per week  . Drug use: No  . Sexual activity: Yes    Partners: Female  Lifestyle  . Physical activity:    Days per week: Not on file    Minutes per session: Not on file  . Stress: Not on file  Relationships  . Social connections:    Talks on phone: Not on file    Gets together: Not on file    Attends religious service: Not on file    Active member of club or organization: Not on file    Attends meetings of clubs or organizations: Not on file    Relationship status: Not on file  Other Topics Concern  . Not on file  Social History Narrative   HS[employer], [employer]/ROTC. BS, MBA, MA-geography, MA foreign affairs  [place], [place]- MIS. 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 Single. 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 On the road a lot. No regular exercise program.   No Known Allergies Family History  Problem Relation Age of Onset  . Hyperlipidemia Father   . Hypertension Father   . Coronary artery disease Father   . Diabetes Maternal Grandmother   . Coronary artery disease Other   . Arthritis Mother        back pain  . Cancer Neg Hx   . Depression Neg Hx   . Early death Neg Hx   . Hearing loss Neg Hx   . Heart disease Neg Hx   . Kidney disease Neg Hx   . Stroke Neg Hx      Current Outpatient Medications (Cardiovascular):  .  atorvastatin (LIPITOR) 10 MG tablet, Take 1 tablet (10 mg total) by mouth daily. [name]  omega-3 acid ethyl esters (LOVAZA) 1 g capsule, Take 2 capsules (2 g total) by mouth 2 (two) times daily.  Current Outpatient  Medications (Respiratory):  .  fluticasone (FLONASE) 50 MCG/ACT nasal spray, Place 2 sprays into both nostrils daily. [name]  levocetirizine (XYZAL) 5 MG tablet, Take 1 tablet (5 mg total) by mouth every evening.    Current Outpatient Medications (Other):  .  citalopram (CELEXA) 10 MG tablet, Take 1 tablet (10 mg total) by mouth daily.    Past medical history, social, surgical and family history all reviewed in electronic medical record.  No pertanent information unless stated regarding to the chief complaint.   Review of Systems:  No headache, visual changes, nausea, vomiting, diarrhea, constipation, dizziness, abdominal pain, skin rash, fevers, chills, night sweats, weight loss, swollen lymph nodes, body aches, joint swelling, muscle aches, chest pain, shortness of breath, mood changes.   Objective  There were no vitals taken for this visit. Systems examined below as of    General: No apparent distress alert and oriented x3 mood and affect normal, dressed appropriately.  HEENT: Pupils equal, extraocular movements intact  Respiratory: Patient's speak in full sentences and does not appear short of breath  Cardiovascular: No lower extremity edema, non tender, no erythema  Skin: Warm dry intact with no signs of infection or rash on extremities or on axial skeleton.  Abdomen: Soft nontender  Neuro: Cranial nerves II through XII are intact, neurovascularly intact in all extremities with 2+ DTRs and 2+ pulses.  Lymph: No lymphadenopathy of posterior or anterior cervical chain or axillae bilaterally.  Gait normal with good balance and coordination.  MSK:  Non tender with full range of motion and good stability and symmetric strength and tone of  elbows, wrist, hip, knee and ankles bilaterally.  Shoulder: left Inspection reveals no abnormalities, atrophy or asymmetry. Palpation is normal with no tenderness over AC joint or bicipital groove. ROM is full in all planes passively. Rotator cuff  strength normal throughout. signs of impingement with positive Neer and Hawkin's tests, but negative empty can sign. Speeds and Yergason's tests normal. No labral pathology noted with negative Obrien's, negative clunk and good stability. Normal scapular function observed. No painful arc and no drop arm sign. No apprehension sign Bilateral lower extremity shows the patient does have tenderness to palpation more on the lateral joint line of the knees.  Otherwise unremarkable.  Ankles have mild supination bilaterally.  MSK US performed of: left This study was ordered, performed, and interpreted by Terrilee Files D.O.  Shoulder:   Supraspinatus:  Appears normal on long and  transverse views, Bursal bulge seen with shoulder abduction on impingement view. Infraspinatus:  Appears normal on long and transverse views. Significant increase in Doppler flow Subscapularis:  Appears normal on long and transverse views. Positive bursa Teres Minor:  Appears normal on long and transverse views. AC joint:  Capsule undistended, no geyser sign. Glenohumeral Joint:  Appears normal without effusion. Glenoid Labrum:  Intact without visualized tears. Biceps Tendon:  Appears normal on long and transverse views, no fraying of tendon, tendon located in intertubercular groove, no subluxation with shoulder internal or external rotation.  Impression: Subacromial bursitis  Procedure: Real-time Ultrasound Guided Injection of left glenohumeral joint Device: GE Logiq E  Ultrasound guided injection is preferred based studies that show increased duration, increased effect, greater accuracy, decreased procedural pain, increased response rate with ultrasound guided versus blind injection.  Verbal informed consent obtained.  Time-out conducted.  Noted no overlying erythema, induration, or other signs of local infection.  Skin prepped in a sterile fashion.  Local anesthesia: Topical Ethyl chloride.  With sterile technique and  under real time ultrasound guidance:  Joint visualized.  23g 1  inch needle inserted posterior approach. Pictures taken for needle placement. Patient did have injection of 2 cc of 1% lidocaine, 2 cc of 0.5% Marcaine, and 1.0 cc of Kenalog 40 mg/dL. Completed without difficulty  Pain immediately resolved suggesting accurate placement of the medication.  Advised to call if fevers/chills, erythema, induration, drainage, or persistent bleeding.  Images permanently stored and available for review in the ultrasound unit.  Impression: Technically successful ultrasound guided injection.    Impression and Recommendations:     This case required medical decision making of moderate complexity. The above documentation has been reviewed and is accurate and complete Judi SaaZachary M Tahjae Clausing, DO       Note: This dictation was prepared with Dragon dictation along with smaller phrase technology. Any transcriptional errors that result from this process are unintentional.

## 2019-05-15 ENCOUNTER — Encounter: Payer: Self-pay | Admitting: Internal Medicine

## 2019-05-16 ENCOUNTER — Encounter: Payer: Self-pay | Admitting: Internal Medicine

## 2019-05-16 ENCOUNTER — Ambulatory Visit (INDEPENDENT_AMBULATORY_CARE_PROVIDER_SITE_OTHER): Admitting: Internal Medicine

## 2019-05-16 ENCOUNTER — Other Ambulatory Visit: Payer: Self-pay

## 2019-05-16 ENCOUNTER — Other Ambulatory Visit

## 2019-05-16 VITALS — BP 120/70 | HR 64 | Temp 98.2°F | Resp 16

## 2019-05-16 DIAGNOSIS — L03011 Cellulitis of right finger: Secondary | ICD-10-CM | POA: Diagnosis not present

## 2019-05-16 MED ORDER — HYDROCODONE-ACETAMINOPHEN 5-325 MG PO TABS: 1.0000 | ORAL_TABLET | ORAL | 0 refills | Status: DC | PRN

## 2019-05-16 MED ORDER — SULFAMETHOXAZOLE-TRIMETHOPRIM 800-160 MG PO TABS: 1.0000 | ORAL_TABLET | Freq: Two times a day (BID) | ORAL | 0 refills | Status: AC

## 2019-05-16 NOTE — Patient Instructions (Signed)
Paronychia  Paronychia is an infection of the skin that surrounds a nail. It usually affects the skin around a fingernail, but it may also occur near a toenail. It often causes pain and swelling around the nail. In some cases, a collection of pus (abscess) can form near or under the nail.   This condition may develop suddenly, or it may develop gradually over a longer period. In most cases, paronychia is not serious, and it will clear up with treatment.  What are the causes?  This condition may be caused by bacteria or a fungus. These germs can enter the body through an opening in the skin, such as a cut or a hangnail.  What increases the risk?  This condition is more likely to develop in people who:   Get their hands wet often, such as those who work as dishwashers, bartenders, or nurses.   Bite their fingernails or suck their thumbs.   Trim their nails very short.   Have hangnails or injured fingertips.   Get manicures.   Have diabetes.  What are the signs or symptoms?  Symptoms of this condition include:   Redness and swelling of the skin near the nail.   Tenderness around the nail when you touch the area.   Pus-filled bumps under the skin at the base and sides of the nail (cuticle).   Fluid or pus under the nail.   Throbbing pain in the area.  How is this diagnosed?  This condition is diagnosed with a physical exam. In some cases, a sample of pus may be tested to determine what type of bacteria or fungus is causing the condition.  How is this treated?  Treatment depends on the cause and severity of your condition. If your condition is mild, it may clear up on its own in a few days or after soaking in warm water. If needed, treatment may include:   Antibiotic medicine, if your infection is caused by bacteria.   Antifungal medicine, if your infection is caused by a fungus.   A procedure to drain pus from an abscess.   Anti-inflammatory medicine (corticosteroids).  Follow these instructions at  home:  Wound care   Keep the affected area clean.   Soak the affected area in warm water, if told to do so by your health care provider. You may be told to do this for 20 minutes, 2-3 times a day.   Keep the area dry when you are not soaking it.   Do not try to drain an abscess yourself.   Follow instructions from your health care provider about how to take care of the affected area. Make sure you:  ? Wash your hands with soap and water before you change your bandage (dressing). If soap and water are not available, use hand sanitizer.  ? Change your dressing as told by your health care provider.   If you had an abscess drained, check the area every day for signs of infection. Check for:  ? Redness, swelling, or pain.  ? Fluid or blood.  ? Warmth.  ? Pus or a bad smell.  Medicines     Take over-the-counter and prescription medicines only as told by your health care provider.   If you were prescribed an antibiotic medicine, take it as told by your health care provider. Do not stop taking the antibiotic even if you start to feel better.  General instructions   Avoid contact with harsh chemicals.   Do not pick   at the affected area.  Prevention   To prevent this condition from happening again:  ? Wear rubber gloves when washing dishes or doing other tasks that require your hands to get wet.  ? Wear gloves if your hands might come in contact with cleaners or other chemicals.  ? Avoid injuring your nails or fingertips.  ? Do not bite your nails or tear hangnails.  ? Do not cut your nails very short.  ? Do not cut your cuticles.  ? Use clean nail clippers or scissors when trimming nails.  Contact a health care provider if:   Your symptoms get worse or do not improve with treatment.   You have continued or increased fluid, blood, or pus coming from the affected area.   Your finger or knuckle becomes swollen or difficult to move.  Get help right away if you have:   A fever or chills.   Redness spreading away  from the affected area.   Joint or muscle pain.  Summary   Paronychia is an infection of the skin that surrounds a nail. It often causes pain and swelling around the nail. In some cases, a collection of pus (abscess) can form near or under the nail.   This condition may be caused by bacteria or a fungus. These germs can enter the body through an opening in the skin, such as a cut or a hangnail.   If your condition is mild, it may clear up on its own in a few days. If needed, treatment may include medicine or a procedure to drain pus from an abscess.   To prevent this condition from happening again, wear gloves if doing tasks that require your hands to get wet or to come in contact with chemicals. Also avoid injuring your nails or fingertips.  This information is not intended to replace advice given to you by your health care provider. Make sure you discuss any questions you have with your health care provider.  Document Released: 06/09/2001 Document Revised: 12/27/2017 Document Reviewed: 12/27/2017  Elsevier Interactive Patient Education  2019 Elsevier Inc[employer]

## 2019-05-16 NOTE — Progress Notes (Signed)
Subjective:  Patient ID: Angel Combs, male    DOB: 27-Oct-1966  Age: 53 y.o. MRN: [id number]  CC: Hand Pain   HPI Angel Combs presents for a 3 day hx of non-traumatic pain, redness, swelling on the dorsum of the distal phalanx of his RIF.  The pain from this has kept him awake at night and it has interfered with his activities.  He has not gotten much symptom relief with aspirin.  Outpatient Medications Prior to Visit  Medication Sig Dispense Refill  . atorvastatin (LIPITOR) 10 MG tablet Take 1 tablet (10 mg total) by mouth daily. 90 tablet 1  . citalopram (CELEXA) 10 MG tablet Take 1 tablet (10 mg total) by mouth daily. 90 tablet 1  . fluticasone (FLONASE) 50 MCG/ACT nasal spray Place 2 sprays into both nostrils daily. 48 g 1  . levocetirizine (XYZAL) 5 MG tablet Take 1 tablet (5 mg total) by mouth every evening. 90 tablet 1  . omega-3 acid ethyl esters (LOVAZA) 1 g capsule Take 2 capsules (2 g total) by mouth 2 (two) times daily. 360 capsule 1  . Vitamin D, Ergocalciferol, (DRISDOL) 1.25 MG (50000 UT) CAPS capsule Take 1 capsule (50,000 Units total) by mouth every 7 (seven) days. 12 capsule 0   No facility-administered medications prior to visit.     ROS Review of Systems  Constitutional: Negative for chills and fever.  Skin: Positive for color change. Negative for pallor, rash and wound.    Objective:  BP 120/70   Pulse 64   Temp 98.2 F (36.8 C)   Resp 16   SpO2 98%   BP Readings from Last 3 Encounters:  05/16/19 120/70  05/02/19 116/82  02/14/19 120/60    Wt Readings from Last 3 Encounters:  05/02/19 178 lb (80.7 kg)  02/14/19 179 lb (81.2 kg)  01/10/19 179 lb (81.2 kg)    Physical Exam Musculoskeletal:       Hands:  Skin:    Comments: The RIF was cleaned with Betadine and then prepped and draped in sterile fashion.  Local anesthesia was obtained with the instillation of 1 cc of 2% plain lidocaine injected just proximal to the paronychia.  After anesthesia  was obtained an 11 blade was used to make an incision.  A copious amount of pus and some blood was extracted.  He tolerated this well.  Afterwards, sensation and capillary refill are intact.  A pressure dressing was applied and hemostasis was obtained.     Lab Results  Component Value Date   WBC 7.1 11/29/2018   HGB 14.0 11/29/2018   HCT 40.5 11/29/2018   PLT 271.0 11/29/2018   GLUCOSE 84 11/17/2018   CHOL 260 (H) 11/17/2018   TRIG 267.0 (H) 11/17/2018   HDL 58.20 11/17/2018   LDLDIRECT 80.0 11/17/2018   LDLCALC 180 (H) 05/25/2018   ALT 18 05/16/2018   AST 17 05/16/2018   NA 137 11/17/2018   K 3.9 11/17/2018   CL 101 11/17/2018   CREATININE 1.37 11/17/2018   BUN 13 11/17/2018   CO2 26 11/17/2018   TSH 2.27 05/16/2018   PSA 0.25 05/16/2018    Dg Shoulder Left  Result Date: 04/13/2016 CLINICAL DATA:  Chronic left shoulder pain, worsening. No known injury. EXAM: LEFT SHOULDER - 2+ VIEW COMPARISON:  None. FINDINGS: There is no evidence of fracture or dislocation. There is no evidence of arthropathy or other focal bone abnormality. Soft tissues are unremarkable. IMPRESSION: Negative. Electronically Signed   By: Caryn BeeKevin  Dover M.D.   On: 04/13/2016 16:46    Assessment & Plan:   Dushon was seen today for hand pain.  Diagnoses and all orders for this visit:  Paronychia of right index finger- I have asked him to keep the dressing in place for the next 24 hours and then to remove it and examine the area.  He will let me know if he develops any new or worsening symptoms.  A culture of the pus has been sent.  I recommended he take hydrocodone and Tylenol as needed for the pain.  I will a treat this for staph infection with a 7 day course Bactrim DS. -     sulfamethoxazole-trimethoprim (BACTRIM DS) 800-160 MG tablet; Take 1 tablet by mouth 2 (two) times daily for 7 days. -     Discontinue: HYDROcodone-acetaminophen (NORCO/VICODIN) 5-325 MG tablet; Take 1 tablet by mouth every 4 (four) hours  as needed for moderate pain. -     HYDROcodone-acetaminophen (NORCO/VICODIN) 5-325 MG tablet; Take 1 tablet by mouth every 4 (four) hours as needed for moderate pain. -     WOUND CULTURE; Future   I am having Angel Maudlin start on sulfamethoxazole-trimethoprim. I am also having him maintain his atorvastatin, fluticasone, levocetirizine, citalopram, omega-3 acid ethyl esters, Vitamin D (Ergocalciferol), and HYDROcodone-acetaminophen.  Meds ordered this encounter  Medications  . sulfamethoxazole-trimethoprim (BACTRIM DS) 800-160 MG tablet    Sig: Take 1 tablet by mouth 2 (two) times daily for 7 days.    Dispense:  14 tablet    Refill:  0  . DISCONTD: HYDROcodone-acetaminophen (NORCO/VICODIN) 5-325 MG tablet    Sig: Take 1 tablet by mouth every 4 (four) hours as needed for moderate pain.    Dispense:  15 tablet    Refill:  0  . HYDROcodone-acetaminophen (NORCO/VICODIN) 5-325 MG tablet    Sig: Take 1 tablet by mouth every 4 (four) hours as needed for moderate pain.    Dispense:  15 tablet    Refill:  0     Follow-up: Return in about 2 days (around 05/18/2019).  Sanda Linger, MD

## 2019-05-17 ENCOUNTER — Encounter: Payer: Self-pay | Admitting: Internal Medicine

## 2019-05-17 ENCOUNTER — Ambulatory Visit (INDEPENDENT_AMBULATORY_CARE_PROVIDER_SITE_OTHER)
Admission: RE | Admit: 2019-05-17 | Discharge: 2019-05-17 | Disposition: A | Discharge status: Home / Self Care | Source: Ambulatory Visit | Attending: Internal Medicine | Admitting: Internal Medicine

## 2019-05-17 ENCOUNTER — Ambulatory Visit (INDEPENDENT_AMBULATORY_CARE_PROVIDER_SITE_OTHER): Admitting: Internal Medicine

## 2019-05-17 VITALS — BP 130/90 | HR 68 | Temp 98.1°F | Resp 16 | Ht 71.0 in | Wt 178.0 lb

## 2019-05-17 DIAGNOSIS — L03011 Cellulitis of right finger: Secondary | ICD-10-CM

## 2019-05-17 IMAGING — DX RIGHT INDEX FINGER 2+V
3 series · 3 of 3 positions shown · non-contrast
Comparison: None.

CLINICAL DATA: Pain and swelling involving the index fingers since
this past [REDACTED]. No known injury.

EXAM:
RIGHT INDEX FINGER 2+V

[finger ap]
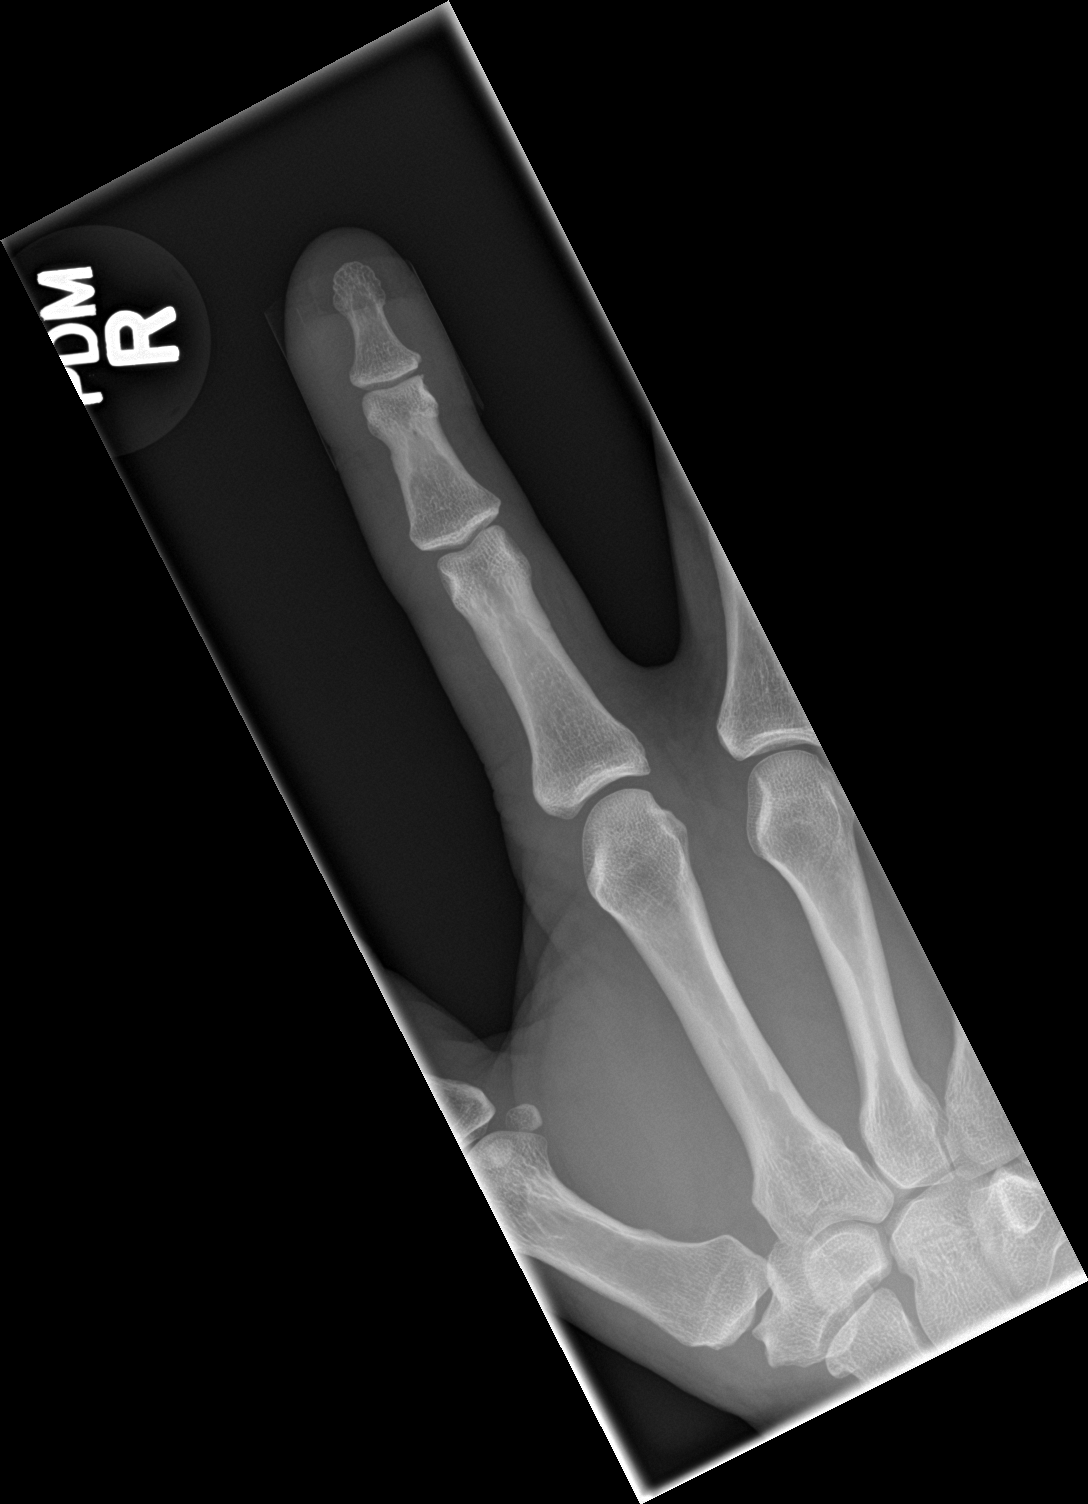

[finger obl]
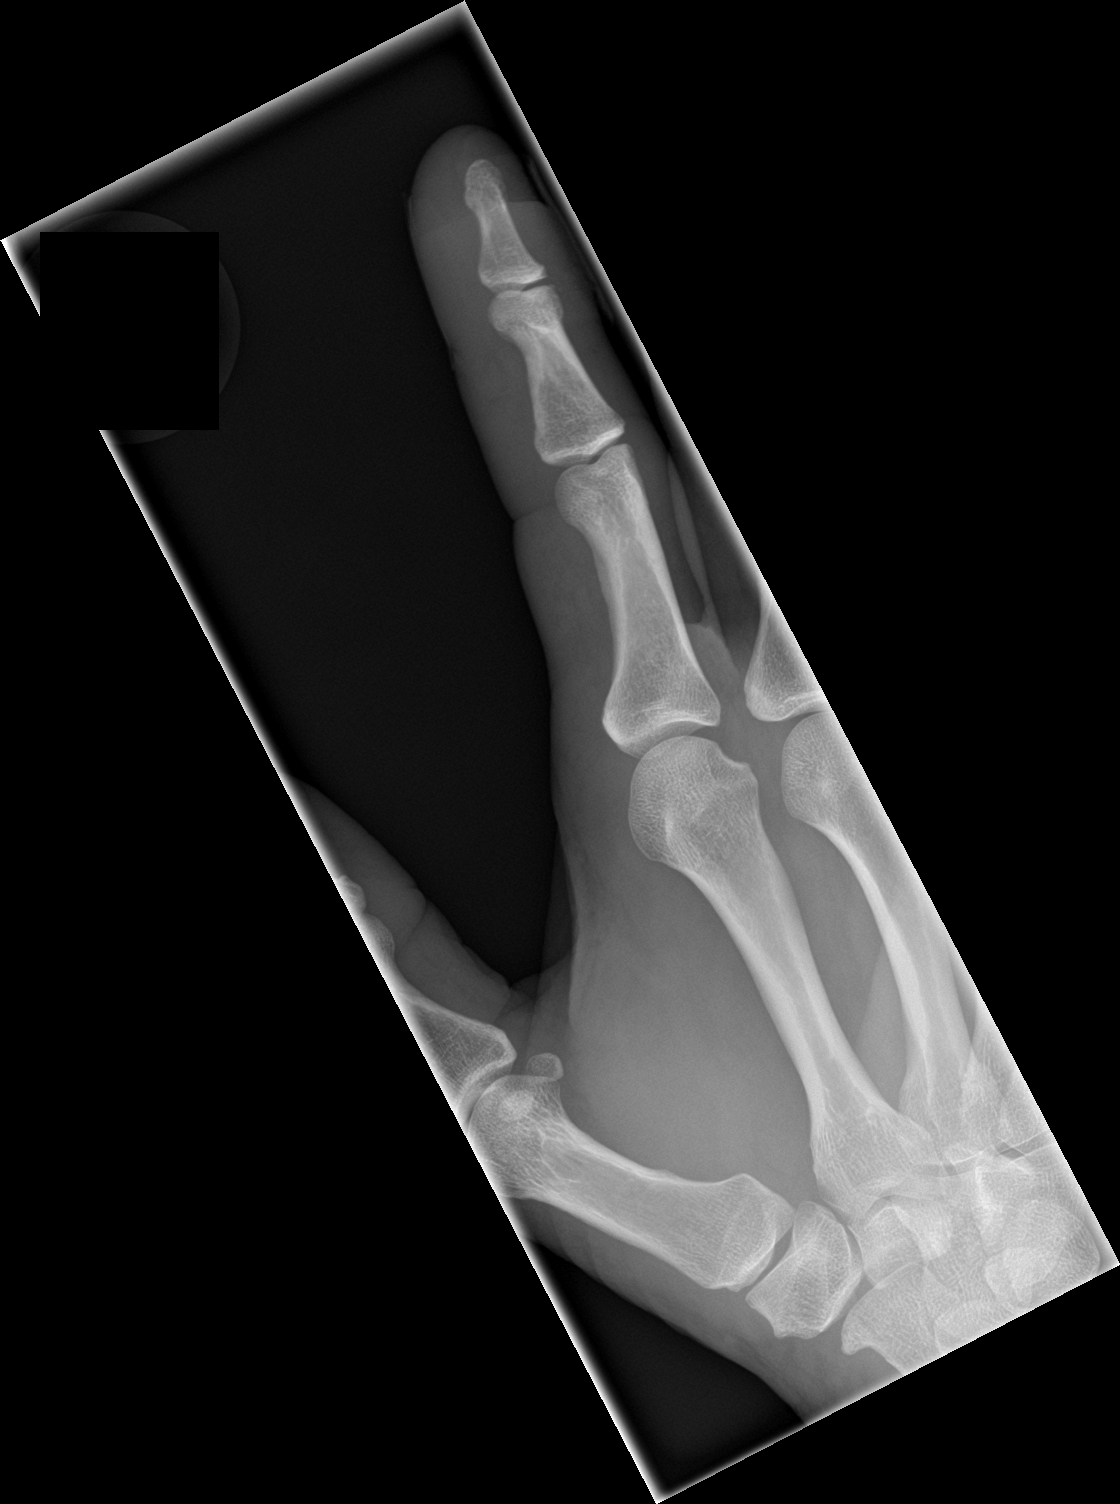

[finger lat]
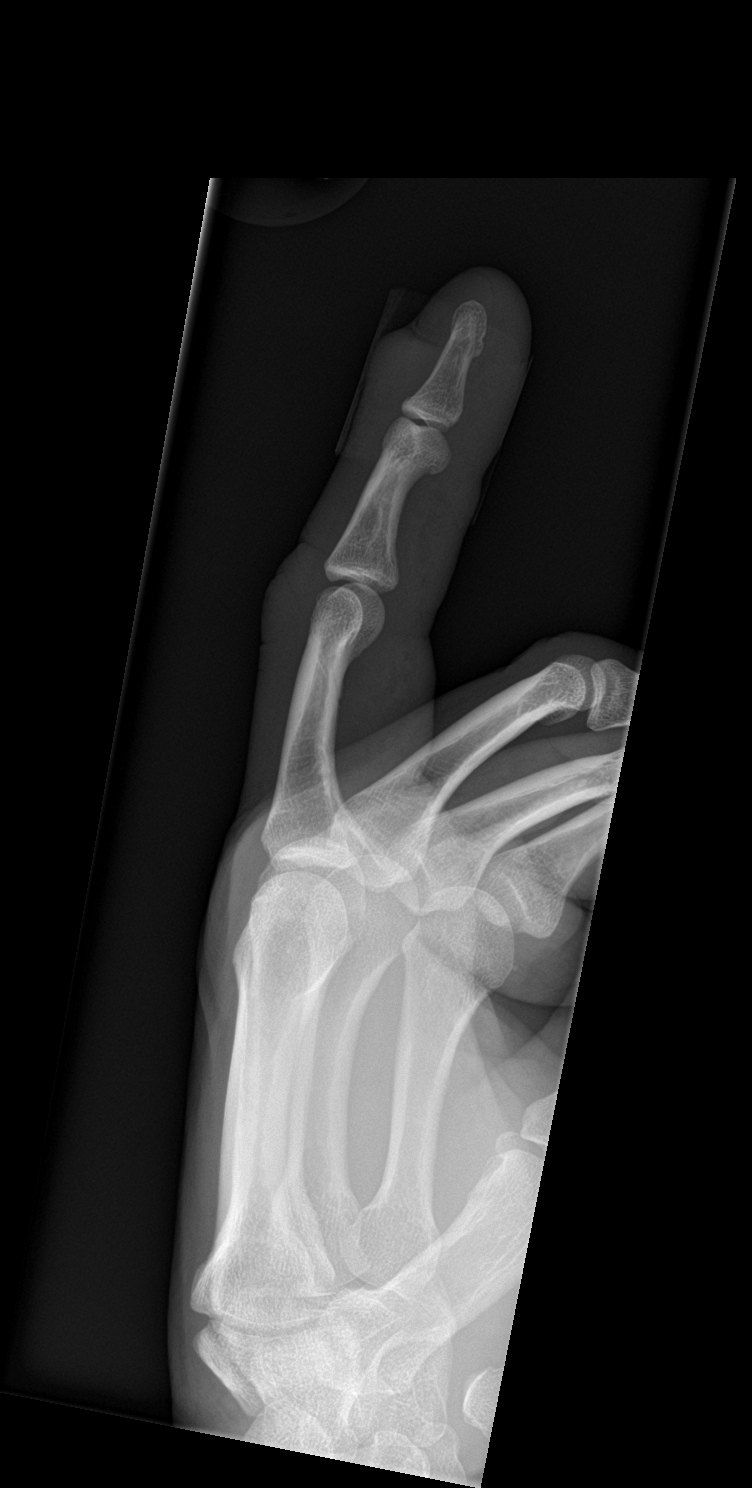

[3 of 3 positions shown; findings below may reference images not displayed]

FINDINGS: Soft tissue swelling about the dorsal aspect of the DIP joint of the
index finger. No associated fracture or radiopaque foreign body.
Joint spaces appear preserved. No erosions. No discrete areas of
osteolysis to suggest osteomyelitis.
IMPRESSION: Nonspecific swelling about the soft tissues dorsal to the DIP joint
of the index finger without associated fracture, radiopaque foreign
body or radiographic evidence of osteomyelitis.

## 2019-05-17 NOTE — Patient Instructions (Signed)
Paronychia  Paronychia is an infection of the skin that surrounds a nail. It usually affects the skin around a fingernail, but it may also occur near a toenail. It often causes pain and swelling around the nail. In some cases, a collection of pus (abscess) can form near or under the nail.   This condition may develop suddenly, or it may develop gradually over a longer period. In most cases, paronychia is not serious, and it will clear up with treatment.  What are the causes?  This condition may be caused by bacteria or a fungus. These germs can enter the body through an opening in the skin, such as a cut or a hangnail.  What increases the risk?  This condition is more likely to develop in people who:   Get their hands wet often, such as those who work as dishwashers, bartenders, or nurses.   Bite their fingernails or suck their thumbs.   Trim their nails very short.   Have hangnails or injured fingertips.   Get manicures.   Have diabetes.  What are the signs or symptoms?  Symptoms of this condition include:   Redness and swelling of the skin near the nail.   Tenderness around the nail when you touch the area.   Pus-filled bumps under the skin at the base and sides of the nail (cuticle).   Fluid or pus under the nail.   Throbbing pain in the area.  How is this diagnosed?  This condition is diagnosed with a physical exam. In some cases, a sample of pus may be tested to determine what type of bacteria or fungus is causing the condition.  How is this treated?  Treatment depends on the cause and severity of your condition. If your condition is mild, it may clear up on its own in a few days or after soaking in warm water. If needed, treatment may include:   Antibiotic medicine, if your infection is caused by bacteria.   Antifungal medicine, if your infection is caused by a fungus.   A procedure to drain pus from an abscess.   Anti-inflammatory medicine (corticosteroids).  Follow these instructions at  home:  Wound care   Keep the affected area clean.   Soak the affected area in warm water, if told to do so by your health care provider. You may be told to do this for 20 minutes, 2-3 times a day.   Keep the area dry when you are not soaking it.   Do not try to drain an abscess yourself.   Follow instructions from your health care provider about how to take care of the affected area. Make sure you:  ? Wash your hands with soap and water before you change your bandage (dressing). If soap and water are not available, use hand sanitizer.  ? Change your dressing as told by your health care provider.   If you had an abscess drained, check the area every day for signs of infection. Check for:  ? Redness, swelling, or pain.  ? Fluid or blood.  ? Warmth.  ? Pus or a bad smell.  Medicines     Take over-the-counter and prescription medicines only as told by your health care provider.   If you were prescribed an antibiotic medicine, take it as told by your health care provider. Do not stop taking the antibiotic even if you start to feel better.  General instructions   Avoid contact with harsh chemicals.   Do not pick   at the affected area.  Prevention   To prevent this condition from happening again:  ? Wear rubber gloves when washing dishes or doing other tasks that require your hands to get wet.  ? Wear gloves if your hands might come in contact with cleaners or other chemicals.  ? Avoid injuring your nails or fingertips.  ? Do not bite your nails or tear hangnails.  ? Do not cut your nails very short.  ? Do not cut your cuticles.  ? Use clean nail clippers or scissors when trimming nails.  Contact a health care provider if:   Your symptoms get worse or do not improve with treatment.   You have continued or increased fluid, blood, or pus coming from the affected area.   Your finger or knuckle becomes swollen or difficult to move.  Get help right away if you have:   A fever or chills.   Redness spreading away  from the affected area.   Joint or muscle pain.  Summary   Paronychia is an infection of the skin that surrounds a nail. It often causes pain and swelling around the nail. In some cases, a collection of pus (abscess) can form near or under the nail.   This condition may be caused by bacteria or a fungus. These germs can enter the body through an opening in the skin, such as a cut or a hangnail.   If your condition is mild, it may clear up on its own in a few days. If needed, treatment may include medicine or a procedure to drain pus from an abscess.   To prevent this condition from happening again, wear gloves if doing tasks that require your hands to get wet or to come in contact with chemicals. Also avoid injuring your nails or fingertips.  This information is not intended to replace advice given to you by your health care provider. Make sure you discuss any questions you have with your health care provider.  Document Released: 06/09/2001 Document Revised: 12/27/2017 Document Reviewed: 12/27/2017  Elsevier Interactive Patient Education  2019 Elsevier Inc[employer]

## 2019-05-17 NOTE — Progress Notes (Signed)
Subjective:  Patient ID: Angel Combs, male    DOB: 11/04/1966  Age: 53 y.o. MRN: [id number]  CC: Wound Check   HPI Angel Combs presents for f/up - He is 1 day status post incision and drainage of a paronychia on his right index finger.  He says in some ways it feels better but there is also some redness and swelling.  He is taking the antibiotics as directed.  He denies any paresthesias in the tip of his finger.  Outpatient Medications Prior to Visit  Medication Sig Dispense Refill  . atorvastatin (LIPITOR) 10 MG tablet Take 1 tablet (10 mg total) by mouth daily. 90 tablet 1  . citalopram (CELEXA) 10 MG tablet Take 1 tablet (10 mg total) by mouth daily. 90 tablet 1  . fluticasone (FLONASE) 50 MCG/ACT nasal spray Place 2 sprays into both nostrils daily. 48 g 1  . HYDROcodone-acetaminophen (NORCO/VICODIN) 5-325 MG tablet Take 1 tablet by mouth every 4 (four) hours as needed for moderate pain. 15 tablet 0  . levocetirizine (XYZAL) 5 MG tablet Take 1 tablet (5 mg total) by mouth every evening. 90 tablet 1  . omega-3 acid ethyl esters (LOVAZA) 1 g capsule Take 2 capsules (2 g total) by mouth 2 (two) times daily. 360 capsule 1  . sulfamethoxazole-trimethoprim (BACTRIM DS) 800-160 MG tablet Take 1 tablet by mouth 2 (two) times daily for 7 days. 14 tablet 0  . Vitamin D, Ergocalciferol, (DRISDOL) 1.25 MG (50000 UT) CAPS capsule Take 1 capsule (50,000 Units total) by mouth every 7 (seven) days. 12 capsule 0   No facility-administered medications prior to visit.     ROS Review of Systems  Constitutional: Negative for chills, fatigue and fever.  Respiratory: Negative for cough, shortness of breath and wheezing.   Gastrointestinal: Negative for abdominal pain, diarrhea and nausea.  Genitourinary: Negative.  Negative for difficulty urinating.  Musculoskeletal: Negative.   Skin: Positive for color change. Negative for rash.  Neurological: Negative.  Negative for dizziness.  Hematological:  Negative.   Psychiatric/Behavioral: Negative.     Objective:  BP 130/90 (BP Location: Left Arm, Patient Position: Sitting, Cuff Size: Normal)   Pulse 68   Temp 98.1 F (36.7 C) (Oral)   Resp 16   Ht 5\' 11"  (1.803 m)   Wt 178 lb (80.7 kg)   SpO2 98%   BMI 24.83 kg/m   BP Readings from Last 3 Encounters:  05/17/19 130/90  05/16/19 120/70  05/02/19 116/82    Wt Readings from Last 3 Encounters:  05/17/19 178 lb (80.7 kg)  05/02/19 178 lb (80.7 kg)  02/14/19 179 lb (81.2 kg)    Physical Exam Musculoskeletal:       Hands:     Lab Results  Component Value Date   WBC 7.1 11/29/2018   HGB 14.0 11/29/2018   HCT 40.5 11/29/2018   PLT 271.0 11/29/2018   GLUCOSE 84 11/17/2018   CHOL 260 (H) 11/17/2018   TRIG 267.0 (H) 11/17/2018   HDL 58.20 11/17/2018   LDLDIRECT 80.0 11/17/2018   LDLCALC 180 (H) 05/25/2018   ALT 18 05/16/2018   AST 17 05/16/2018   NA 137 11/17/2018   K 3.9 11/17/2018   CL 101 11/17/2018   CREATININE 1.37 11/17/2018   BUN 13 11/17/2018   CO2 26 11/17/2018   TSH 2.27 05/16/2018   PSA 0.25 05/16/2018    Dg Shoulder Left  Result Date: 04/13/2016 CLINICAL DATA:  Chronic left shoulder pain, worsening. No known injury. EXAM:  LEFT SHOULDER - 2+ VIEW COMPARISON:  None. FINDINGS: There is no evidence of fracture or dislocation. There is no evidence of arthropathy or other focal bone abnormality. Soft tissues are unremarkable. IMPRESSION: Negative. Electronically Signed   By: Charlett Nose M.D.   On: 04/13/2016 16:46    Dg Finger Index Right  Result Date: 05/17/2019 CLINICAL DATA:  Pain and swelling involving the index fingers since this past Sunday. No known injury. EXAM: RIGHT INDEX FINGER 2+V COMPARISON:  None. FINDINGS: Soft tissue swelling about the dorsal aspect of the DIP joint of the index finger. No associated fracture or radiopaque foreign body. Joint spaces appear preserved. No erosions. No discrete areas of osteolysis to suggest osteomyelitis.  IMPRESSION: Nonspecific swelling about the soft tissues dorsal to the DIP joint of the index finger without associated fracture, radiopaque foreign body or radiographic evidence of osteomyelitis. Electronically Signed   By: Simonne Come M.D.   On: 05/17/2019 16:09    Assessment & Plan:   Trevone was seen today for wound check.  Diagnoses and all orders for this visit:  Paronychia of right index finger- There is some improvement noted 1 day status post incision and drainage.  I was not able to express more pus today.  I await the results of the culture.  I will adjust the antibiotic regimen if indicated.  I have asked him to keep the right index finger elevated and to irrigate it twice a day with hydrogen peroxide.  He will let me know if he develops any new or worsening symptoms. -     DG Finger Index Right; Future   I am having Angel Maudlin maintain his atorvastatin, fluticasone, levocetirizine, citalopram, omega-3 acid ethyl esters, Vitamin D (Ergocalciferol), sulfamethoxazole-trimethoprim, and HYDROcodone-acetaminophen.  No orders of the defined types were placed in this encounter.    Follow-up: Return in about 5 days (around 05/22/2019).  Sanda Linger, MD

## 2019-05-20 LAB — WOUND CULTURE
MICRO NUMBER:: 487358
SPECIMEN QUALITY:: ADEQUATE

## 2019-05-24 ENCOUNTER — Other Ambulatory Visit: Payer: Self-pay | Admitting: Internal Medicine

## 2019-05-24 DIAGNOSIS — J301 Allergic rhinitis due to pollen: Secondary | ICD-10-CM

## 2019-05-26 ENCOUNTER — Other Ambulatory Visit: Payer: Self-pay | Admitting: Internal Medicine

## 2019-05-26 DIAGNOSIS — J301 Allergic rhinitis due to pollen: Secondary | ICD-10-CM

## 2019-05-26 DIAGNOSIS — E782 Mixed hyperlipidemia: Secondary | ICD-10-CM

## 2019-06-13 ENCOUNTER — Other Ambulatory Visit: Payer: Self-pay

## 2019-06-13 ENCOUNTER — Ambulatory Visit (INDEPENDENT_AMBULATORY_CARE_PROVIDER_SITE_OTHER): Admitting: Family Medicine

## 2019-06-13 ENCOUNTER — Ambulatory Visit: Payer: Self-pay

## 2019-06-13 ENCOUNTER — Encounter: Payer: Self-pay | Admitting: Family Medicine

## 2019-06-13 VITALS — BP 132/72 | Ht 71.0 in | Wt 179.0 lb

## 2019-06-13 DIAGNOSIS — G8929 Other chronic pain: Secondary | ICD-10-CM

## 2019-06-13 DIAGNOSIS — M25512 Pain in left shoulder: Secondary | ICD-10-CM | POA: Diagnosis not present

## 2019-06-13 DIAGNOSIS — S46012D Strain of muscle(s) and tendon(s) of the rotator cuff of left shoulder, subsequent encounter: Secondary | ICD-10-CM

## 2019-06-13 NOTE — Progress Notes (Signed)
[facility] [place], [place] [address] Phone: [phone] Subjective:   Angel Combs, am serving as a scribe for Dr. Hulan Combs.  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  Seems to be the lateral compartment.  Discussed compression socks, over-the-counter orthotics and proper shoes.  Discussed exercises and work with Product/process development scientist.  Follow-up again 6 weeks  Injected reactive bursitis.  Discussed HEP  Discussed which activities  RTC in 6 weeks  Update 06/13/2019: Angel Combs is a 53 y.o. male coming in with complaint of left shoulder pain had rotator cuff tear. Has been running and doing some push ups. Has not been having pain in left shoulder but is not back in the gym.  Patient states that there is days when he does not consider anything is wrong with the shoulder.  Forgets even had shoulder problems.  Patient did get buy some HOKA shoes and inserts. Lower leg pain has gone away.      Past Medical History:  Diagnosis Date  . ALLERGIC RHINITIS CAUSE UNSPECIFIED   . Anxiety    using celxa.  [name] LACTOSE INTOLERANCE   . Left shoulder pain July '13   had tried accupuncture.   . Pain of left heel   . PATELLO-FEMORAL SYNDROME    sees chiropractor with good results using exercises.   Past Surgical History:  Procedure Laterality Date  . lasik  Augu 22, 2013  . NOSE SURGERY  1991   Social History   Socioeconomic History  . Marital status: Single    Spouse name: Not on file  . Number of children: 0  . Years of education: 74  . Highest education level: Not on file  Occupational History  . Occupation: Real Data processing manager  Social Needs  . Financial resource strain: Not on file  . Food insecurity    Worry: Not on file    Inability: Not on file  . Transportation needs    Medical: Not on file    Non-medical:  Not on file  Tobacco Use  . Smoking status: Never Smoker  . Smokeless tobacco: Never Used  Substance and Sexual Activity  . Alcohol use: Yes    Alcohol/week: 15.0 standard drinks    Types: 15 drink(s) per week  . Drug use: Combs  . Sexual activity: Yes    Partners: Female  Lifestyle  . Physical activity    Days per week: Not on file    Minutes per session: Not on file  . Stress: Not on file  Relationships  . Social Herbalist on phone: Not on file    Gets together: Not on file    Attends religious service: Not on file    Active member of club or organization: Not on file    Attends meetings of clubs or organizations: Not on file    Relationship status: Not on file  Other Topics Concern  . Not on file  Social History Narrative   HSG, [employer]/ROTC. BS, MBA, MA-geography, MA foreign affairs  [place], [place]- MIS. 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 Single. 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 On the road a lot. Combs regular exercise program.   Combs Known Allergies Family History  Problem Relation Age of Onset  . Hyperlipidemia Father   .  Hypertension Father   . Coronary artery disease Father   . Diabetes Maternal Grandmother   . Coronary artery disease Other   . Arthritis Mother        back pain  . Cancer Neg Hx   . Depression Neg Hx   . Early death Neg Hx   . Hearing loss Neg Hx   . Heart disease Neg Hx   . Kidney disease Neg Hx   . Stroke Neg Hx      Current Outpatient Medications (Cardiovascular):  .  atorvastatin (LIPITOR) 10 MG tablet, TAKE 1 TABLET(10 MG) BY MOUTH DAILY .  omega-3 acid ethyl esters (LOVAZA) 1 g capsule, Take 2 capsules (2 g total) by mouth 2 (two) times daily.  Current Outpatient Medications (Respiratory):  .  fluticasone (FLONASE) 50 MCG/ACT nasal spray, SHAKE LIQUID AND USE 2 SPRAYS IN EACH NOSTRIL DAILY .  levocetirizine (XYZAL) 5 MG tablet, TAKE 1 TABLET(5 MG) BY MOUTH EVERY EVENING  Current  Outpatient Medications (Analgesics):  .  HYDROcodone-acetaminophen (NORCO/VICODIN) 5-325 MG tablet, Take 1 tablet by mouth every 4 (four) hours as needed for moderate pain.   Current Outpatient Medications (Other):  .  citalopram (CELEXA) 10 MG tablet, Take 1 tablet (10 mg total) by mouth daily. .  Vitamin D, Ergocalciferol, (DRISDOL) 1.25 MG (50000 UT) CAPS capsule, Take 1 capsule (50,000 Units total) by mouth every 7 (seven) days.    Past medical history, social, surgical and family history all reviewed in electronic medical record.  Combs pertanent information unless stated regarding to the chief complaint.   Review of Systems:  Combs headache, visual changes, nausea, vomiting, diarrhea, constipation, dizziness, abdominal pain, skin rash, fevers, chills, night sweats, weight loss, swollen lymph nodes, body aches, joint swelling,  chest pain, shortness of breath, mood changes.  Positive muscle aches  Objective  Blood pressure 132/72, height 5\' 11"  (1.803 m), weight 179 lb (81.2 kg).   General: Combs apparent distress alert and oriented x3 mood and affect normal, dressed appropriately.  HEENT: Pupils equal, extraocular movements intact  Respiratory: Patient's speak in full sentences and does not appear short of breath  Cardiovascular: Combs lower extremity edema, non tender, Combs erythema  Skin: Warm dry intact with Combs signs of infection or rash on extremities or on axial skeleton.  Abdomen: Soft nontender  Neuro: Cranial nerves II through XII are intact, neurovascularly intact in all extremities with 2+ DTRs and 2+ pulses.  Lymph: Combs lymphadenopathy of posterior or anterior cervical chain or axillae bilaterally.  Gait normal with good balance and coordination.  MSK:  Non tender with full range of motion and good stability and symmetric strength and tone of  elbows, wrist, hip, knee and ankles bilaterally.   Shoulder: Left Inspection reveals Combs abnormalities, atrophy or asymmetry. Palpation is  normal with Combs tenderness over AC joint or bicipital groove. ROM is full in all planes. Rotator cuff strength 4+ out of 5 compared to the contralateral side Combs signs of impingement with negative Neer and Hawkin's tests, empty can sign. Speeds and Yergason's tests normal. Combs labral pathology noted with negative Obrien's, negative clunk and good stability. Normal scapular function observed. Combs painful arc and Combs drop arm sign. Combs apprehension sign Contralateral shoulder unremarkable  Neck: Inspection unremarkable. Combs palpable stepoffs. Negative Spurling's maneuver. Full neck range of motion Grip strength and sensation normal in bilateral hands Strength good C4 to T1 distribution Combs sensory change to C4 to T1 Negative Hoffman sign bilaterally Reflexes normal  Impression and Recommendations:     This case required medical decision making of moderate complexity. The above documentation has been reviewed and is accurate and complete Angel Pulley, DO       Note: This dictation was prepared with Dragon dictation along with smaller phrase technology. Any transcriptional errors that result from this process are unintentional.

## 2019-06-13 NOTE — Assessment & Plan Note (Signed)
Patient is doing remarkably well at this time.  Minimal inflammation in the area.  Encourage patient to continue to increase activity as tolerated.  Follow-up with me as needed

## 2019-07-02 ENCOUNTER — Encounter: Payer: Self-pay | Admitting: Family Medicine

## 2019-07-03 ENCOUNTER — Encounter: Payer: Self-pay | Admitting: Family Medicine

## 2019-07-12 ENCOUNTER — Encounter: Payer: Self-pay | Admitting: Family Medicine

## 2019-07-23 ENCOUNTER — Other Ambulatory Visit: Payer: Self-pay | Admitting: Family Medicine

## 2019-08-19 ENCOUNTER — Other Ambulatory Visit: Payer: Self-pay | Admitting: Internal Medicine

## 2019-08-19 DIAGNOSIS — E781 Pure hyperglyceridemia: Secondary | ICD-10-CM

## 2019-08-29 ENCOUNTER — Encounter: Payer: Self-pay | Admitting: Internal Medicine

## 2019-08-29 ENCOUNTER — Other Ambulatory Visit: Payer: Self-pay | Admitting: Internal Medicine

## 2019-08-29 DIAGNOSIS — L659 Nonscarring hair loss, unspecified: Secondary | ICD-10-CM | POA: Insufficient documentation

## 2019-08-29 MED ORDER — FINASTERIDE 5 MG PO TABS: 5.0000 mg | ORAL_TABLET | Freq: Every day | ORAL | 1 refills | Status: DC

## 2019-08-31 ENCOUNTER — Ambulatory Visit (INDEPENDENT_AMBULATORY_CARE_PROVIDER_SITE_OTHER)

## 2019-08-31 DIAGNOSIS — Z23 Encounter for immunization: Secondary | ICD-10-CM | POA: Diagnosis not present

## 2019-10-20 ENCOUNTER — Other Ambulatory Visit: Payer: Self-pay | Admitting: Family Medicine

## 2019-11-20 ENCOUNTER — Encounter: Payer: Self-pay | Admitting: Internal Medicine

## 2019-11-20 ENCOUNTER — Other Ambulatory Visit (INDEPENDENT_AMBULATORY_CARE_PROVIDER_SITE_OTHER)

## 2019-11-20 ENCOUNTER — Ambulatory Visit (INDEPENDENT_AMBULATORY_CARE_PROVIDER_SITE_OTHER): Admitting: Internal Medicine

## 2019-11-20 ENCOUNTER — Other Ambulatory Visit: Payer: Self-pay

## 2019-11-20 VITALS — BP 116/82 | HR 60 | Temp 98.0°F | Resp 16 | Ht 71.0 in | Wt 176.0 lb

## 2019-11-20 DIAGNOSIS — I10 Essential (primary) hypertension: Secondary | ICD-10-CM | POA: Diagnosis not present

## 2019-11-20 DIAGNOSIS — Z0000 Encounter for general adult medical examination without abnormal findings: Secondary | ICD-10-CM

## 2019-11-20 DIAGNOSIS — Z23 Encounter for immunization: Secondary | ICD-10-CM | POA: Diagnosis not present

## 2019-11-20 DIAGNOSIS — E781 Pure hyperglyceridemia: Secondary | ICD-10-CM | POA: Diagnosis not present

## 2019-11-20 LAB — CBC WITH DIFFERENTIAL/PLATELET
Basophils Absolute: 0 10*3/uL (ref 0.0–0.1)
Basophils Relative: 0.7 % (ref 0.0–3.0)
Eosinophils Absolute: 0.2 10*3/uL (ref 0.0–0.7)
Eosinophils Relative: 3 % (ref 0.0–5.0)
HCT: 40.4 % (ref 39.0–52.0)
Hemoglobin: 13.9 g/dL (ref 13.0–17.0)
Lymphocytes Relative: 36 % (ref 12.0–46.0)
Lymphs Abs: 2 10*3/uL (ref 0.7–4.0)
MCHC: 34.3 g/dL (ref 30.0–36.0)
MCV: 92.3 fl (ref 78.0–100.0)
Monocytes Absolute: 0.4 10*3/uL (ref 0.1–1.0)
Monocytes Relative: 7.9 % (ref 3.0–12.0)
Neutro Abs: 2.9 10*3/uL (ref 1.4–7.7)
Neutrophils Relative %: 52.4 % (ref 43.0–77.0)
Platelets: 223 10*3/uL (ref 150.0–400.0)
RBC: 4.37 Mil/uL (ref 4.22–5.81)
RDW: 12.9 % (ref 11.5–15.5)
WBC: 5.5 10*3/uL (ref 4.0–10.5)

## 2019-11-20 LAB — BASIC METABOLIC PANEL
BUN: 16 mg/dL (ref 6–23)
CO2: 26 mEq/L (ref 19–32)
Calcium: 9.3 mg/dL (ref 8.4–10.5)
Chloride: 99 mEq/L (ref 96–112)
Creatinine, Ser: 1.18 mg/dL (ref 0.40–1.50)
GFR: 64.9 mL/min (ref >60.00)
Glucose, Bld: 92 mg/dL (ref 70–99)
Potassium: 4.2 mEq/L (ref 3.5–5.1)
Sodium: 135 mEq/L (ref 135–145)

## 2019-11-20 LAB — URINALYSIS, ROUTINE W REFLEX MICROSCOPIC
Bilirubin Urine: NEGATIVE
Hgb urine dipstick: NEGATIVE
Ketones, ur: NEGATIVE
Leukocytes,Ua: NEGATIVE
Nitrite: NEGATIVE
RBC / HPF: NONE SEEN (ref 0–?)
Specific Gravity, Urine: <=1.005 — AB (ref 1.000–1.030)
Total Protein, Urine: NEGATIVE
Urine Glucose: NEGATIVE
Urobilinogen, UA: 0.2 (ref 0.0–1.0)
pH: 6.5 (ref 5.0–8.0)

## 2019-11-20 LAB — LIPID PANEL
Cholesterol: 177 mg/dL (ref 0–200)
HDL: 49.5 mg/dL (ref >39.00)
LDL Cholesterol: 93 mg/dL (ref 0–99)
NonHDL: 127.66
Total CHOL/HDL Ratio: 4
Triglycerides: 171 mg/dL — ABNORMAL HIGH (ref 0.0–149.0)
VLDL: 34.2 mg/dL (ref 0.0–40.0)

## 2019-11-20 LAB — HEPATIC FUNCTION PANEL
ALT: 21 U/L (ref 0–53)
AST: 22 U/L (ref 0–37)
Albumin: 4.2 g/dL (ref 3.5–5.2)
Alkaline Phosphatase: 48 U/L (ref 39–117)
Bilirubin, Direct: 0.1 mg/dL (ref 0.0–0.3)
Total Bilirubin: 0.6 mg/dL (ref 0.2–1.2)
Total Protein: 7.2 g/dL (ref 6.0–8.3)

## 2019-11-20 LAB — TSH: TSH: 2.28 u[IU]/mL (ref 0.35–4.50)

## 2019-11-20 LAB — PSA: PSA: 0.16 ng/mL (ref 0.10–4.00)

## 2019-11-20 LAB — VITAMIN D 25 HYDROXY (VIT D DEFICIENCY, FRACTURES): VITD: 54.9 ng/mL (ref 30.00–100.00)

## 2019-11-20 NOTE — Patient Instructions (Signed)

## 2019-11-20 NOTE — Progress Notes (Signed)
Subjective:  Patient ID: Angel Combs, male    DOB: 1966-08-25  Age: 53 y.o. MRN: [id number]  CC: Annual Exam, Hypertension, and Hyperlipidemia  This visit occurred during the SARS-CoV-2 public health emergency.  Safety protocols were in place, including screening questions prior to the visit, additional usage of staff PPE, and extensive cleaning of exam room while observing appropriate contact time as indicated for disinfecting solutions.    HPI Angel Combs presents for a CPX.  He has recently seen reports that high MMR titers are protective against COVID-19 infection so he requests an MMR booster.  He has felt well recently and offers no complaints.  He is an active runner and denies any recent episodes of CP, DOE, palpitations, edema, or fatigue.  Outpatient Medications Prior to Visit  Medication Sig Dispense Refill   atorvastatin (LIPITOR) 10 MG tablet TAKE 1 TABLET(10 MG) BY MOUTH DAILY 90 tablet 1   citalopram (CELEXA) 10 MG tablet Take 1 tablet (10 mg total) by mouth daily. 90 tablet 1   finasteride (PROSCAR) 5 MG tablet Take 1 tablet (5 mg total) by mouth daily. 90 tablet 1   fluticasone (FLONASE) 50 MCG/ACT nasal spray SHAKE LIQUID AND USE 2 SPRAYS IN EACH NOSTRIL DAILY 48 g 1   levocetirizine (XYZAL) 5 MG tablet TAKE 1 TABLET(5 MG) BY MOUTH EVERY EVENING 90 tablet 1   Vitamin D, Ergocalciferol, (DRISDOL) 1.25 MG (50000 UT) CAPS capsule Take 1 capsule by mouth once a week 12 capsule 0   omega-3 acid ethyl esters (LOVAZA) 1 g capsule Take 2 capsules by mouth twice daily 360 capsule 1   HYDROcodone-acetaminophen (NORCO/VICODIN) 5-325 MG tablet Take 1 tablet by mouth every 4 (four) hours as needed for moderate pain. 15 tablet 0   No facility-administered medications prior to visit.     ROS Review of Systems  Constitutional: Negative for chills, diaphoresis, fatigue and fever.  HENT: Negative.   Eyes: Negative for visual disturbance.  Respiratory: Negative for cough,  chest tightness, shortness of breath and wheezing.   Cardiovascular: Negative for chest pain, palpitations and leg swelling.  Gastrointestinal: Negative for abdominal pain, diarrhea, nausea and vomiting.  Endocrine: Negative.   Genitourinary: Negative.  Negative for difficulty urinating, scrotal swelling and testicular pain.  Musculoskeletal: Negative.  Negative for arthralgias and myalgias.  Skin: Negative.  Negative for color change and pallor.  Neurological: Negative.  Negative for dizziness, weakness, light-headedness and headaches.  Hematological: Negative for adenopathy. Does not bruise/bleed easily.  Psychiatric/Behavioral: Negative.     Objective:  BP 116/82 (BP Location: Left Arm, Patient Position: Sitting, Cuff Size: Normal)    Pulse 60    Temp 98 F (36.7 C) (Oral)    Resp 16    Ht 5' 11"  (1.803 m)    Wt 176 lb (79.8 kg)    SpO2 98%    BMI 24.55 kg/m   BP Readings from Last 3 Encounters:  11/20/19 116/82  06/13/19 132/72  05/17/19 130/90    Wt Readings from Last 3 Encounters:  11/20/19 176 lb (79.8 kg)  06/13/19 179 lb (81.2 kg)  05/17/19 178 lb (80.7 kg)    Physical Exam Vitals signs reviewed.  Constitutional:      Appearance: Normal appearance.  HENT:     Nose: Nose normal.     Mouth/Throat:     Mouth: Mucous membranes are moist.  Eyes:     General: No scleral icterus.    Conjunctiva/sclera: Conjunctivae normal.  Neck:  Musculoskeletal: Neck supple.  Cardiovascular:     Rate and Rhythm: Normal rate and regular rhythm.  Pulmonary:     Effort: Pulmonary effort is normal.     Breath sounds: No stridor. No wheezing, rhonchi or rales.  Abdominal:     General: Abdomen is flat. Bowel sounds are normal. There is no distension.     Palpations: Abdomen is soft. There is no hepatomegaly or splenomegaly.     Tenderness: There is no abdominal tenderness.  Genitourinary:    Pubic Area: No rash.      Penis: Normal and circumcised. No discharge, swelling or  lesions.      Scrotum/Testes: Normal.        Right: Mass or tenderness not present.        Left: Mass or tenderness not present.     Epididymis:     Right: Normal. Not inflamed or enlarged. No mass.     Left: Not inflamed or enlarged. No mass.     Prostate: Normal. Not enlarged, not tender and no nodules present.     Rectum: Normal. Guaiac result negative. No mass, tenderness, anal fissure, external hemorrhoid or internal hemorrhoid. Normal anal tone.  Musculoskeletal: Normal range of motion.     Right lower leg: No edema.     Left lower leg: No edema.  Lymphadenopathy:     Cervical: No cervical adenopathy.  Skin:    General: Skin is warm and dry.  Neurological:     General: No focal deficit present.     Mental Status: He is alert.  Psychiatric:        Mood and Affect: Mood normal.        Behavior: Behavior normal.     Lab Results  Component Value Date   WBC 5.5 11/20/2019   HGB 13.9 11/20/2019   HCT 40.4 11/20/2019   PLT 223.0 11/20/2019   GLUCOSE 92 11/20/2019   CHOL 177 11/20/2019   TRIG 171.0 (H) 11/20/2019   HDL 49.50 11/20/2019   LDLDIRECT 80.0 11/17/2018   LDLCALC 93 11/20/2019   ALT 21 11/20/2019   AST 22 11/20/2019   NA 135 11/20/2019   K 4.2 11/20/2019   CL 99 11/20/2019   CREATININE 1.18 11/20/2019   BUN 16 11/20/2019   CO2 26 11/20/2019   TSH 2.28 11/20/2019   PSA 0.16 11/20/2019    Dg Finger Index Right  Result Date: 05/17/2019 CLINICAL DATA:  Pain and swelling involving the index fingers since this past Sunday. No known injury. EXAM: RIGHT INDEX FINGER 2+V COMPARISON:  None. FINDINGS: Soft tissue swelling about the dorsal aspect of the DIP joint of the index finger. No associated fracture or radiopaque foreign body. Joint spaces appear preserved. No erosions. No discrete areas of osteolysis to suggest osteomyelitis. IMPRESSION: Nonspecific swelling about the soft tissues dorsal to the DIP joint of the index finger without associated fracture,  radiopaque foreign body or radiographic evidence of osteomyelitis. Electronically Signed   By: Sandi Mariscal M.D.   On: 05/17/2019 16:09    Assessment & Plan:   Lerone was seen today for annual exam, hypertension and hyperlipidemia.  Diagnoses and all orders for this visit:  Essential hypertension - His blood pressure is adequately well controlled with lifestyle modifications.  His labs are negative for secondary causes or endorgan damage. -     CBC with Differential; Future -     TSH; Future -     Urinalysis, Routine w reflex microscopic; Future -  Hepatic function panel; Future -     Basic metabolic panel; Future -     Vitamin D 25 hydroxy; Future  Routine health maintenance- Exam completed, labs reviewed, vaccines reviewed and updated, cancer screenings are up-to-date, patient education was given. -     Lipid panel; Future -     PSA; Future -     HIV antibody (Reflex); Future  Need for MMR vaccine -     MMR vaccine subcutaneous  Pure hyperglyceridemia- Improvement noted.  Will continue the omega-3 fish oil supplement. -     omega-3 acid ethyl esters (LOVAZA) 1 g capsule; Take 2 capsules (2 g total) by mouth 2 (two) times daily.   I have discontinued Jone Baseman HYDROcodone-acetaminophen. I have also changed his omega-3 acid ethyl esters. Additionally, I am having him maintain his citalopram, levocetirizine, fluticasone, atorvastatin, finasteride, and Vitamin D (Ergocalciferol).  Meds ordered this encounter  Medications   omega-3 acid ethyl esters (LOVAZA) 1 g capsule    Sig: Take 2 capsules (2 g total) by mouth 2 (two) times daily.    Dispense:  360 capsule    Refill:  1     Follow-up: Return in about 1 year (around 11/19/2020).  Scarlette Calico, MD

## 2019-11-21 ENCOUNTER — Encounter: Payer: Self-pay | Admitting: Internal Medicine

## 2019-11-21 LAB — HIV ANTIBODY (ROUTINE TESTING W REFLEX): HIV 1&2 Ab, 4th Generation: NONREACTIVE

## 2019-11-21 MED ORDER — OMEGA-3-ACID ETHYL ESTERS 1 G PO CAPS: 2.0000 | ORAL_CAPSULE | Freq: Two times a day (BID) | ORAL | 1 refills | Status: DC

## 2019-11-30 ENCOUNTER — Other Ambulatory Visit: Payer: Self-pay | Admitting: Internal Medicine

## 2019-11-30 DIAGNOSIS — E782 Mixed hyperlipidemia: Secondary | ICD-10-CM

## 2019-11-30 MED ORDER — ATORVASTATIN CALCIUM 10 MG PO TABS: ORAL_TABLET | ORAL | 1 refills | Status: DC

## 2019-12-04 ENCOUNTER — Encounter: Payer: Self-pay | Admitting: Internal Medicine

## 2019-12-05 ENCOUNTER — Other Ambulatory Visit: Payer: Self-pay | Admitting: Internal Medicine

## 2019-12-05 DIAGNOSIS — J301 Allergic rhinitis due to pollen: Secondary | ICD-10-CM

## 2019-12-05 MED ORDER — LEVOCETIRIZINE DIHYDROCHLORIDE 5 MG PO TABS: ORAL_TABLET | ORAL | 1 refills | Status: DC

## 2020-01-01 ENCOUNTER — Encounter: Payer: Self-pay | Admitting: Internal Medicine

## 2020-01-09 ENCOUNTER — Other Ambulatory Visit: Payer: Self-pay | Admitting: Family Medicine

## 2020-01-09 ENCOUNTER — Other Ambulatory Visit: Payer: Self-pay | Admitting: Internal Medicine

## 2020-01-09 DIAGNOSIS — F329 Major depressive disorder, single episode, unspecified: Secondary | ICD-10-CM

## 2020-01-09 DIAGNOSIS — F32A Depression, unspecified: Secondary | ICD-10-CM

## 2020-02-27 ENCOUNTER — Encounter: Payer: Self-pay | Admitting: Internal Medicine

## 2020-03-17 ENCOUNTER — Encounter: Payer: Self-pay | Admitting: Internal Medicine

## 2020-03-17 DIAGNOSIS — E781 Pure hyperglyceridemia: Secondary | ICD-10-CM

## 2020-03-18 MED ORDER — OMEGA-3-ACID ETHYL ESTERS 1 G PO CAPS: 2.0000 | ORAL_CAPSULE | Freq: Two times a day (BID) | ORAL | 1 refills | Status: DC

## 2020-04-03 ENCOUNTER — Other Ambulatory Visit: Payer: Self-pay

## 2020-04-03 ENCOUNTER — Encounter: Payer: Self-pay | Admitting: Family Medicine

## 2020-04-03 MED ORDER — VITAMIN D (ERGOCALCIFEROL) 1.25 MG (50000 UNIT) PO CAPS: 50000.0000 [IU] | ORAL_CAPSULE | ORAL | 0 refills | Status: DC

## 2020-05-26 ENCOUNTER — Other Ambulatory Visit: Payer: Self-pay | Admitting: Internal Medicine

## 2020-05-26 DIAGNOSIS — J301 Allergic rhinitis due to pollen: Secondary | ICD-10-CM

## 2020-05-26 DIAGNOSIS — E782 Mixed hyperlipidemia: Secondary | ICD-10-CM

## 2020-06-03 ENCOUNTER — Encounter: Payer: Self-pay | Admitting: Internal Medicine

## 2020-06-10 ENCOUNTER — Other Ambulatory Visit: Payer: Self-pay

## 2020-06-10 ENCOUNTER — Encounter: Payer: Self-pay | Admitting: Internal Medicine

## 2020-06-10 ENCOUNTER — Ambulatory Visit (INDEPENDENT_AMBULATORY_CARE_PROVIDER_SITE_OTHER): Admitting: Internal Medicine

## 2020-06-10 VITALS — BP 138/72 | HR 63 | Temp 98.5°F | Ht 71.0 in | Wt 171.0 lb

## 2020-06-10 DIAGNOSIS — S30860A Insect bite (nonvenomous) of lower back and pelvis, initial encounter: Secondary | ICD-10-CM

## 2020-06-10 DIAGNOSIS — B9689 Other specified bacterial agents as the cause of diseases classified elsewhere: Secondary | ICD-10-CM | POA: Diagnosis not present

## 2020-06-10 DIAGNOSIS — J019 Acute sinusitis, unspecified: Secondary | ICD-10-CM

## 2020-06-10 DIAGNOSIS — W57XXXA Bitten or stung by nonvenomous insect and other nonvenomous arthropods, initial encounter: Secondary | ICD-10-CM

## 2020-06-10 MED ORDER — DOXYCYCLINE HYCLATE 100 MG PO TABS: 100.0000 mg | ORAL_TABLET | Freq: Two times a day (BID) | ORAL | 0 refills | Status: AC

## 2020-06-10 NOTE — Patient Instructions (Signed)

## 2020-06-10 NOTE — Progress Notes (Signed)
Subjective:  Patient ID: Angel Combs, male    DOB: 04-28-1966  Age: 54 y.o. MRN: [id number]  CC: Cough and URI  This visit occurred during the SARS-CoV-2 public health emergency.  Safety protocols were in place, including screening questions prior to the visit, additional usage of staff PPE, and extensive cleaning of exam room while observing appropriate contact time as indicated for disinfecting solutions.    HPI Angel Combs presents for concerns about URI sx's and a tick bite on his back.  For 10 days he has had a cough that is nonproductive.  It started with a sore throat but the sore throat has resolved.  He is now complaining of facial pain.  He is getting some purulent phlegm out of his nose.  He denies headache, nausea, vomiting, shortness of breath, fever, chills, night sweats.  He also sustained a tick bite in the middle of his back about 3 weeks ago.  He has a small red area that does not bother him much.  Outpatient Medications Prior to Visit  Medication Sig Dispense Refill  . atorvastatin (LIPITOR) 10 MG tablet TAKE 1 TABLET(10 MG) BY MOUTH DAILY 90 tablet 1  . citalopram (CELEXA) 10 MG tablet Take 1 tablet by mouth once daily 90 tablet 1  . finasteride (PROSCAR) 5 MG tablet Take 1 tablet (5 mg total) by mouth daily. 90 tablet 1  . fluticasone (FLONASE) 50 MCG/ACT nasal spray SHAKE LIQUID AND USE 2 SPRAYS IN EACH NOSTRIL DAILY 48 g 1  . levocetirizine (XYZAL) 5 MG tablet TAKE 1 TABLET(5 MG) BY MOUTH EVERY EVENING 90 tablet 1  . omega-3 acid ethyl esters (LOVAZA) 1 g capsule Take 2 capsules (2 g total) by mouth 2 (two) times daily. 360 capsule 1  . Vitamin D, Ergocalciferol, (DRISDOL) 1.25 MG (50000 UNIT) CAPS capsule Take 1 capsule (50,000 Units total) by mouth once a week. 12 capsule 0   No facility-administered medications prior to visit.    ROS Review of Systems  Constitutional: Negative for chills, diaphoresis, fatigue and fever.  HENT: Positive for rhinorrhea,  sinus pressure and sinus pain. Negative for congestion, postnasal drip, sore throat and trouble swallowing.   Eyes: Negative.   Respiratory: Positive for cough. Negative for chest tightness, shortness of breath and wheezing.   Cardiovascular: Negative for chest pain, palpitations and leg swelling.  Gastrointestinal: Negative for abdominal pain, constipation, diarrhea, nausea and vomiting.  Endocrine: Negative.   Genitourinary: Negative.  Negative for difficulty urinating and dysuria.  Musculoskeletal: Negative.  Negative for myalgias and neck pain.  Skin: Positive for color change and wound. Negative for rash.  Neurological: Negative.  Negative for dizziness, weakness, light-headedness and headaches.  Hematological: Negative for adenopathy. Does not bruise/bleed easily.  Psychiatric/Behavioral: Negative.     Objective:  BP 138/72 (BP Location: Left Arm, Patient Position: Sitting, Cuff Size: Normal)   Pulse 63   Temp 98.5 F (36.9 C) (Oral)   Ht 5\' 11"  (1.803 m)   Wt 171 lb (77.6 kg)   SpO2 98%   BMI 23.85 kg/m   BP Readings from Last 3 Encounters:  06/10/20 138/72  11/20/19 116/82  06/13/19 132/72    Wt Readings from Last 3 Encounters:  06/10/20 171 lb (77.6 kg)  11/20/19 176 lb (79.8 kg)  06/13/19 179 lb (81.2 kg)    Physical Exam Vitals reviewed.  Constitutional:      General: He is not in acute distress.    Appearance: Normal appearance. He is  not ill-appearing or toxic-appearing.  HENT:     Nose: Nose normal.     Mouth/Throat:     Mouth: Mucous membranes are moist.  Eyes:     General: No scleral icterus.    Conjunctiva/sclera: Conjunctivae normal.  Cardiovascular:     Rate and Rhythm: Normal rate and regular rhythm.     Heart sounds: No murmur heard.   Pulmonary:     Effort: Pulmonary effort is normal.     Breath sounds: No stridor. No wheezing, rhonchi or rales.  Abdominal:     General: Abdomen is flat.     Palpations: There is no mass.     Tenderness:  There is no abdominal tenderness. There is no guarding.  Musculoskeletal:        General: Normal range of motion.     Cervical back: Neck supple.     Right lower leg: No edema.     Left lower leg: No edema.  Lymphadenopathy:     Cervical: No cervical adenopathy.  Skin:    General: Skin is warm and dry.     Comments: There is an erythematous papule in the center of the back.  On the right side, adjacent to this, there is an erythematous excoriation.  There are no vesicles.  No pustules.  There is no induration, fluctuance, or streaking.  Neurological:     General: No focal deficit present.     Mental Status: He is alert.     Lab Results  Component Value Date   WBC 5.5 11/20/2019   HGB 13.9 11/20/2019   HCT 40.4 11/20/2019   PLT 223.0 11/20/2019   GLUCOSE 92 11/20/2019   CHOL 177 11/20/2019   TRIG 171.0 (H) 11/20/2019   HDL 49.50 11/20/2019   LDLDIRECT 80.0 11/17/2018   LDLCALC 93 11/20/2019   ALT 21 11/20/2019   AST 22 11/20/2019   NA 135 11/20/2019   K 4.2 11/20/2019   CL 99 11/20/2019   CREATININE 1.18 11/20/2019   BUN 16 11/20/2019   CO2 26 11/20/2019   TSH 2.28 11/20/2019   PSA 0.16 11/20/2019    DG Finger Index Right  Result Date: 05/17/2019 CLINICAL DATA:  Pain and swelling involving the index fingers since this past Sunday. No known injury. EXAM: RIGHT INDEX FINGER 2+V COMPARISON:  None. FINDINGS: Soft tissue swelling about the dorsal aspect of the DIP joint of the index finger. No associated fracture or radiopaque foreign body. Joint spaces appear preserved. No erosions. No discrete areas of osteolysis to suggest osteomyelitis. IMPRESSION: Nonspecific swelling about the soft tissues dorsal to the DIP joint of the index finger without associated fracture, radiopaque foreign body or radiographic evidence of osteomyelitis. Electronically Signed   By: Angel Combs M.D.   On: 05/17/2019 16:09    Assessment & Plan:   Angel Combs was seen today for cough and  uri.  Diagnoses and all orders for this visit:  Acute bacterial sinusitis -     doxycycline (VIBRA-TABS) 100 MG tablet; Take 1 tablet (100 mg total) by mouth 2 (two) times daily for 10 days.  Tick bite of back, initial encounter- Will prophylax against tickborne illness with doxycycline. -     doxycycline (VIBRA-TABS) 100 MG tablet; Take 1 tablet (100 mg total) by mouth 2 (two) times daily for 10 days.   I am having Merlene Pulling start on doxycycline. I am also having him maintain his fluticasone, finasteride, citalopram, omega-3 acid ethyl esters, Vitamin D (Ergocalciferol), levocetirizine, and atorvastatin.  Meds ordered this encounter  Medications  . doxycycline (VIBRA-TABS) 100 MG tablet    Sig: Take 1 tablet (100 mg total) by mouth 2 (two) times daily for 10 days.    Dispense:  20 tablet    Refill:  0     Follow-up: Return if symptoms worsen or fail to improve.  Sanda Linger, MD

## 2020-07-22 ENCOUNTER — Other Ambulatory Visit: Payer: Self-pay | Admitting: Family Medicine

## 2020-07-22 NOTE — Telephone Encounter (Signed)
Patient scheduled on Wednesday for Virtual Visit.

## 2020-07-24 ENCOUNTER — Ambulatory Visit (INDEPENDENT_AMBULATORY_CARE_PROVIDER_SITE_OTHER): Admitting: Family Medicine

## 2020-07-24 ENCOUNTER — Encounter: Payer: Self-pay | Admitting: Family Medicine

## 2020-07-24 DIAGNOSIS — S46012D Strain of muscle(s) and tendon(s) of the rotator cuff of left shoulder, subsequent encounter: Secondary | ICD-10-CM | POA: Diagnosis not present

## 2020-07-24 MED ORDER — VITAMIN D (ERGOCALCIFEROL) 1.25 MG (50000 UNIT) PO CAPS: 50000.0000 [IU] | ORAL_CAPSULE | ORAL | 0 refills | Status: DC

## 2020-07-24 NOTE — Progress Notes (Signed)
Virtual Visit via Video Note  I connected with Brendin Situ on 07/24/20 at  4:00 PM EDT by a video enabled telemedicine application and verified that I am speaking with the correct person using two identifiers.  Location: Patient: Patient is at home alone Provider: In office setting alone   I discussed the limitations of evaluation and management by telemedicine and the availability of in person appointments. The patient expressed understanding and agreed to proceed.  History of Present Illness: 54 year old male who has had multiple different musculoskeletal problems needing refill for vitamin D.  Has done well overall.  New vitamin D though last checked 8 months ago and was found to be 54.9.    Observations/Objective: Alert and oriented x3, appears comfortable.  Difficulty with virtual platform so switch to a phone call   Assessment and Plan: 54 year old gentleman with multiple musculoskeletal complaints that is well controlled right now and feels better when he is taking the vitamin D supplementation.  Patient is still within the normal range but at next recheck which would likely be due in November if of by 75 then 1 go back to over-the-counter.  Patient is in agreement with the plan and will follow up with me in 1 year or if any exacerbation of any of the other elements occur.   Follow Up Instructions: 1 year    I discussed the assessment and treatment plan with the patient. The patient was provided an opportunity to ask questions and all were answered. The patient agreed with the plan and demonstrated an understanding of the instructions.   The patient was advised to call back or seek an in-person evaluation if the symptoms worsen or if the condition fails to improve as anticipated.  I provided 5 minutes of non-face-to-face time during this encounter.   Judi Saa, DO

## 2020-08-23 ENCOUNTER — Encounter: Payer: Self-pay | Admitting: Internal Medicine

## 2020-08-26 ENCOUNTER — Other Ambulatory Visit: Payer: Self-pay | Admitting: Internal Medicine

## 2020-08-26 DIAGNOSIS — E782 Mixed hyperlipidemia: Secondary | ICD-10-CM

## 2020-08-26 DIAGNOSIS — J301 Allergic rhinitis due to pollen: Secondary | ICD-10-CM

## 2020-08-30 ENCOUNTER — Other Ambulatory Visit: Payer: Self-pay | Admitting: Internal Medicine

## 2020-08-30 DIAGNOSIS — H6992 Unspecified Eustachian tube disorder, left ear: Secondary | ICD-10-CM | POA: Insufficient documentation

## 2020-08-30 DIAGNOSIS — H6982 Other specified disorders of Eustachian tube, left ear: Secondary | ICD-10-CM

## 2020-08-30 MED ORDER — METHYLPREDNISOLONE 4 MG PO TBPK: ORAL_TABLET | ORAL | 0 refills | Status: DC

## 2020-10-24 ENCOUNTER — Encounter: Payer: Self-pay | Admitting: Internal Medicine

## 2020-10-24 ENCOUNTER — Other Ambulatory Visit: Payer: Self-pay | Admitting: Internal Medicine

## 2020-10-24 DIAGNOSIS — J301 Allergic rhinitis due to pollen: Secondary | ICD-10-CM

## 2020-10-24 MED ORDER — FLUTICASONE PROPIONATE 50 MCG/ACT NA SUSP: NASAL | 1 refills | Status: DC

## 2020-11-03 ENCOUNTER — Encounter: Payer: Self-pay | Admitting: Family Medicine

## 2020-11-04 ENCOUNTER — Other Ambulatory Visit: Payer: Self-pay

## 2020-11-04 MED ORDER — VITAMIN D (ERGOCALCIFEROL) 1.25 MG (50000 UNIT) PO CAPS: 50000.0000 [IU] | ORAL_CAPSULE | ORAL | 0 refills | Status: DC

## 2020-11-15 ENCOUNTER — Other Ambulatory Visit: Payer: Self-pay | Admitting: Internal Medicine

## 2020-11-15 ENCOUNTER — Encounter: Payer: Self-pay | Admitting: Internal Medicine

## 2020-11-15 DIAGNOSIS — E782 Mixed hyperlipidemia: Secondary | ICD-10-CM

## 2020-11-15 DIAGNOSIS — Z0000 Encounter for general adult medical examination without abnormal findings: Secondary | ICD-10-CM

## 2020-11-15 DIAGNOSIS — I10 Essential (primary) hypertension: Secondary | ICD-10-CM

## 2020-11-15 DIAGNOSIS — L659 Nonscarring hair loss, unspecified: Secondary | ICD-10-CM

## 2020-11-20 ENCOUNTER — Encounter: Admitting: Internal Medicine

## 2020-11-27 ENCOUNTER — Ambulatory Visit (INDEPENDENT_AMBULATORY_CARE_PROVIDER_SITE_OTHER): Admitting: Internal Medicine

## 2020-11-27 ENCOUNTER — Other Ambulatory Visit: Payer: Self-pay

## 2020-11-27 ENCOUNTER — Encounter: Payer: Self-pay | Admitting: Internal Medicine

## 2020-11-27 VITALS — BP 128/86 | HR 64 | Temp 98.2°F | Resp 16 | Ht 71.0 in | Wt 174.0 lb

## 2020-11-27 DIAGNOSIS — E781 Pure hyperglyceridemia: Secondary | ICD-10-CM | POA: Diagnosis not present

## 2020-11-27 DIAGNOSIS — E782 Mixed hyperlipidemia: Secondary | ICD-10-CM | POA: Diagnosis not present

## 2020-11-27 DIAGNOSIS — I10 Essential (primary) hypertension: Secondary | ICD-10-CM

## 2020-11-27 DIAGNOSIS — Z0000 Encounter for general adult medical examination without abnormal findings: Secondary | ICD-10-CM | POA: Diagnosis not present

## 2020-11-27 DIAGNOSIS — R197 Diarrhea, unspecified: Secondary | ICD-10-CM

## 2020-11-27 DIAGNOSIS — Z0001 Encounter for general adult medical examination with abnormal findings: Secondary | ICD-10-CM | POA: Insufficient documentation

## 2020-11-27 NOTE — Patient Instructions (Signed)

## 2020-11-27 NOTE — Progress Notes (Signed)
Subjective:  Patient ID: Angel Combs, male    DOB: 06/19/1966  Age: 54 y.o. MRN: [id number]  CC: Annual Exam, Hypertension, and Hyperlipidemia  This visit occurred during the SARS-CoV-2 public health emergency.  Safety protocols were in place, including screening questions prior to the visit, additional usage of staff PPE, and extensive cleaning of exam room while observing appropriate contact time as indicated for disinfecting solutions.    HPI Tytan Sandate presents for a CPX.  1.  He complains of chronic, intermittent diarrhea.  He thinks he is sensitive to gluten.  He denies abdominal pain, melena, bright red blood per rectum, loss of appetite, or weight loss.  In fact he complains of weight gain  2.  He is doing well on Celexa but has decided to take it every other day.  He says his mood and attitude are good.  3.  He is tolerating the statin well with no muscle or joint aches.  4.  He runs several miles a day and does not experience CP, DOE, palpitations, edema, or fatigue.  Outpatient Medications Prior to Visit  Medication Sig Dispense Refill  . atorvastatin (LIPITOR) 10 MG tablet TAKE 1 TABLET(10 MG) BY MOUTH DAILY 90 tablet 1  . citalopram (CELEXA) 10 MG tablet Take 1 tablet by mouth once daily 90 tablet 1  . finasteride (PROSCAR) 5 MG tablet Take 1 tablet (5 mg total) by mouth daily. 90 tablet 1  . fluticasone (FLONASE) 50 MCG/ACT nasal spray SHAKE LIQUID AND USE 2 SPRAYS IN EACH NOSTRIL DAILY 48 g 1  . levocetirizine (XYZAL) 5 MG tablet TAKE 1 TABLET(5 MG) BY MOUTH EVERY EVENING 90 tablet 1  . omega-3 acid ethyl esters (LOVAZA) 1 g capsule Take 2 capsules (2 g total) by mouth 2 (two) times daily. 360 capsule 1  . Vitamin D, Ergocalciferol, (DRISDOL) 1.25 MG (50000 UNIT) CAPS capsule Take 1 capsule (50,000 Units total) by mouth once a week. 12 capsule 0  . methylPREDNISolone (MEDROL DOSEPAK) 4 MG TBPK tablet TAKE AS DIRECTED 21 tablet 0   No facility-administered medications  prior to visit.    ROS Review of Systems  Constitutional: Negative.  Negative for appetite change, diaphoresis, fatigue, fever and unexpected weight change.  HENT: Negative.   Eyes: Negative.   Respiratory: Negative for cough, chest tightness and shortness of breath.   Cardiovascular: Negative for chest pain, palpitations and leg swelling.  Gastrointestinal: Positive for diarrhea. Negative for abdominal pain, blood in stool, nausea and vomiting.  Endocrine: Negative.   Genitourinary: Negative.  Negative for difficulty urinating, scrotal swelling and testicular pain.  Musculoskeletal: Negative for arthralgias and myalgias.  Skin: Negative.  Negative for color change.  Neurological: Negative.  Negative for dizziness, weakness, light-headedness, numbness and headaches.  Hematological: Negative for adenopathy. Does not bruise/bleed easily.  Psychiatric/Behavioral: Negative.     Objective:  BP 128/86   Pulse 64   Temp 98.2 F (36.8 C) (Oral)   Resp 16   Ht 5\' 11"  (1.803 m)   Wt 174 lb (78.9 kg)   SpO2 98%   BMI 24.27 kg/m   BP Readings from Last 3 Encounters:  11/27/20 128/86  06/10/20 138/72  11/20/19 116/82    Wt Readings from Last 3 Encounters:  11/27/20 174 lb (78.9 kg)  06/10/20 171 lb (77.6 kg)  11/20/19 176 lb (79.8 kg)    Physical Exam Vitals reviewed.  Constitutional:      Appearance: Normal appearance.  HENT:     Nose: Nose  normal.     Mouth/Throat:     Mouth: Mucous membranes are moist.  Eyes:     General: No scleral icterus.    Conjunctiva/sclera: Conjunctivae normal.  Cardiovascular:     Rate and Rhythm: Normal rate and regular rhythm.     Heart sounds: No murmur heard.   Pulmonary:     Effort: Pulmonary effort is normal.     Breath sounds: No stridor. No wheezing, rhonchi or rales.  Abdominal:     General: Abdomen is flat.     Palpations: There is no mass.     Tenderness: There is no abdominal tenderness. There is no guarding.     Hernia: No  hernia is present. There is no hernia in the left inguinal area or right inguinal area.  Genitourinary:    Pubic Area: No rash.      Penis: Normal and circumcised.      Testes: Normal.        Right: Mass or tenderness not present.        Left: Mass, tenderness or swelling not present.     Epididymis:     Right: Normal. Not inflamed or enlarged. No mass.     Left: Normal. Not inflamed or enlarged. No mass.     Prostate: Normal. Not enlarged, not tender and no nodules present.     Rectum: Normal. Guaiac result negative. No mass, tenderness, anal fissure, external hemorrhoid or internal hemorrhoid. Normal anal tone.  Musculoskeletal:        General: Normal range of motion.     Cervical back: Neck supple.     Right lower leg: No edema.     Left lower leg: No edema.  Lymphadenopathy:     Cervical: No cervical adenopathy.     Lower Body: No right inguinal adenopathy. No left inguinal adenopathy.  Skin:    General: Skin is warm and dry.     Coloration: Skin is not pale.  Neurological:     General: No focal deficit present.     Mental Status: He is alert.  Psychiatric:        Mood and Affect: Mood normal.        Behavior: Behavior normal.     Lab Results  Component Value Date   WBC 5.5 11/20/2019   HGB 13.9 11/20/2019   HCT 40.4 11/20/2019   PLT 223.0 11/20/2019   GLUCOSE 92 11/20/2019   CHOL 177 11/20/2019   TRIG 171.0 (H) 11/20/2019   HDL 49.50 11/20/2019   LDLDIRECT 80.0 11/17/2018   LDLCALC 93 11/20/2019   ALT 21 11/20/2019   AST 22 11/20/2019   NA 135 11/20/2019   K 4.2 11/20/2019   CL 99 11/20/2019   CREATININE 1.18 11/20/2019   BUN 16 11/20/2019   CO2 26 11/20/2019   TSH 2.28 11/20/2019   PSA 0.16 11/20/2019    DG Finger Index Right  Result Date: 05/17/2019 CLINICAL DATA:  Pain and swelling involving the index fingers since this past Sunday. No known injury. EXAM: RIGHT INDEX FINGER 2+V COMPARISON:  None. FINDINGS: Soft tissue swelling about the dorsal aspect  of the DIP joint of the index finger. No associated fracture or radiopaque foreign body. Joint spaces appear preserved. No erosions. No discrete areas of osteolysis to suggest osteomyelitis. IMPRESSION: Nonspecific swelling about the soft tissues dorsal to the DIP joint of the index finger without associated fracture, radiopaque foreign body or radiographic evidence of osteomyelitis. Electronically Signed   By: Simonne Come  M.D.   On: 05/17/2019 16:09    Assessment & Plan:   Jarmel was seen today for annual exam, hypertension and hyperlipidemia.  Diagnoses and all orders for this visit:  Encounter for general adult medical examination with abnormal findings- Exam completed, labs reviewed, vaccines reviewed and updated, cancer screenings are up-to-date, patient education was given.  Essential hypertension- His blood pressure is adequately well controlled with lifestyle modifications.  Elevated cholesterol with elevated triglycerides- Will check a fasting lipid panel to see if he is achieved his LDL goal.  Pure hyperglyceridemia- Will treat the triglycerides if they are greater than 500.  Diarrhea, unspecified type- Recheck his celiac panel to see if he is having a flareup of celiac disease. -     Gliadin antibodies, serum; Future -     Tissue transglutaminase, IgA; Future -     Reticulin Antibody, IgA w reflex titer; Future   I have discontinued Reyansh Eichel's methylPREDNISolone. I am also having him maintain his finasteride, citalopram, omega-3 acid ethyl esters, atorvastatin, levocetirizine, fluticasone, and Vitamin D (Ergocalciferol).  No orders of the defined types were placed in this encounter.    Follow-up: Return in about 6 months (around 05/28/2021).  Sanda Linger, MD

## 2020-12-10 DIAGNOSIS — R197 Diarrhea, unspecified: Secondary | ICD-10-CM | POA: Insufficient documentation

## 2020-12-13 ENCOUNTER — Other Ambulatory Visit: Payer: Self-pay | Admitting: Internal Medicine

## 2020-12-13 DIAGNOSIS — F32A Depression, unspecified: Secondary | ICD-10-CM

## 2021-01-09 ENCOUNTER — Encounter: Payer: Self-pay | Admitting: Internal Medicine

## 2021-01-09 ENCOUNTER — Telehealth: Payer: Self-pay | Admitting: Internal Medicine

## 2021-01-09 NOTE — Telephone Encounter (Signed)
Error

## 2021-01-10 ENCOUNTER — Encounter: Payer: Self-pay | Admitting: Internal Medicine

## 2021-01-13 ENCOUNTER — Encounter: Payer: Self-pay | Admitting: Internal Medicine

## 2021-02-04 ENCOUNTER — Encounter: Payer: Self-pay | Admitting: Internal Medicine

## 2021-02-04 DIAGNOSIS — F32A Depression, unspecified: Secondary | ICD-10-CM

## 2021-02-04 DIAGNOSIS — J301 Allergic rhinitis due to pollen: Secondary | ICD-10-CM

## 2021-02-04 DIAGNOSIS — L659 Nonscarring hair loss, unspecified: Secondary | ICD-10-CM

## 2021-02-04 DIAGNOSIS — E781 Pure hyperglyceridemia: Secondary | ICD-10-CM

## 2021-02-04 DIAGNOSIS — E782 Mixed hyperlipidemia: Secondary | ICD-10-CM

## 2021-02-13 ENCOUNTER — Other Ambulatory Visit: Payer: Self-pay | Admitting: Family Medicine

## 2021-02-13 ENCOUNTER — Other Ambulatory Visit (INDEPENDENT_AMBULATORY_CARE_PROVIDER_SITE_OTHER)

## 2021-02-13 DIAGNOSIS — E782 Mixed hyperlipidemia: Secondary | ICD-10-CM

## 2021-02-13 DIAGNOSIS — R197 Diarrhea, unspecified: Secondary | ICD-10-CM

## 2021-02-13 DIAGNOSIS — I10 Essential (primary) hypertension: Secondary | ICD-10-CM | POA: Diagnosis not present

## 2021-02-13 DIAGNOSIS — Z0000 Encounter for general adult medical examination without abnormal findings: Secondary | ICD-10-CM

## 2021-02-13 LAB — URINALYSIS, ROUTINE W REFLEX MICROSCOPIC
Bilirubin Urine: NEGATIVE
Hgb urine dipstick: NEGATIVE
Ketones, ur: NEGATIVE
Leukocytes,Ua: NEGATIVE
Nitrite: NEGATIVE
RBC / HPF: NONE SEEN (ref 0–?)
Specific Gravity, Urine: 1.01 (ref 1.000–1.030)
Total Protein, Urine: NEGATIVE
Urine Glucose: NEGATIVE
Urobilinogen, UA: 0.2 (ref 0.0–1.0)
WBC, UA: NONE SEEN (ref 0–?)
pH: 6 (ref 5.0–8.0)

## 2021-02-13 LAB — PSA: PSA: 0.14 ng/mL (ref 0.10–4.00)

## 2021-02-13 LAB — HEPATIC FUNCTION PANEL
ALT: 22 U/L (ref 0–53)
AST: 19 U/L (ref 0–37)
Albumin: 4.4 g/dL (ref 3.5–5.2)
Alkaline Phosphatase: 53 U/L (ref 39–117)
Bilirubin, Direct: 0.1 mg/dL (ref 0.0–0.3)
Total Bilirubin: 0.5 mg/dL (ref 0.2–1.2)
Total Protein: 7.4 g/dL (ref 6.0–8.3)

## 2021-02-13 LAB — CBC WITH DIFFERENTIAL/PLATELET
Basophils Absolute: 0 10*3/uL (ref 0.0–0.1)
Basophils Relative: 0.7 % (ref 0.0–3.0)
Eosinophils Absolute: 0.1 10*3/uL (ref 0.0–0.7)
Eosinophils Relative: 2.5 % (ref 0.0–5.0)
HCT: 42.9 % (ref 39.0–52.0)
Hemoglobin: 14.6 g/dL (ref 13.0–17.0)
Lymphocytes Relative: 37.7 % (ref 12.0–46.0)
Lymphs Abs: 2 10*3/uL (ref 0.7–4.0)
MCHC: 34 g/dL (ref 30.0–36.0)
MCV: 92.3 fl (ref 78.0–100.0)
Monocytes Absolute: 0.5 10*3/uL (ref 0.1–1.0)
Monocytes Relative: 9.4 % (ref 3.0–12.0)
Neutro Abs: 2.7 10*3/uL (ref 1.4–7.7)
Neutrophils Relative %: 49.7 % (ref 43.0–77.0)
Platelets: 230 10*3/uL (ref 150.0–400.0)
RBC: 4.65 Mil/uL (ref 4.22–5.81)
RDW: 12.8 % (ref 11.5–15.5)
WBC: 5.4 10*3/uL (ref 4.0–10.5)

## 2021-02-13 LAB — BASIC METABOLIC PANEL
BUN: 17 mg/dL (ref 6–23)
CO2: 28 mEq/L (ref 19–32)
Calcium: 9.5 mg/dL (ref 8.4–10.5)
Chloride: 98 mEq/L (ref 96–112)
Creatinine, Ser: 1.36 mg/dL (ref 0.40–1.50)
GFR: 59.66 mL/min — ABNORMAL LOW (ref >60.00)
Glucose, Bld: 83 mg/dL (ref 70–99)
Potassium: 4.4 mEq/L (ref 3.5–5.1)
Sodium: 133 mEq/L — ABNORMAL LOW (ref 135–145)

## 2021-02-13 LAB — LIPID PANEL
Cholesterol: 271 mg/dL — ABNORMAL HIGH (ref 0–200)
HDL: 53 mg/dL (ref >39.00)
NonHDL: 217.86
Total CHOL/HDL Ratio: 5
Triglycerides: 287 mg/dL — ABNORMAL HIGH (ref 0.0–149.0)
VLDL: 57.4 mg/dL — ABNORMAL HIGH (ref 0.0–40.0)

## 2021-02-13 LAB — TSH: TSH: 2.24 u[IU]/mL (ref 0.35–4.50)

## 2021-02-13 LAB — LDL CHOLESTEROL, DIRECT: Direct LDL: 75 mg/dL

## 2021-02-13 MED ORDER — CITALOPRAM HYDROBROMIDE 10 MG PO TABS: 10.0000 mg | ORAL_TABLET | Freq: Every day | ORAL | 2 refills | Status: DC

## 2021-02-13 MED ORDER — OMEGA-3-ACID ETHYL ESTERS 1 G PO CAPS: 2.0000 | ORAL_CAPSULE | Freq: Two times a day (BID) | ORAL | 1 refills | Status: DC

## 2021-02-13 MED ORDER — FINASTERIDE 5 MG PO TABS: 5.0000 mg | ORAL_TABLET | Freq: Every day | ORAL | 1 refills | Status: DC

## 2021-02-13 MED ORDER — ATORVASTATIN CALCIUM 10 MG PO TABS: ORAL_TABLET | ORAL | 1 refills | Status: DC

## 2021-02-13 MED ORDER — VITAMIN D (ERGOCALCIFEROL) 1.25 MG (50000 UNIT) PO CAPS: 50000.0000 [IU] | ORAL_CAPSULE | ORAL | 0 refills | Status: DC

## 2021-02-13 MED ORDER — LEVOCETIRIZINE DIHYDROCHLORIDE 5 MG PO TABS: ORAL_TABLET | ORAL | 1 refills | Status: DC

## 2021-02-13 MED ORDER — FLUTICASONE PROPIONATE 50 MCG/ACT NA SUSP: NASAL | 1 refills | Status: DC

## 2021-02-13 NOTE — Telephone Encounter (Signed)
Done

## 2021-02-13 NOTE — Telephone Encounter (Signed)
Pt called requesting all his medications to be refilled. [facility] [address]. Thanks.

## 2021-02-17 LAB — TISSUE TRANSGLUTAMINASE, IGA: (tTG) Ab, IgA: <1 U/mL

## 2021-02-17 LAB — GLIADIN ANTIBODIES, SERUM
Gliadin IgA: <1 U/mL
Gliadin IgG: <1 U/mL

## 2021-02-17 LAB — RETICULIN ANTIBODIES, IGA W TITER: Reticulin IgA Screen: NEGATIVE

## 2021-04-06 ENCOUNTER — Encounter: Payer: Self-pay | Admitting: Family Medicine

## 2021-04-08 ENCOUNTER — Encounter: Payer: Self-pay | Admitting: Family Medicine

## 2021-04-08 ENCOUNTER — Ambulatory Visit (INDEPENDENT_AMBULATORY_CARE_PROVIDER_SITE_OTHER)

## 2021-04-08 ENCOUNTER — Other Ambulatory Visit: Payer: Self-pay

## 2021-04-08 ENCOUNTER — Ambulatory Visit (INDEPENDENT_AMBULATORY_CARE_PROVIDER_SITE_OTHER): Admitting: Family Medicine

## 2021-04-08 VITALS — BP 124/80 | HR 58 | Ht 71.0 in | Wt 178.0 lb

## 2021-04-08 DIAGNOSIS — M79642 Pain in left hand: Secondary | ICD-10-CM

## 2021-04-08 DIAGNOSIS — M20012 Mallet finger of left finger(s): Secondary | ICD-10-CM | POA: Diagnosis not present

## 2021-04-08 IMAGING — DX DG HAND COMPLETE 3+V*L*
3 series · 3 of 3 positions shown · non-contrast
Comparison: None.

CLINICAL DATA: Injury while playing football

EXAM:
LEFT HAND - COMPLETE 3+ VIEW

[hand ap]
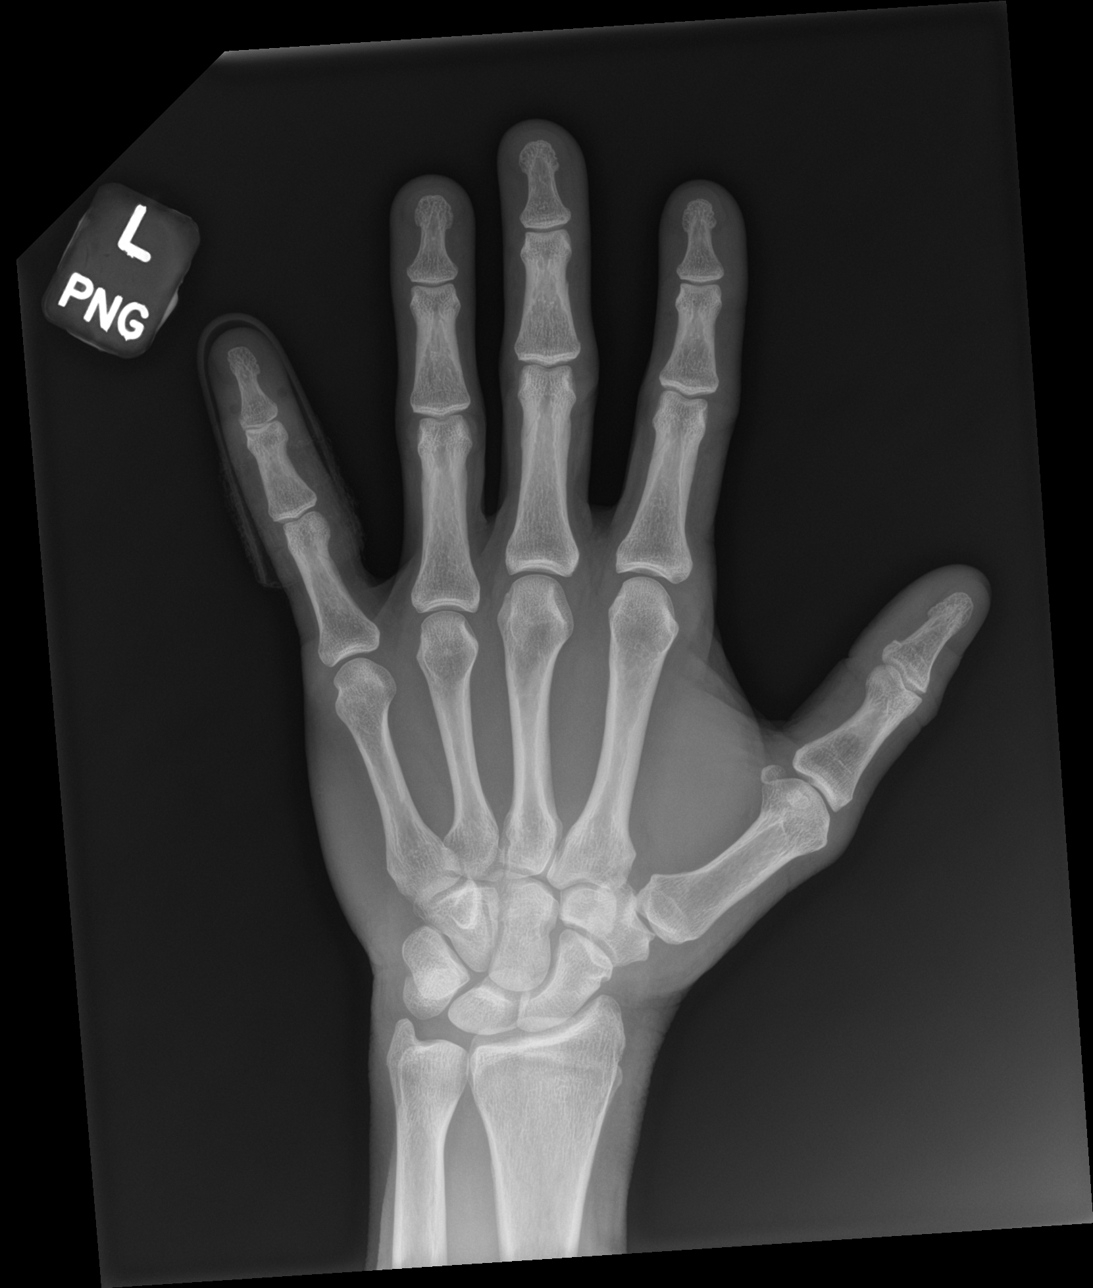

[hand obl]
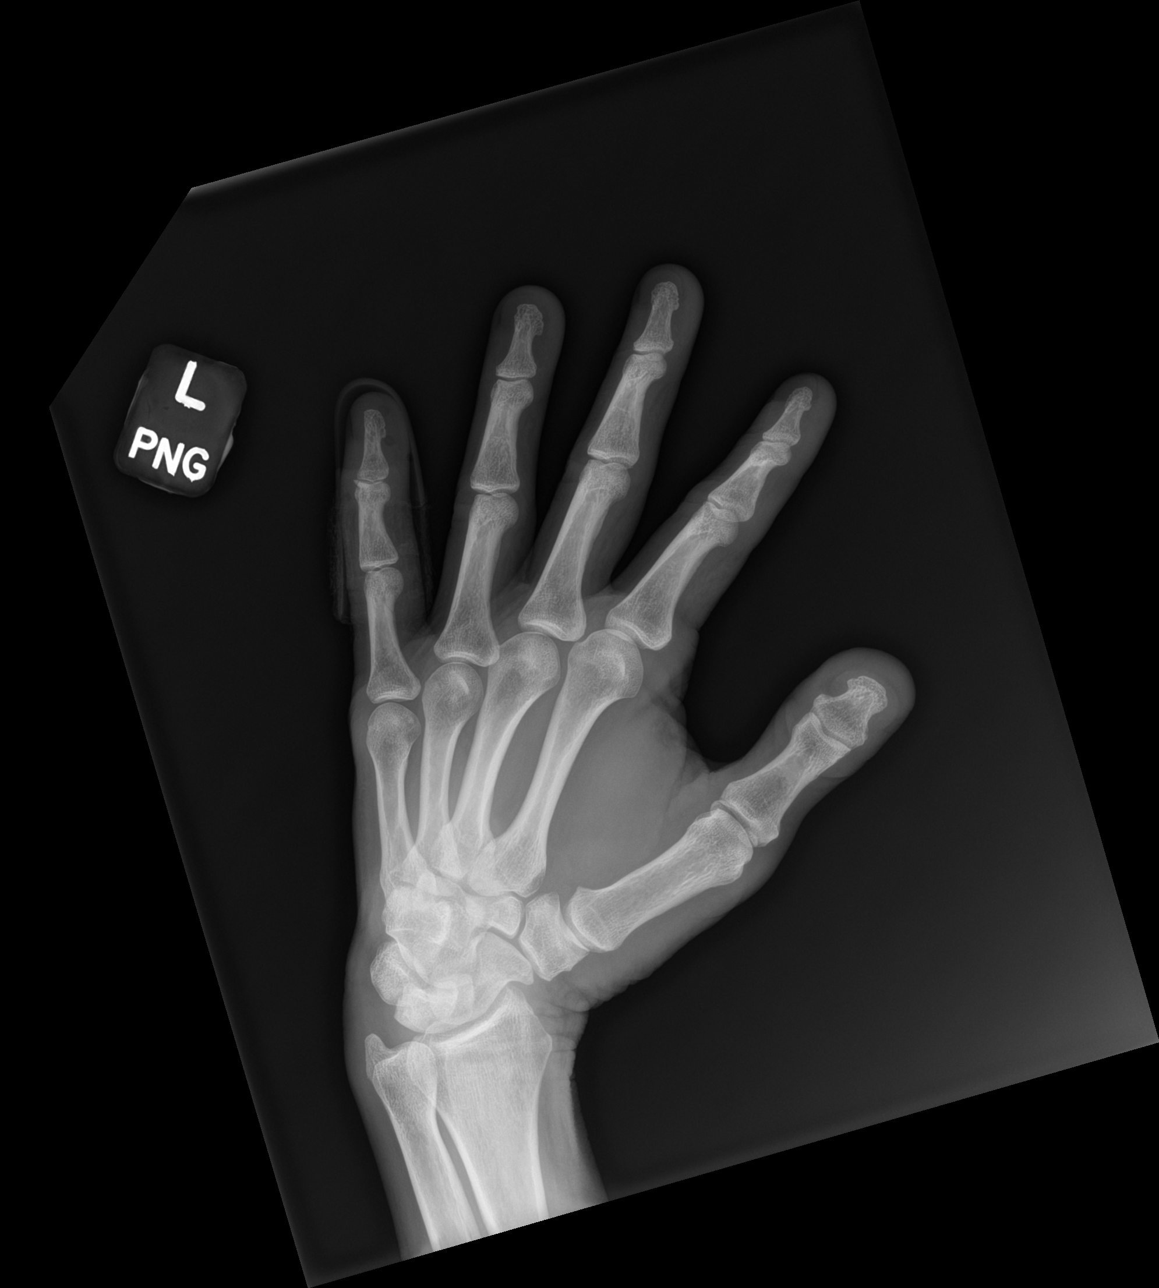

[hand lat]
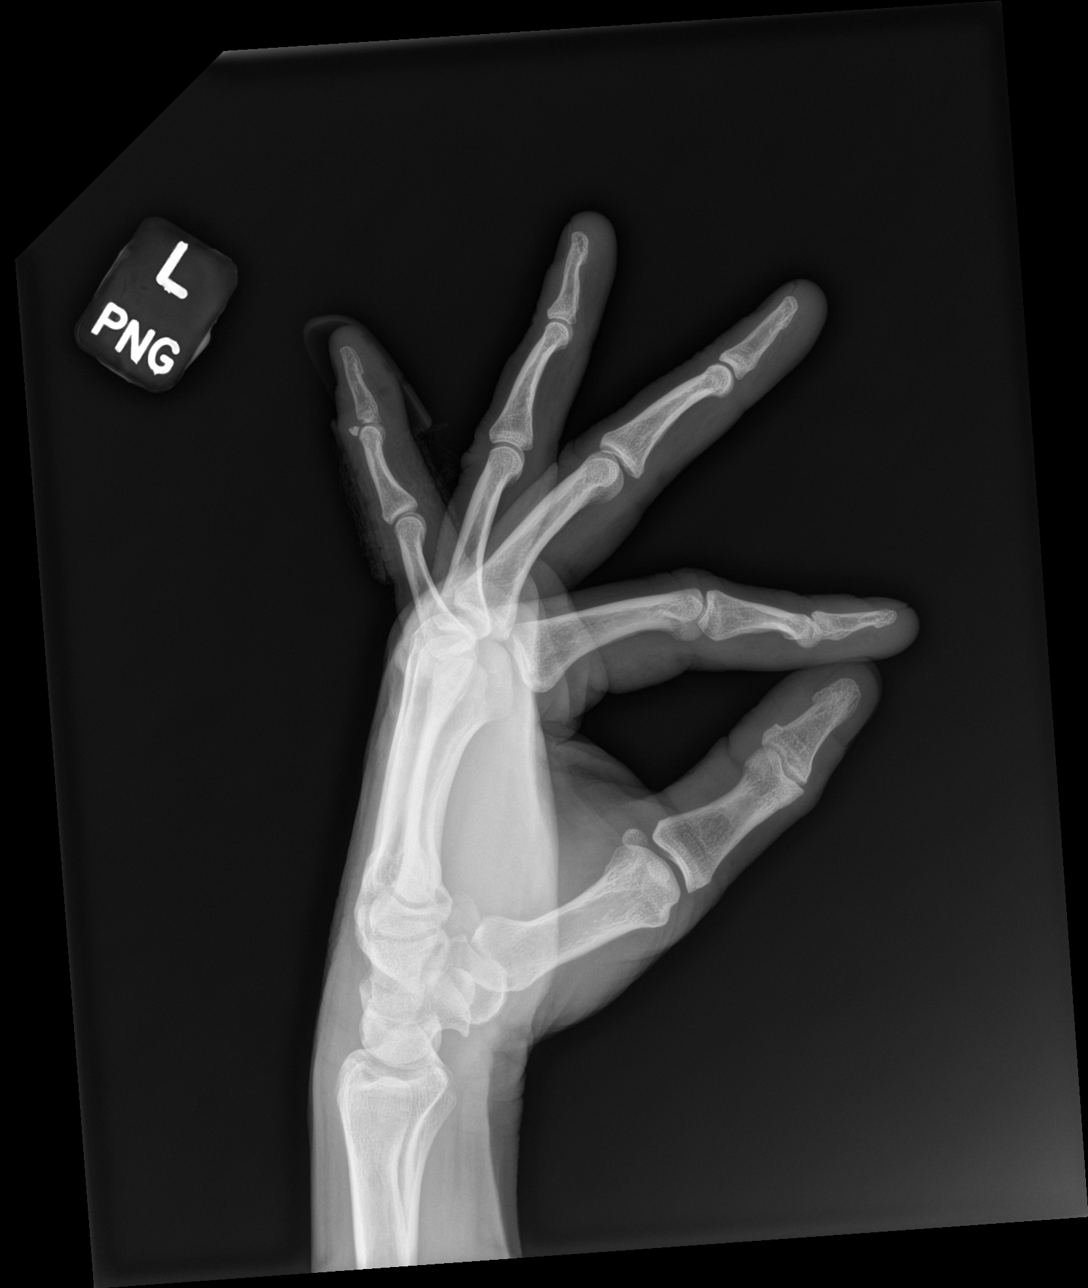

[3 of 3 positions shown; findings below may reference images not displayed]

FINDINGS: Frontal, oblique, and lateral views were obtained. There is evidence
of an avulsion arising from the dorsal proximal most aspect of the
fifth proximal phalanx. Avulsed fragment is within the dorsal aspect
of the fifth DIP joint. No other evident fracture. No dislocation.
Joint spaces appear normal. No erosive change. Immobilization device
overlies the fifth digit.
IMPRESSION: Avulsion along the dorsal proximal most aspect of the fifth distal
phalanx with avulsed fragment in the dorsal aspect of the fifth DIP
joint. No other fracture. No dislocation. No evident arthropathy.

These results will be called to the ordering clinician or
representative by the Radiologist Assistant, and communication
documented in the PACS or [REDACTED].

## 2021-04-08 NOTE — Patient Instructions (Addendum)
Xray L hand Try to keep finger in splint as much as possible for 5 weeks I'll read about PRP to see if that's a possibility See me in 5 weeks

## 2021-04-08 NOTE — Assessment & Plan Note (Signed)
Questionable fracture abnormality finger.  Patient has not had any type of treatment in the last 6 to 8 weeks when the injury did occur.  Patient given a stack splint today.  Discussed due to the longevity of this do need x-rays as well.  May not heal and may need either potential surgical intervention.  Also might be difficult.  Follow-up with me again 5 to 6 weeks.

## 2021-04-08 NOTE — Progress Notes (Signed)
[facility] [address] [place] [address] Phone: [phone] Subjective:   Angel Combs, am serving as a scribe for Dr. Antoine Primas. This visit occurred during the SARS-CoV-2 public health emergency.  Safety protocols were in place, including screening questions prior to the visit, additional usage of staff PPE, and extensive cleaning of exam room while observing appropriate contact time as indicated for disinfecting solutions.   I'm seeing this patient by the request  of:  Angel Grandchild, MD  CC: Left finger pain  EQA:[id number]  Angel Combs is a 55 y.o. male coming in with complaint of L hand pinky finger pain when he was throwing football with his son 2 months ago. Feels he jammed finger. DIP is stuck in flexion. Continued pain in finger.  Patient states that it only hurts when he touches it on certain things such as his pocket.  The patient denies any weakness but is unable to extend the tip of his finger.  Patient states continues to have swelling in the area.      Past Medical History:  Diagnosis Date  . ALLERGIC RHINITIS CAUSE UNSPECIFIED   . Anxiety    using celxa.  [name] LACTOSE INTOLERANCE   . Left shoulder pain July '13   had tried accupuncture.   . Pain of left heel   . PATELLO-FEMORAL SYNDROME    sees chiropractor with good results using exercises.   Past Surgical History:  Procedure Laterality Date  . lasik  Augu 22, 2013  . NOSE SURGERY  1991   Social History   Socioeconomic History  . Marital status: Single    Spouse name: Not on file  . Number of children: 0  . Years of education: 1  . Highest education level: Not on file  Occupational History  . Occupation: Real Chief Technology Officer  Tobacco Use  . Smoking status: Never Smoker  . Smokeless tobacco: Never Used  Substance and Sexual Activity  . Alcohol use: Yes    Alcohol/week: 15.0 standard drinks    Types: 15 drink(s) per week  . Drug use: No  . Sexual  activity: Yes    Partners: Female  Other Topics Concern  . Not on file  Social History Narrative   HS[employer], [employer]/ROTC. BS, MBA, MA-geography, MA foreign affairs  [place], [place]- MIS. 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 No regular exercise program.   Social Determinants of Health   Financial Resource Strain: Not on file  Food Insecurity: Not on file  Transportation Needs: Not on file  Physical Activity: Not on file  Stress: Not on file  Social Connections: Not on file   No Known Allergies Family History  Problem Relation Age of Onset  . Hyperlipidemia Father   . Hypertension Father   . Coronary artery disease Father   . Diabetes Maternal Grandmother   . Coronary artery disease Other   . Arthritis Mother        back pain  . Cancer Neg Hx   . Depression Neg Hx   . Early death Neg Hx   . Hearing loss Neg Hx   . Heart disease Neg Hx   . Kidney disease Neg Hx   . Stroke Neg Hx      Current Outpatient Medications (Cardiovascular):  .  atorvastatin (LIPITOR) 10 MG tablet, TAKE 1 TABLET(10 MG) BY MOUTH DAILY .  omega-3 acid ethyl esters (LOVAZA) 1 g capsule, Take 2 capsules (2 g total) by mouth 2 (two) times daily.  Current Outpatient Medications (Respiratory):  .  fluticasone (FLONASE) 50 MCG/ACT nasal spray, SHAKE LIQUID AND USE 2 SPRAYS IN EACH NOSTRIL DAILY .  levocetirizine (XYZAL) 5 MG tablet, TAKE 1 TABLET(5 MG) BY MOUTH EVERY EVENING    Current Outpatient Medications (Other):  .  citalopram (CELEXA) 10 MG tablet, Take 1 tablet (10 mg total) by mouth daily. .  finasteride (PROSCAR) 5 MG tablet, Take 1 tablet (5 mg total) by mouth daily. .  Vitamin D, Ergocalciferol, (DRISDOL) 1.25 MG (50000 UNIT) CAPS capsule, Take 1 capsule (50,000 Units total) by mouth once a week.   Reviewed prior external information including notes and imaging from  primary care provider As well  as notes that were available from care everywhere and other healthcare systems.  Past medical history, social, surgical and family history all reviewed in electronic medical record.  No pertanent information unless stated regarding to the chief complaint.   Review of Systems:  No headache, visual changes, nausea, vomiting, diarrhea, constipation, dizziness, abdominal pain, skin rash, fevers, chills, night sweats, weight loss, swollen lymph nodes, body aches, joint swelling, chest pain, shortness of breath, mood changes. POSITIVE muscle aches  Objective  Blood pressure 124/80, pulse (!) 58, height 5\' 11"  (1.803 m), weight 178 lb (80.7 kg), SpO2 98 %.   General: No apparent distress alert and oriented x3 mood and affect normal, dressed appropriately.  HEENT: Pupils equal, extraocular movements intact  Respiratory: Patient's speak in full sentences and does not appear short of breath  Cardiovascular: No lower extremity edema, non tender, no erythema  Gait normal with good balance and coordination.  MSK:  Left hand exam shows the patient does have a flexed positioning of the DIP of the left hand.  Does have some swelling over the DIP with some very mild bruising noted.  Patient able to have any active extension of this finger at the DIP.  Patient does have full flexion no noted.  Neurovascularly intact distally.     Impression and Recommendations:     The above documentation has been reviewed and is accurate and complete , DO

## 2021-04-09 ENCOUNTER — Telehealth: Payer: Self-pay | Admitting: Family Medicine

## 2021-04-09 NOTE — Telephone Encounter (Signed)
Noted and sent a message in my chart to patient

## 2021-04-09 NOTE — Telephone Encounter (Signed)
[facility] called with a call report on the left hand xray.  FINDINGS: Frontal, oblique, and lateral views were obtained. There is evidence of an avulsion arising from the dorsal proximal most aspect of the fifth proximal phalanx. Avulsed fragment is within the dorsal aspect of the fifth DIP joint. No other evident fracture. No dislocation. Joint spaces appear normal. No erosive change. Immobilization device overlies the fifth digit.  IMPRESSION: Avulsion along the dorsal proximal most aspect of the fifth distal phalanx with avulsed fragment in the dorsal aspect of the fifth DIP joint. No other fracture. No dislocation. No evident arthropathy.

## 2021-04-30 ENCOUNTER — Other Ambulatory Visit: Payer: Self-pay | Admitting: Family Medicine

## 2021-05-12 ENCOUNTER — Encounter: Payer: Self-pay | Admitting: Internal Medicine

## 2021-05-14 ENCOUNTER — Encounter: Payer: Self-pay | Admitting: Family Medicine

## 2021-05-14 NOTE — Telephone Encounter (Signed)
Added to cancellation list 

## 2021-05-15 NOTE — Progress Notes (Signed)
[name] [address] [place] [address] Phone: [phone] Subjective:   [name], am serving as a scribe for Dr. Antoine Primas. This visit occurred during the SARS-CoV-2 public health emergency.  Safety protocols were in place, including screening questions prior to the visit, additional usage of staff PPE, and extensive cleaning of exam room while observing appropriate contact time as indicated for disinfecting solutions.   I'm seeing this patient by the request  of:  Etta Grandchild, MD  CC: Finger pain follow-up,  ESP:[id number]   04/08/2021 Questionable fracture abnormality finger.  Patient has not had any type of treatment in the last 6 to 8 weeks when the injury did occur.  Patient given a stack splint today.  Discussed due to the longevity of this do need x-rays as well.  May not heal and may need either potential surgical intervention.  Also might be difficult.  Follow-up with me again 5 to 6 weeks.  Update 05/16/2021 Angel Combs is a 55 y.o. male coming in with complaint of L hand mallet finger. No pain in finger. Has kept splint on since last visit.  Patient states that he is not having any significant pain at this time.  Feels like he is back may be better.  Has not been trying to move it much.  Continues to be active otherwise.       Past Medical History:  Diagnosis Date  . ALLERGIC RHINITIS CAUSE UNSPECIFIED   . Anxiety    using celxa.  [name] LACTOSE INTOLERANCE   . Left shoulder pain July '13   had tried accupuncture.   . Pain of left heel   . PATELLO-FEMORAL SYNDROME    sees chiropractor with good results using exercises.   Past Surgical History:  Procedure Laterality Date  . lasik  Augu 22, 2013  . NOSE SURGERY  1991   Social History   Socioeconomic History  . Marital status: Single    Spouse name: Not on file  . Number of children: 0  . Years of education: 36  . Highest education level: Not on file   Occupational History  . Occupation: Real Chief Technology Officer  Tobacco Use  . Smoking status: Never Smoker  . Smokeless tobacco: Never Used  Substance and Sexual Activity  . Alcohol use: Yes    Alcohol/week: 15.0 standard drinks    Types: 15 drink(s) per week  . Drug use: No  . Sexual activity: Yes    Partners: Female  Other Topics Concern  . Not on file  Social History Narrative   HSG, [employer]/ROTC. BS, MBA, MA-geography, MA foreign affairs  [place], [place]- MIS. 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 No regular exercise program.   Social Determinants of Health   Financial Resource Strain: Not on file  Food Insecurity: Not on file  Transportation Needs: Not on file  Physical Activity: Not on file  Stress: Not on file  Social Connections: Not on file   No Known Allergies Family History  Problem Relation Age of Onset  . Hyperlipidemia Father   . Hypertension Father   . Coronary artery disease Father   . Diabetes Maternal Grandmother   . Coronary artery disease Other   . Arthritis Mother        back pain  . Cancer Neg Hx   . Depression Neg Hx   .  Early death Neg Hx   . Hearing loss Neg Hx   . Heart disease Neg Hx   . Kidney disease Neg Hx   . Stroke Neg Hx      Current Outpatient Medications (Cardiovascular):  .  atorvastatin (LIPITOR) 10 MG tablet, TAKE 1 TABLET(10 MG) BY MOUTH DAILY .  omega-3 acid ethyl esters (LOVAZA) 1 g capsule, Take 2 capsules (2 g total) by mouth 2 (two) times daily.  Current Outpatient Medications (Respiratory):  .  fluticasone (FLONASE) 50 MCG/ACT nasal spray, SHAKE LIQUID AND USE 2 SPRAYS IN EACH NOSTRIL DAILY .  levocetirizine (XYZAL) 5 MG tablet, TAKE 1 TABLET(5 MG) BY MOUTH EVERY EVENING    Current Outpatient Medications (Other):  .  citalopram (CELEXA) 10 MG tablet, Take 1 tablet (10 mg total) by mouth daily. .  finasteride (PROSCAR) 5 MG  tablet, Take 1 tablet (5 mg total) by mouth daily. .  Vitamin D, Ergocalciferol, (DRISDOL) 1.25 MG (50000 UNIT) CAPS capsule, TAKE 1 CAPSULE BY MOUTH ONE TIME PER WEEK   Reviewed prior external information including notes and imaging from  primary care provider As well as notes that were available from care everywhere and other healthcare systems.  Past medical history, social, surgical and family history all reviewed in electronic medical record.  No pertanent information unless stated regarding to the chief complaint.   Review of Systems:  No headache, visual changes, nausea, vomiting, diarrhea, constipation, dizziness, abdominal pain, skin rash, fevers, chills, night sweats, weight loss, swollen lymph nodes, body aches, joint swelling, chest pain, shortness of breath, mood changes. POSITIVE muscle aches  Objective  Blood pressure 112/68, pulse 75, height 5\' 11"  (1.803 m), weight 176 lb (79.8 kg), SpO2 98 %.   General: No apparent distress alert and oriented x3 mood and affect normal, dressed appropriately.  HEENT: Pupils equal, extraocular movements intact  Respiratory: Patient's speak in full sentences and does not appear short of breath  Cardiovascular: No lower extremity edema, non tender, no erythema  Gait normal with good balance and coordination.  MSK: Left hand exam shows the patient actually does still have very mild 5 degrees flexion of the DIP of the fifth finger but patient swelling is significantly better.  When isolated patient does have some extension up to lacking the last 2 degrees of extension of the DIP on the left side.  Neurovascularly intact.  Left shoulder shows some very mild limited range of motion.  Rotator cuff strength is 4+ out of 5 compared to the contralateral side with mild impingement noted.  Limited musculoskeletal ultrasound was performed and interpreted by  Limited ultrasound of patient's left DIP area shows the patient still has mild  avulsion noted over the proximal aspect of the fifth distal phalanx with mild callus formation noted. Impression: Very mild interval healing noted.   Impression and Recommendations:     The above documentation has been reviewed and is accurate and complete Judi Saa, DO

## 2021-05-16 ENCOUNTER — Ambulatory Visit: Payer: Self-pay

## 2021-05-16 ENCOUNTER — Other Ambulatory Visit: Payer: Self-pay

## 2021-05-16 ENCOUNTER — Ambulatory Visit (INDEPENDENT_AMBULATORY_CARE_PROVIDER_SITE_OTHER): Admitting: Family Medicine

## 2021-05-16 ENCOUNTER — Encounter: Payer: Self-pay | Admitting: Family Medicine

## 2021-05-16 VITALS — BP 112/68 | HR 75 | Ht 71.0 in | Wt 176.0 lb

## 2021-05-16 DIAGNOSIS — M20012 Mallet finger of left finger(s): Secondary | ICD-10-CM | POA: Diagnosis not present

## 2021-05-16 DIAGNOSIS — M25512 Pain in left shoulder: Secondary | ICD-10-CM | POA: Diagnosis not present

## 2021-05-16 DIAGNOSIS — M79642 Pain in left hand: Secondary | ICD-10-CM

## 2021-05-16 NOTE — Patient Instructions (Signed)
Congrats on the wedding Another 6 weeks in the splint Let me know if you want to do PRP  See me in 6 weeks

## 2021-05-16 NOTE — Assessment & Plan Note (Signed)
Patient has done fairly remarkably well with the rotator cuff tear.  Patient did do PRP greater than 3 years ago at this time and is wondering if he needs a repeat.  Discussed with him about possibly if further imaging.  Patient is considering noted just doing the PRP to see how patient responds.  We will discuss further at follow-up.

## 2021-05-16 NOTE — Assessment & Plan Note (Signed)
Patient has made some progress at this time.  Patient actually does have some extension noted which is improvement from previous exam.  Encourage patient to continue the potential bracing for another 6 weeks.  Patient was adamant he would like [place] to consider the possibility of PRP.  Discussed with him that the evidence for this problem and especially with the duration of his symptoms was not great quality but is likely a low concern for causing injury as well.  Patient wants to consider it.  We discussed we can do it but I do not think it is completely necessary.  Follow-up with me again 6 weeks

## 2021-05-20 ENCOUNTER — Ambulatory Visit: Admitting: Family Medicine

## 2021-05-23 ENCOUNTER — Other Ambulatory Visit: Payer: Self-pay

## 2021-05-27 ENCOUNTER — Encounter: Payer: Self-pay | Admitting: Internal Medicine

## 2021-05-27 ENCOUNTER — Other Ambulatory Visit: Payer: Self-pay

## 2021-05-27 ENCOUNTER — Ambulatory Visit: Admitting: Internal Medicine

## 2021-05-27 VITALS — BP 128/82 | HR 66 | Temp 97.9°F | Ht 71.0 in | Wt 176.0 lb

## 2021-05-27 DIAGNOSIS — E291 Testicular hypofunction: Secondary | ICD-10-CM

## 2021-05-27 DIAGNOSIS — I10 Essential (primary) hypertension: Secondary | ICD-10-CM | POA: Diagnosis not present

## 2021-05-27 DIAGNOSIS — R5383 Other fatigue: Secondary | ICD-10-CM | POA: Diagnosis not present

## 2021-05-27 DIAGNOSIS — E782 Mixed hyperlipidemia: Secondary | ICD-10-CM

## 2021-05-27 DIAGNOSIS — E781 Pure hyperglyceridemia: Secondary | ICD-10-CM | POA: Diagnosis not present

## 2021-05-27 DIAGNOSIS — E871 Hypo-osmolality and hyponatremia: Secondary | ICD-10-CM

## 2021-05-27 NOTE — Patient Instructions (Signed)
Coronary Calcium Scan A coronary calcium scan is an imaging test used to look for deposits of plaque in the inner lining of the blood vessels of the heart (coronary arteries). Plaque is made up of calcium, protein, and fatty substances. These deposits of plaque can partly clog and narrow the coronary arteries without producing any symptoms or warning signs. This puts a person at risk for a heart attack. This test is recommended for people who are at moderate risk for heart disease. The test can find plaque deposits before symptoms develop. Tell a health care provider about:  Any allergies you have.  All medicines you are taking, including vitamins, herbs, eye drops, creams, and over-the-counter medicines.  Any problems you or family members have had with anesthetic medicines.  Any blood disorders you have.  Any surgeries you have had.  Any medical conditions you have.  Whether you are pregnant or may be pregnant. What are the risks? Generally, this is a safe procedure. However, problems may occur, including:  Harm to a pregnant woman and her unborn baby. This test involves the use of radiation. Radiation exposure can be dangerous to a pregnant woman and her unborn baby. If you are pregnant or think you may be pregnant, you should not have this procedure done.  Slight increase in the risk of cancer. This is because of the radiation involved in the test. What happens before the procedure? Ask your health care provider for any specific instructions on how to prepare for this procedure. You may be asked to avoid products that contain caffeine, tobacco, or nicotine for 4 hours before the procedure. What happens during the procedure?  You will undress and remove any jewelry from your neck or chest.  You will put on a hospital gown.  Sticky electrodes will be placed on your chest. The electrodes will be connected to an electrocardiogram (ECG) machine to record a tracing of the electrical  activity of your heart.  You will lie down on a curved bed that is attached to the CT scanner.  You may be given medicine to slow down your heart rate so that clear pictures can be created.  You will be moved into the CT scanner, and the CT scanner will take pictures of your heart. During this time, you will be asked to lie still and hold your breath for 2-3 seconds at a time while each picture of your heart is being taken. The procedure may vary among health care providers and hospitals.   What happens after the procedure?  You can get dressed.  You can return to your normal activities.  It is up to you to get the results of your procedure. Ask your health care provider, or the department that is doing the procedure, when your results will be ready. Summary  A coronary calcium scan is an imaging test used to look for deposits of plaque in the inner lining of the blood vessels of the heart (coronary arteries). Plaque is made up of calcium, protein, and fatty substances.  Generally, this is a safe procedure. Tell your health care provider if you are pregnant or may be pregnant.  Ask your health care provider for any specific instructions on how to prepare for this procedure.  A CT scanner will take pictures of your heart.  You can return to your normal activities after the scan is done. This information is not intended to replace advice given to you by your health care provider. Make sure you discuss   any questions you have with your health care provider. Document Revised: 07/04/2019 Document Reviewed: 07/04/2019 Elsevier Patient Education  2021 Elsevier Inc.  

## 2021-05-27 NOTE — Progress Notes (Addendum)
Subjective:  Patient ID: Angel Combs, male    DOB: 01-23-1966  Age: 55 y.o. MRN: [id number]  CC: Hyperlipidemia  This visit occurred during the SARS-CoV-2 public health emergency.  Safety protocols were in place, including screening questions prior to the visit, additional usage of staff PPE, and extensive cleaning of exam room while observing appropriate contact time as indicated for disinfecting solutions.    HPI Angel Combs presents for f/up -   He complains of a 3 week hx of decreased endurance and fatigue after he runs about 1/2 mile.  He denies CP, diaphoresis, dizziness, lightheadedness, DOE, or SOB.  Outpatient Medications Prior to Visit  Medication Sig Dispense Refill  . atorvastatin (LIPITOR) 10 MG tablet TAKE 1 TABLET(10 MG) BY MOUTH DAILY 90 tablet 1  . citalopram (CELEXA) 10 MG tablet Take 1 tablet (10 mg total) by mouth daily. 90 tablet 2  . finasteride (PROSCAR) 5 MG tablet Take 1 tablet (5 mg total) by mouth daily. 90 tablet 1  . fluticasone (FLONASE) 50 MCG/ACT nasal spray SHAKE LIQUID AND USE 2 SPRAYS IN EACH NOSTRIL DAILY 48 g 1  . levocetirizine (XYZAL) 5 MG tablet TAKE 1 TABLET(5 MG) BY MOUTH EVERY EVENING 90 tablet 1  . omega-3 acid ethyl esters (LOVAZA) 1 g capsule Take 2 capsules (2 g total) by mouth 2 (two) times daily. 360 capsule 1  . Vitamin D, Ergocalciferol, (DRISDOL) 1.25 MG (50000 UNIT) CAPS capsule TAKE 1 CAPSULE BY MOUTH ONE TIME PER WEEK 12 capsule 0   No facility-administered medications prior to visit.    ROS Review of Systems  Constitutional: Positive for fatigue. Negative for appetite change, diaphoresis and unexpected weight change.  HENT: Negative.   Eyes: Negative.   Respiratory: Negative for cough, chest tightness, shortness of breath and wheezing.   Cardiovascular: Negative for chest pain, palpitations and leg swelling.  Gastrointestinal: Negative for abdominal pain, constipation, diarrhea, nausea and vomiting.  Endocrine: Negative.    Genitourinary: Negative.  Negative for difficulty urinating.  Musculoskeletal: Negative.  Negative for arthralgias and myalgias.  Skin: Negative.   Neurological: Negative.  Negative for dizziness, weakness, light-headedness and headaches.  Hematological: Negative for adenopathy. Does not bruise/bleed easily.  Psychiatric/Behavioral: Negative.     Objective:  BP 128/82 (BP Location: Left Arm, Patient Position: Sitting, Cuff Size: Large)   Pulse 66   Temp 97.9 F (36.6 C) (Oral)   Ht 5\' 11"  (1.803 m)   Wt 176 lb (79.8 kg)   SpO2 98%   BMI 24.55 kg/m   BP Readings from Last 3 Encounters:  05/27/21 128/82  05/16/21 112/68  04/08/21 124/80    Wt Readings from Last 3 Encounters:  05/27/21 176 lb (79.8 kg)  05/16/21 176 lb (79.8 kg)  04/08/21 178 lb (80.7 kg)    Physical Exam Vitals reviewed.  HENT:     Nose: Nose normal.     Mouth/Throat:     Mouth: Mucous membranes are moist.  Eyes:     General: No scleral icterus.    Conjunctiva/sclera: Conjunctivae normal.  Cardiovascular:     Rate and Rhythm: Normal rate and regular rhythm.     Heart sounds: S1 normal and S2 normal. No friction rub. No gallop. No S3 sounds.      Comments: EKG- Sinus bradycardia, 59 bpm TWI V1-V4 No Q waves Pulmonary:     Effort: Pulmonary effort is normal.     Breath sounds: No stridor. No wheezing, rhonchi or rales.  Abdominal:  General: Abdomen is flat.     Palpations: There is no mass.     Tenderness: There is no abdominal tenderness. There is no guarding.  Musculoskeletal:     Cervical back: Neck supple.     Right lower leg: No edema.     Left lower leg: No edema.  Lymphadenopathy:     Cervical: No cervical adenopathy.  Skin:    General: Skin is warm and dry.  Neurological:     General: No focal deficit present.     Mental Status: He is alert.  Psychiatric:        Mood and Affect: Mood normal.        Behavior: Behavior normal.     Lab Results  Component Value Date   WBC  5.7 05/29/2021   HGB 14.2 05/29/2021   HCT 41.2 05/29/2021   PLT 287.0 05/29/2021   GLUCOSE 76 05/29/2021   CHOL 250 (H) 05/29/2021   TRIG 246.0 (H) 05/29/2021   HDL 59.80 05/29/2021   LDLDIRECT 56.0 05/29/2021   LDLCALC 93 11/20/2019   ALT 22 02/13/2021   AST 19 02/13/2021   NA 138 05/29/2021   K 4.8 05/29/2021   CL 100 05/29/2021   CREATININE 1.90 (H) 05/29/2021   BUN 19 05/29/2021   CO2 22 05/29/2021   TSH 2.24 02/13/2021   PSA 0.14 02/13/2021    DG Finger Index Right  Result Date: 05/17/2019 CLINICAL DATA:  Pain and swelling involving the index fingers since this past Sunday. No known injury. EXAM: RIGHT INDEX FINGER 2+V COMPARISON:  None. FINDINGS: Soft tissue swelling about the dorsal aspect of the DIP joint of the index finger. No associated fracture or radiopaque foreign body. Joint spaces appear preserved. No erosions. No discrete areas of osteolysis to suggest osteomyelitis. IMPRESSION: Nonspecific swelling about the soft tissues dorsal to the DIP joint of the index finger without associated fracture, radiopaque foreign body or radiographic evidence of osteomyelitis. Electronically Signed   By: Simonne Come M.D.   On: 05/17/2019 16:09    Assessment & Plan:   Marshaun was seen today for hyperlipidemia.  Diagnoses and all orders for this visit:  Fatigue, unspecified type- Will screen for CAD with a cardiac calcium score. -     EKG 12-Lead -     CBC with Differential/Platelet; Future -     CT CARDIAC SCORING (SELF PAY ONLY); Future -     Cortisol; Future  Essential hypertension- His BP is well controlled. -     CBC with Differential/Platelet; Future -     Basic metabolic panel; Future  Hypogonadism in male -     Testosterone Total,Free,Bio, Males; Future  Pure hyperglyceridemia -     Cancel: Triglycerides; Future  Chronic hyponatremia- Will check for adrenal insufficiency. -     Cortisol; Future  Elevated cholesterol with elevated triglycerides -     Lipid  panel; Future   I am having Angel Combs maintain his atorvastatin, citalopram, finasteride, omega-3 acid ethyl esters, levocetirizine, fluticasone, and Vitamin D (Ergocalciferol).  No orders of the defined types were placed in this encounter.    Follow-up: Return in about 3 months (around 08/27/2021).  Sanda Linger, MD

## 2021-05-29 ENCOUNTER — Other Ambulatory Visit (INDEPENDENT_AMBULATORY_CARE_PROVIDER_SITE_OTHER)

## 2021-05-29 DIAGNOSIS — E871 Hypo-osmolality and hyponatremia: Secondary | ICD-10-CM | POA: Diagnosis not present

## 2021-05-29 DIAGNOSIS — E782 Mixed hyperlipidemia: Secondary | ICD-10-CM | POA: Diagnosis not present

## 2021-05-29 DIAGNOSIS — I10 Essential (primary) hypertension: Secondary | ICD-10-CM

## 2021-05-29 DIAGNOSIS — R5383 Other fatigue: Secondary | ICD-10-CM | POA: Diagnosis not present

## 2021-05-29 DIAGNOSIS — E291 Testicular hypofunction: Secondary | ICD-10-CM

## 2021-05-29 LAB — CBC WITH DIFFERENTIAL/PLATELET
Basophils Absolute: 0 10*3/uL (ref 0.0–0.1)
Basophils Relative: 0.6 % (ref 0.0–3.0)
Eosinophils Absolute: 0.1 10*3/uL (ref 0.0–0.7)
Eosinophils Relative: 1.6 % (ref 0.0–5.0)
HCT: 41.2 % (ref 39.0–52.0)
Hemoglobin: 14.2 g/dL (ref 13.0–17.0)
Lymphocytes Relative: 27.8 % (ref 12.0–46.0)
Lymphs Abs: 1.6 10*3/uL (ref 0.7–4.0)
MCHC: 34.4 g/dL (ref 30.0–36.0)
MCV: 92.3 fl (ref 78.0–100.0)
Monocytes Absolute: 0.4 10*3/uL (ref 0.1–1.0)
Monocytes Relative: 7.1 % (ref 3.0–12.0)
Neutro Abs: 3.6 10*3/uL (ref 1.4–7.7)
Neutrophils Relative %: 62.9 % (ref 43.0–77.0)
Platelets: 287 10*3/uL (ref 150.0–400.0)
RBC: 4.46 Mil/uL (ref 4.22–5.81)
RDW: 13.5 % (ref 11.5–15.5)
WBC: 5.7 10*3/uL (ref 4.0–10.5)

## 2021-05-29 LAB — CORTISOL: Cortisol, Plasma: 9.9 ug/dL

## 2021-05-30 LAB — BASIC METABOLIC PANEL
BUN: 19 mg/dL (ref 6–23)
CO2: 22 mEq/L (ref 19–32)
Calcium: 10.3 mg/dL (ref 8.4–10.5)
Chloride: 100 mEq/L (ref 96–112)
Creatinine, Ser: 1.9 mg/dL — ABNORMAL HIGH (ref 0.40–1.50)
GFR: 39.86 mL/min — ABNORMAL LOW (ref >60.00)
Glucose, Bld: 76 mg/dL (ref 70–99)
Potassium: 4.8 mEq/L (ref 3.5–5.1)
Sodium: 138 mEq/L (ref 135–145)

## 2021-05-30 LAB — LDL CHOLESTEROL, DIRECT: Direct LDL: 56 mg/dL

## 2021-05-30 LAB — LIPID PANEL
Cholesterol: 250 mg/dL — ABNORMAL HIGH (ref 0–200)
HDL: 59.8 mg/dL (ref >39.00)
NonHDL: 189.77
Total CHOL/HDL Ratio: 4
Triglycerides: 246 mg/dL — ABNORMAL HIGH (ref 0.0–149.0)
VLDL: 49.2 mg/dL — ABNORMAL HIGH (ref 0.0–40.0)

## 2021-05-31 LAB — TESTOSTERONE TOTAL,FREE,BIO, MALES
Albumin: 4.7 g/dL (ref 3.6–5.1)
Sex Hormone Binding: 44 nmol/L (ref 10–50)
Testosterone, Bioavailable: 168.8 ng/dL (ref 110.0–575.0)
Testosterone, Free: 78.7 pg/mL (ref 46.0–224.0)
Testosterone: 713 ng/dL (ref 250–827)

## 2021-06-02 ENCOUNTER — Other Ambulatory Visit: Payer: Self-pay

## 2021-06-02 ENCOUNTER — Encounter: Payer: Self-pay | Admitting: Internal Medicine

## 2021-06-02 ENCOUNTER — Ambulatory Visit (INDEPENDENT_AMBULATORY_CARE_PROVIDER_SITE_OTHER): Admitting: Internal Medicine

## 2021-06-02 VITALS — BP 124/80 | HR 55 | Temp 98.2°F | Resp 16 | Ht 71.0 in | Wt 172.0 lb

## 2021-06-02 DIAGNOSIS — I10 Essential (primary) hypertension: Secondary | ICD-10-CM

## 2021-06-02 DIAGNOSIS — N1831 Chronic kidney disease, stage 3a: Secondary | ICD-10-CM | POA: Diagnosis not present

## 2021-06-02 DIAGNOSIS — E782 Mixed hyperlipidemia: Secondary | ICD-10-CM

## 2021-06-02 LAB — URINALYSIS, ROUTINE W REFLEX MICROSCOPIC
Bilirubin Urine: NEGATIVE
Hgb urine dipstick: NEGATIVE
Ketones, ur: NEGATIVE
Leukocytes,Ua: NEGATIVE
Nitrite: NEGATIVE
RBC / HPF: NONE SEEN (ref 0–?)
Specific Gravity, Urine: <=1.005 — AB (ref 1.000–1.030)
Total Protein, Urine: NEGATIVE
Urine Glucose: NEGATIVE
Urobilinogen, UA: 0.2 (ref 0.0–1.0)
pH: 6 (ref 5.0–8.0)

## 2021-06-02 LAB — BASIC METABOLIC PANEL
BUN: 17 mg/dL (ref 6–23)
CO2: 26 mEq/L (ref 19–32)
Calcium: 9.4 mg/dL (ref 8.4–10.5)
Chloride: 99 mEq/L (ref 96–112)
Creatinine, Ser: 1.24 mg/dL (ref 0.40–1.50)
GFR: 66.51 mL/min (ref >60.00)
Glucose, Bld: 79 mg/dL (ref 70–99)
Potassium: 4.3 mEq/L (ref 3.5–5.1)
Sodium: 133 mEq/L — ABNORMAL LOW (ref 135–145)

## 2021-06-02 LAB — VITAMIN D 25 HYDROXY (VIT D DEFICIENCY, FRACTURES): VITD: 41.52 ng/mL (ref 30.00–100.00)

## 2021-06-02 NOTE — Progress Notes (Signed)
Subjective:  Patient ID: Angel Combs, male    DOB: 22-Jan-1966  Age: 55 y.o. MRN: [id number]  CC: Hypertension  This visit occurred during the SARS-CoV-2 public health emergency.  Safety protocols were in place, including screening questions prior to the visit, additional usage of staff PPE, and extensive cleaning of exam room while observing appropriate contact time as indicated for disinfecting solutions.    HPI Angel Combs presents for f/up -  I saw him about 5 days ago for the complaint of fatigue.  His labs showed a decrease in his renal function with a GFR in the 40s.  He returns today for follow-up.  He occasionally takes NSAIDs (ibuprofen/aspirin).  He has stopped taking them.  He also takes a vitamin D supplement. He drinks a lot of water.  Outpatient Medications Prior to Visit  Medication Sig Dispense Refill  . atorvastatin (LIPITOR) 10 MG tablet TAKE 1 TABLET(10 MG) BY MOUTH DAILY 90 tablet 1  . citalopram (CELEXA) 10 MG tablet Take 1 tablet (10 mg total) by mouth daily. 90 tablet 2  . finasteride (PROSCAR) 5 MG tablet Take 1 tablet (5 mg total) by mouth daily. 90 tablet 1  . fluticasone (FLONASE) 50 MCG/ACT nasal spray SHAKE LIQUID AND USE 2 SPRAYS IN EACH NOSTRIL DAILY 48 g 1  . levocetirizine (XYZAL) 5 MG tablet TAKE 1 TABLET(5 MG) BY MOUTH EVERY EVENING 90 tablet 1  . omega-3 acid ethyl esters (LOVAZA) 1 g capsule Take 2 capsules (2 g total) by mouth 2 (two) times daily. 360 capsule 1  . Vitamin D, Ergocalciferol, (DRISDOL) 1.25 MG (50000 UNIT) CAPS capsule TAKE 1 CAPSULE BY MOUTH ONE TIME PER WEEK 12 capsule 0   No facility-administered medications prior to visit.    ROS Review of Systems  Constitutional: Positive for fatigue. Negative for appetite change, chills, diaphoresis and unexpected weight change.  HENT: Negative.   Respiratory: Negative.  Negative for cough, chest tightness, shortness of breath and wheezing.   Cardiovascular: Negative for chest pain,  palpitations and leg swelling.  Gastrointestinal: Negative for abdominal pain, diarrhea, nausea and vomiting.  Genitourinary: Negative.  Negative for difficulty urinating, dysuria, frequency, hematuria and urgency.  Musculoskeletal: Negative.   Skin: Negative.   Neurological: Negative.  Negative for dizziness, weakness, light-headedness, numbness and headaches.  Hematological: Negative for adenopathy. Does not bruise/bleed easily.  Psychiatric/Behavioral: Negative.     Objective:  BP 124/80 (BP Location: Left Arm, Patient Position: Sitting, Cuff Size: Large)   Pulse (!) 55   Temp 98.2 F (36.8 C) (Oral)   Resp 16   Ht 5\' 11"  (1.803 m)   Wt 172 lb (78 kg)   SpO2 98%   BMI 23.99 kg/m   BP Readings from Last 3 Encounters:  06/02/21 124/80  05/27/21 128/82  05/16/21 112/68    Wt Readings from Last 3 Encounters:  06/02/21 172 lb (78 kg)  05/27/21 176 lb (79.8 kg)  05/16/21 176 lb (79.8 kg)    Physical Exam Vitals reviewed.  HENT:     Nose: Nose normal.     Mouth/Throat:     Mouth: Mucous membranes are moist.  Eyes:     General: No scleral icterus.    Conjunctiva/sclera: Conjunctivae normal.  Cardiovascular:     Rate and Rhythm: Normal rate and regular rhythm.     Heart sounds: No murmur heard.   Pulmonary:     Effort: Pulmonary effort is normal.     Breath sounds: No stridor. No wheezing, rhonchi  or rales.  Abdominal:     General: Abdomen is flat. Bowel sounds are normal. There is no distension.     Palpations: Abdomen is soft. There is no hepatomegaly, splenomegaly or mass.     Tenderness: There is no abdominal tenderness.  Musculoskeletal:        General: Normal range of motion.     Cervical back: Neck supple.     Right lower leg: No edema.     Left lower leg: No edema.  Lymphadenopathy:     Cervical: No cervical adenopathy.  Skin:    General: Skin is warm and dry.     Coloration: Skin is not pale.  Neurological:     General: No focal deficit present.      Mental Status: He is alert.  Psychiatric:        Mood and Affect: Mood normal.        Behavior: Behavior normal.     Lab Results  Component Value Date   WBC 5.7 05/29/2021   HGB 14.2 05/29/2021   HCT 41.2 05/29/2021   PLT 287.0 05/29/2021   GLUCOSE 79 06/02/2021   CHOL 250 (H) 05/29/2021   TRIG 246.0 (H) 05/29/2021   HDL 59.80 05/29/2021   LDLDIRECT 56.0 05/29/2021   LDLCALC 93 11/20/2019   ALT 22 02/13/2021   AST 19 02/13/2021   NA 133 (L) 06/02/2021   K 4.3 06/02/2021   CL 99 06/02/2021   CREATININE 1.24 06/02/2021   BUN 17 06/02/2021   CO2 26 06/02/2021   TSH 2.24 02/13/2021   PSA 0.14 02/13/2021    DG Finger Index Right  Result Date: 05/17/2019 CLINICAL DATA:  Pain and swelling involving the index fingers since this past Sunday. No known injury. EXAM: RIGHT INDEX FINGER 2+V COMPARISON:  None. FINDINGS: Soft tissue swelling about the dorsal aspect of the DIP joint of the index finger. No associated fracture or radiopaque foreign body. Joint spaces appear preserved. No erosions. No discrete areas of osteolysis to suggest osteomyelitis. IMPRESSION: Nonspecific swelling about the soft tissues dorsal to the DIP joint of the index finger without associated fracture, radiopaque foreign body or radiographic evidence of osteomyelitis. Electronically Signed   By: Simonne Come M.D.   On: 05/17/2019 16:09    Assessment & Plan:   Angel Combs was seen today for hypertension.  Diagnoses and all orders for this visit:  Stage 3a chronic kidney disease (HCC)- His renal function has normalized.  He will continue to avoid nephrotoxic agents.  His vitamin D level is in the normal range.  I have asked him to undergo a renal ultrasound to see what his kidneys look like. -     US Renal; Future -     Basic metabolic panel; Future -     Urinalysis, Routine w reflex microscopic; Future -     VITAMIN D 25 Hydroxy (Vit-D Deficiency, Fractures); Future -     VITAMIN D 25 Hydroxy (Vit-D Deficiency,  Fractures) -     Urinalysis, Routine w reflex microscopic -     Basic metabolic panel  Essential hypertension- His blood pressure is adequately well controlled. -     VITAMIN D 25 Hydroxy (Vit-D Deficiency, Fractures); Future -     VITAMIN D 25 Hydroxy (Vit-D Deficiency, Fractures)   I am having Angel Combs maintain his atorvastatin, citalopram, finasteride, omega-3 acid ethyl esters, levocetirizine, fluticasone, and Vitamin D (Ergocalciferol).  No orders of the defined types were placed in this encounter.  Follow-up: Return in about 3 months (around 09/02/2021).  Sanda Linger, MD

## 2021-06-02 NOTE — Patient Instructions (Signed)
Chronic Kidney Disease, Adult Chronic kidney disease (CKD) occurs when the kidneys are slowly and permanently damaged over a long period of time. The kidneys are a pair of organs that do many important jobs in the body, including:  Removing waste and extra fluid from the blood to make urine.  Making hormones that maintain the amount of fluid in tissues and blood vessels.  Maintaining the right amount of fluids and chemicals in the body. A small amount of kidney damage may not cause problems, but a large amount of damage may make it hard or impossible for the kidneys to work right. Steps must be taken to slow kidney damage or to stop it from getting worse. If steps are not taken, the kidneys may stop working permanently (end-stage renal disease, or ESRD). Most of the time, CKD does not go away, but it can often be controlled. People who have CKD are usually able to live full lives. What are the causes? The most common causes of this condition are diabetes and high blood pressure (hypertension). Other causes include:  Cardiovascular diseases. These affect the heart and blood vessels.  Kidney diseases. These include: ? Glomerulonephritis, or inflammation of the tiny filters in the kidneys. ? Interstitial nephritis. This is swelling of the small tubes of the kidneys and of the surrounding structures. ? Polycystic kidney disease, in which clusters of fluid-filled sacs form within the kidneys. ? Renal vascular disease. This includes disorders that affect the arteries and veins of the kidneys.  Diseases that affect the body's defense system (immune system).  A problem with urine flow. This may be caused by: ? Kidney stones. ? Cancer. ? An enlarged prostate, in males.  A kidney infection or urinary tract infection (UTI) that keeps coming back.  Vasculitis. This is swelling or inflammation of the blood vessels. What increases the risk? Your chances of having kidney disease increase with  age. The following factors may make you more likely to develop this condition:  A family history of kidney disease or kidney failure. Kidney failure means the kidneys can no longer work right.  Certain genetic diseases.  Taking medicines often that are damaging to the kidneys.  Being around or being in contact with toxic substances.  Obesity.  A history of tobacco use. What are the signs or symptoms? Symptoms of this condition include:  Feeling very tired (lethargic) and having less energy.  Swelling, or edema, of the face, legs, ankles, or feet.  Nausea or vomiting, or loss of appetite.  Confusion or trouble concentrating.  Muscle twitches and cramps, especially in the legs.  Dry, itchy skin.  A metallic taste in the mouth.  Producing less urine, or producing more urine (especially at night).  Shortness of breath.  Trouble sleeping. CKD may also result in not having enough red blood cells or hemoglobin in the blood (anemia) or having weak bones (bone disease). Symptoms develop slowly and may not be obvious until the kidney damage becomes severe. It is possible to have kidney disease for years without having symptoms. How is this diagnosed? This condition may be diagnosed based on:  Blood tests.  Urine tests.  Imaging tests, such as an ultrasound or a CT scan.  A kidney biopsy. This involves removing a sample of kidney tissue to be looked at under a microscope. Results from these tests will help to determine how serious the CKD is. How is this treated? There is no cure for most cases of this condition, but treatment usually relieves   symptoms and prevents or slows the worsening of the disease. Treatment may include:  Diet changes, which may require you to avoid alcohol and foods that are high in salt, potassium, phosphorous, and protein.  Medicines. These may: ? Lower blood pressure. ? Control blood sugar (glucose). ? Relieve anemia. ? Relieve  swelling. ? Protect your bones. ? Improve the balance of salts and minerals in your blood (electrolytes).  Dialysis, which is a type of treatment that removes toxic waste from the body. It may be needed if you have kidney failure.  Managing any other conditions that are causing your CKD or making it worse. Follow these instructions at home: Medicines  Take over-the-counter and prescription medicines only as told by your health care provider. The amount of some medicines that you take may need to be changed.  Do not take any new medicines unless approved by your health care provider. Many medicines can make kidney damage worse.  Do not take any vitamin and mineral supplements unless approved by your health care provider. Many nutritional supplements can make kidney damage worse. Lifestyle  Do not use any products that contain nicotine or tobacco, such as cigarettes, e-cigarettes, and chewing tobacco. If you need help quitting, ask your health care provider.  If you drink alcohol: ? Limit how much you use to:  0-1 drink a day for women who are not pregnant.  0-2 drinks a day for men. ? Know how much alcohol is in your drink. In the [place] one drink equals one 12 oz bottle of beer (355 mL), one 5 oz glass of wine (148 mL), or one 1 oz glass of hard liquor (44 mL).  Maintain a healthy weight. If you need help, ask your health care provider.   General instructions  Follow instructions from your health care provider about eating or drinking restrictions, including any prescribed diet.  Track your blood pressure at home. Report changes in your blood pressure as told.  If you are being treated for diabetes, track your blood glucose levels as told.  Start or continue an exercise plan. Exercise at least 30 minutes a day, 5 days a week.  Keep your immunizations up to date as told.  Keep all follow-up visits. This is important.   Where to find more information  American Association of  Kidney Patients: www.aakp.org  National Kidney Foundation: www.kidney.org  American Kidney Fund: www.akfinc.org  Life Options: www.lifeoptions.org  Kidney School: www.kidneyschool.org Contact a health care provider if:  Your symptoms get worse.  You develop new symptoms. Get help right away if:  You develop symptoms of ESRD. These include: ? Headaches. ? Numbness in your hands or feet. ? Easy bruising. ? Frequent hiccups. ? Chest pain. ? Shortness of breath. ? Lack of menstrual periods, in women.  You have a fever.  You are producing less urine than usual.  You have pain or bleeding when you urinate or when you have a bowel movement. These symptoms may represent a serious problem that is an emergency. Do not wait to see if the symptoms will go away. Get medical help right away. Call your local emergency services (911 in the [place] Do not drive yourself to the hospital. Summary  Chronic kidney disease (CKD) occurs when the kidneys become damaged slowly over a long period of time.  The most common causes of this condition are diabetes and high blood pressure (hypertension).  There is no cure for most cases of CKD, but treatment usually relieves symptoms and prevents   or slows the worsening of the disease. Treatment may include a combination of lifestyle changes, medicines, and dialysis. This information is not intended to replace advice given to you by your health care provider. Make sure you discuss any questions you have with your health care provider. Document Revised: 03/20/2020 Document Reviewed: 03/20/2020 Elsevier Patient Education  2021 Elsevier Inc.  

## 2021-06-04 ENCOUNTER — Ambulatory Visit
Admission: RE | Admit: 2021-06-04 | Discharge: 2021-06-04 | Disposition: A | Discharge status: Home / Self Care | Source: Ambulatory Visit | Attending: Internal Medicine | Admitting: Internal Medicine

## 2021-06-04 DIAGNOSIS — N1831 Chronic kidney disease, stage 3a: Secondary | ICD-10-CM

## 2021-06-04 IMAGING — US US RENAL
1 series · 14 of 25 positions shown · non-contrast
Comparison: None.

CLINICAL DATA: Chronic kidney disease stage 3A. no pain or
hematuria.

EXAM:
RENAL / URINARY TRACT ULTRASOUND COMPLETE

[Series 1: us renal · 0.25mm/px · 14 of 32 slices shown]
[im 1/32]
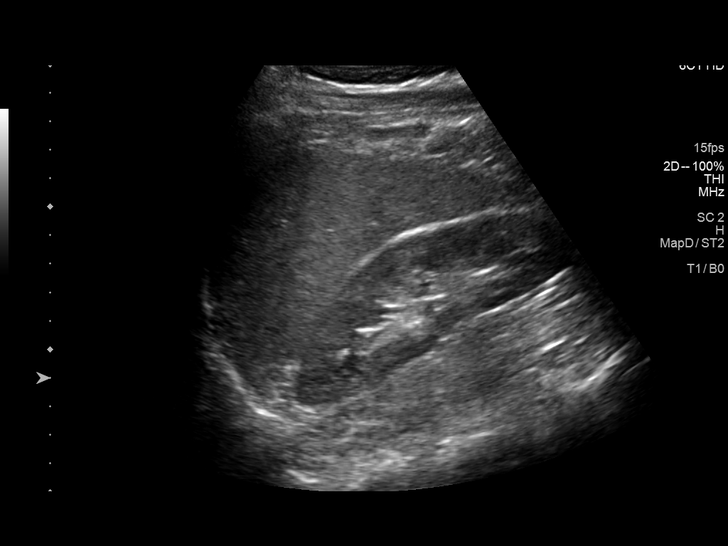
[im 3/32]
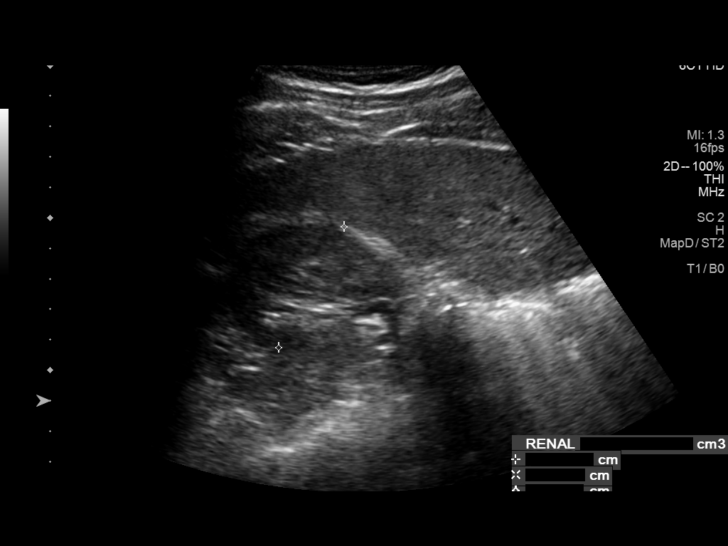
[im 6/32]
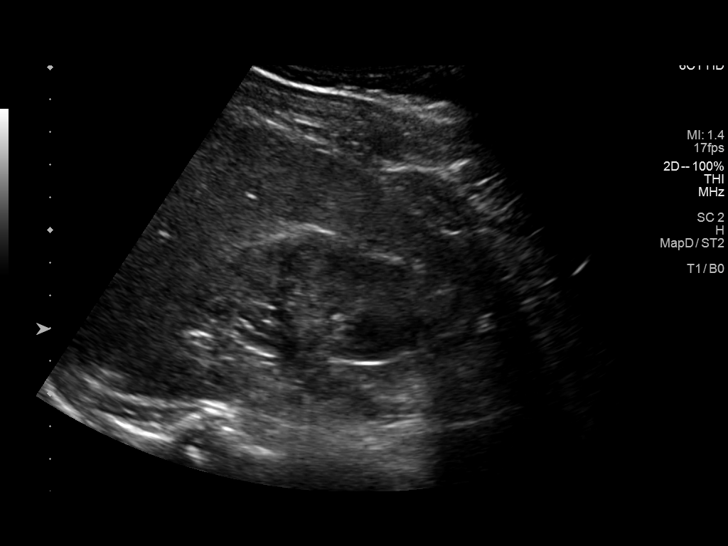
[im 8/32]
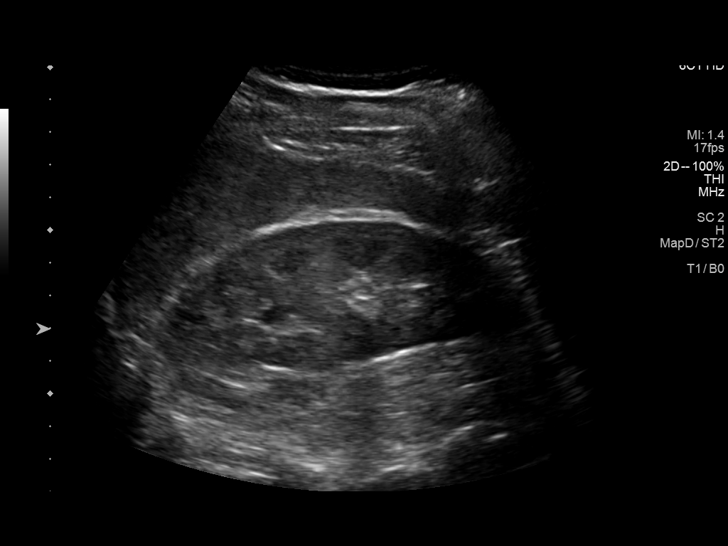
[im 11/32]
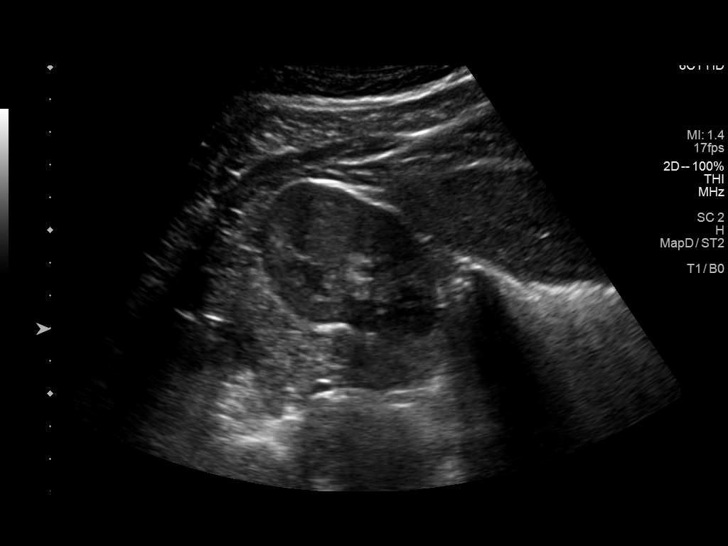
[im 12/32]
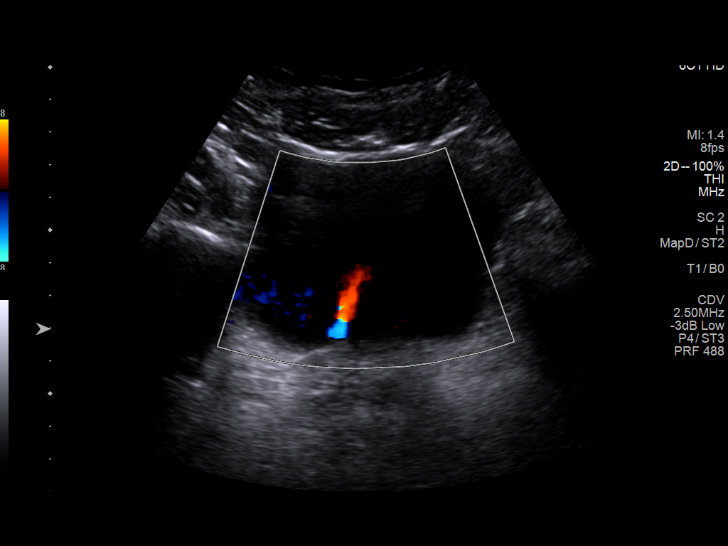
[im 15/32]
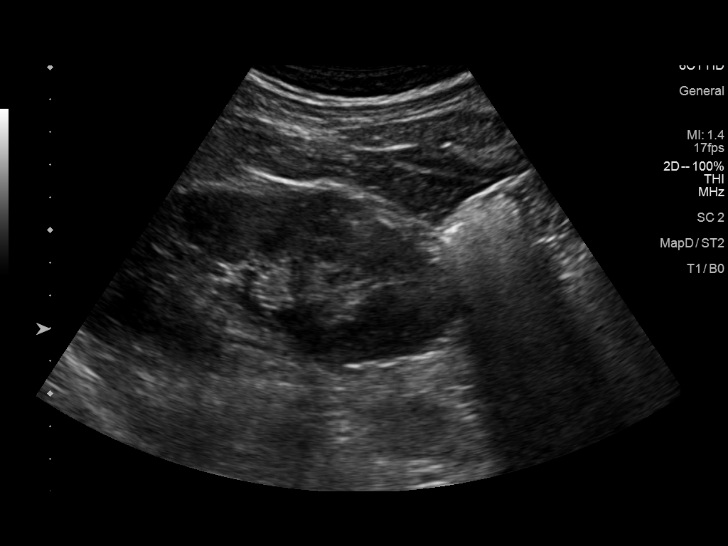
[im 17/32]
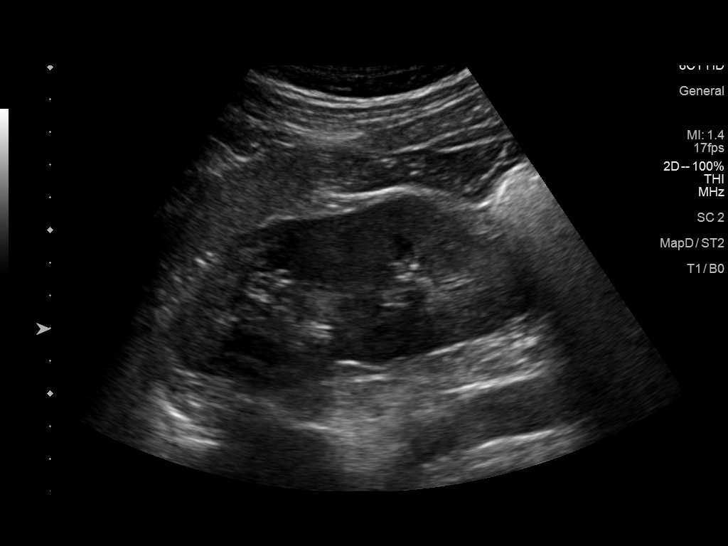
[im 20/32]
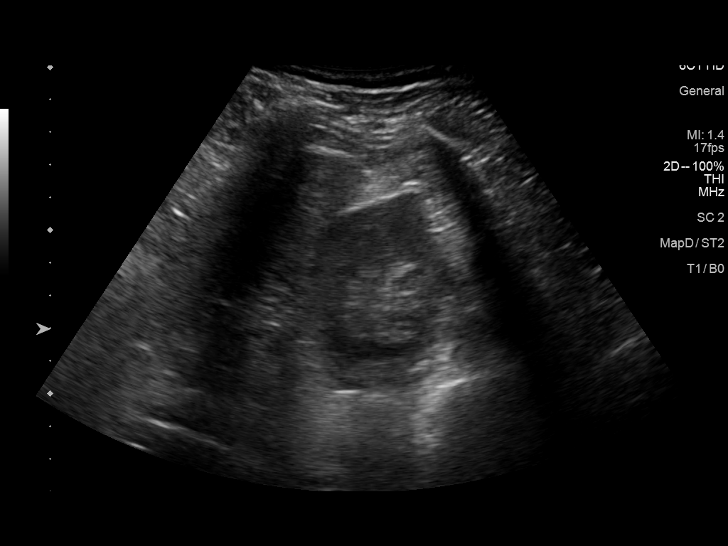
[im 21/32]
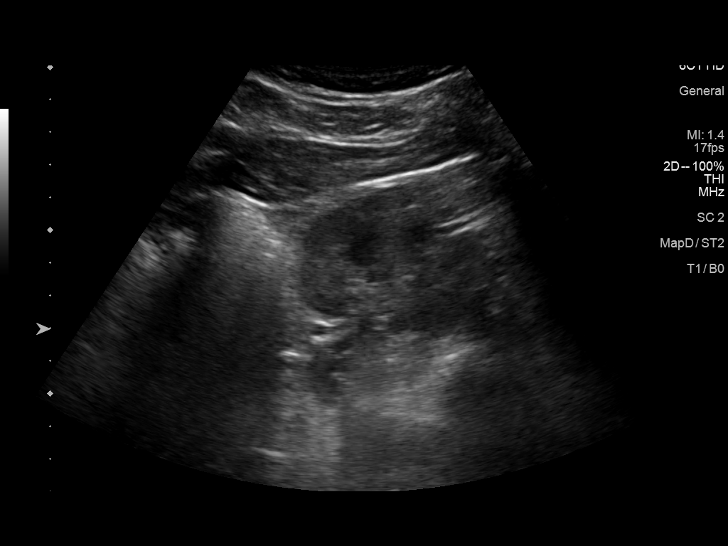
[im 24/32]
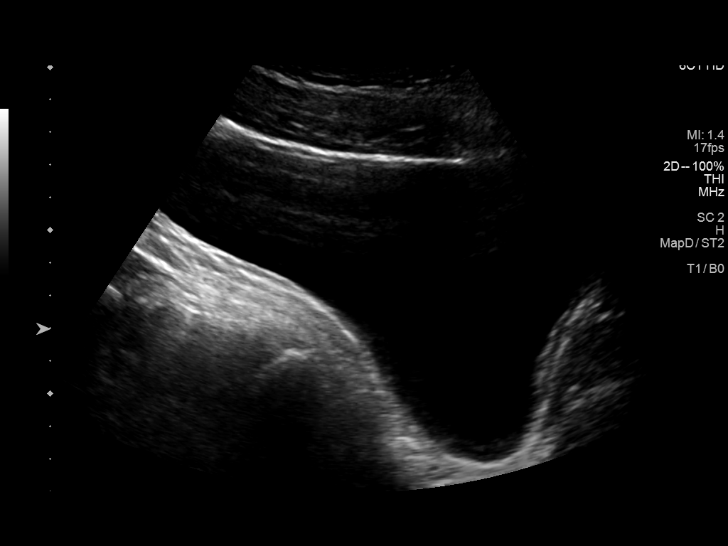
[im 26/32]
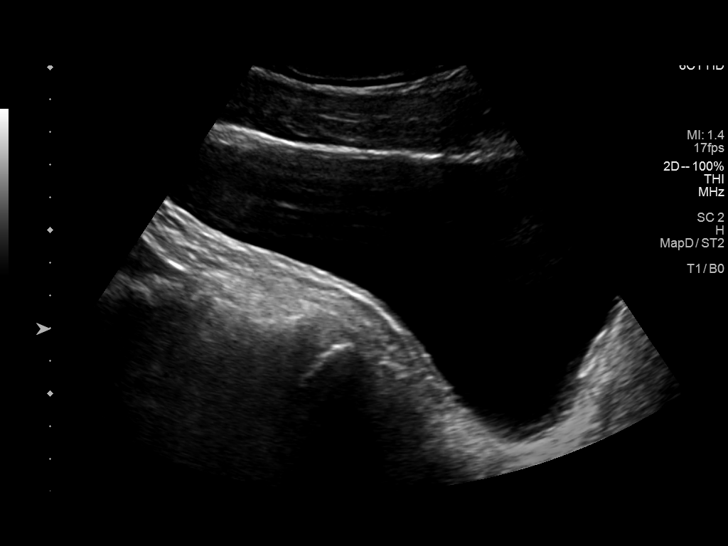
[im 29/32]
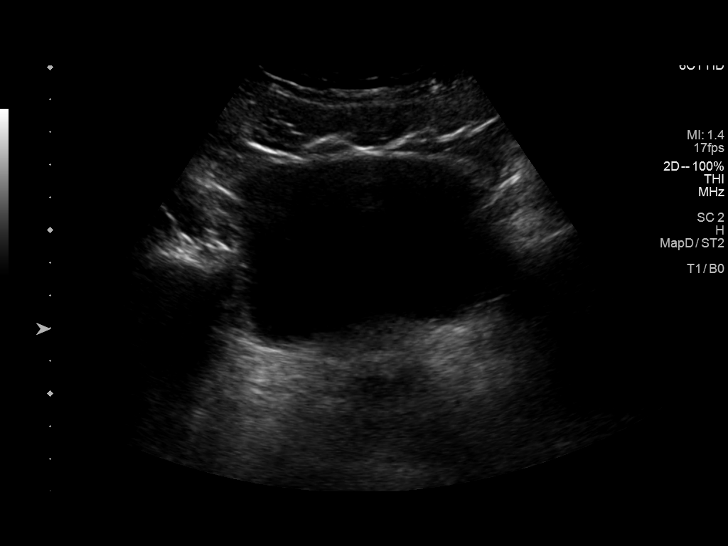
[im 32/32]
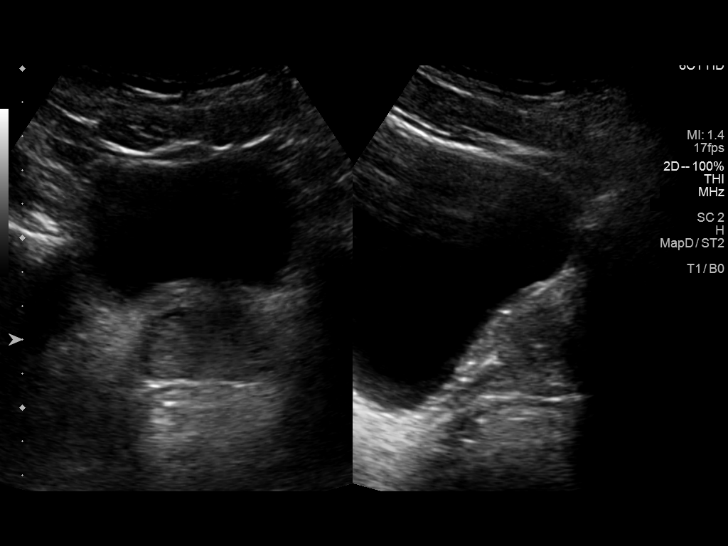

[14 of 25 positions shown; findings below may reference images not displayed]

FINDINGS: Right Kidney:

Renal measurements: 11.5 x 4.6 x 4.5 cm = volume: 124 mL.
Echogenicity within normal limits. No mass or hydronephrosis
visualized.

Left Kidney:

Renal measurements: 11.6 x 5.0 x 6.6 cm = volume: 200 mL.
Echogenicity within normal limits. No mass or hydronephrosis
visualized.

Bladder:

Appears normal for the degree of bladder distension. Only a
right-sided ureteral jet was visualized. No apparent hydronephrosis.

Other:

None.
IMPRESSION: Normal renal ultrasound.

## 2021-06-10 ENCOUNTER — Encounter: Payer: Self-pay | Admitting: Internal Medicine

## 2021-06-18 ENCOUNTER — Ambulatory Visit: Admitting: Family Medicine

## 2021-07-01 NOTE — Progress Notes (Signed)
[name] [address] [place] [address] Phone: [phone] Subjective:   Angel Combs, am serving as a scribe for Dr. Antoine Primas.  This visit occurred during the SARS-CoV-2 public health emergency.  Safety protocols were in place, including screening questions prior to the visit, additional usage of staff PPE, and extensive cleaning of exam room while observing appropriate contact time as indicated for disinfecting solutions.    I'm seeing this patient by the request  of:  Etta Grandchild, MD  CC: Left finger injury follow-up  PEJ:[id number]  05/16/2021 Patient has done fairly remarkably well with the rotator cuff tear.  Patient did do PRP greater than 3 years ago at this time and is wondering if he needs a repeat.  Discussed with him about possibly if further imaging.  Patient is considering noted just doing the PRP to see how patient responds.  We will discuss further at follow-up.  Patient has made some progress at this time.  Patient actually does have some extension noted which is improvement from previous exam.  Encourage patient to continue the potential bracing for another 6 weeks.  Patient was adamant he would like [place] to consider the possibility of PRP.  Discussed with him that the evidence for this problem and especially with the duration of his symptoms was not great quality but is likely a low concern for causing injury as well.  Patient wants to consider it.  We discussed we can do it but I do not think it is completely necessary.  Follow-up with me again 6 weeks  Update 07/02/2021 Devlin Brink is a 55 y.o. male coming in with complaint of L hand pain. Patient states that he has been active and uses hands without pain.  Patient has come out of the brace and little bit and is doing well.  States that it was a little stiff initially but now has what he feels is full range of motion.  Still some pain with resisted extension but otherwise  doing well.     Past Medical History:  Diagnosis Date   ALLERGIC RHINITIS CAUSE UNSPECIFIED    Anxiety    using celxa.   LACTOSE INTOLERANCE    Left shoulder pain July '13   had tried accupuncture.    Pain of left heel    PATELLO-FEMORAL SYNDROME    sees chiropractor with good results using exercises.   Past Surgical History:  Procedure Laterality Date   lasik  Augu 22, 2013   NOSE SURGERY  1991   Social History   Socioeconomic History   Marital status: Single    Spouse name: Not on file   Number of children: 0   Years of education: 44   Highest education level: Not on file  Occupational History   Occupation: Real Chief Technology Officer  Tobacco Use   Smoking status: Never   Smokeless tobacco: Never  Substance and Sexual Activity   Alcohol use: Yes    Alcohol/week: 15.0 standard drinks    Types: 15 drink(s) per week   Drug use: No   Sexual activity: Yes    Partners: Female  Other Topics Concern   Not on file  Social History Narrative   HSG, [employer]/ROTC. BS, MBA, MA-geography, MA foreign affairs  [place], MS- MIS. 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 No regular exercise program.   Social Determinants  of Health   Financial Resource Strain: Not on file  Food Insecurity: Not on file  Transportation Needs: Not on file  Physical Activity: Not on file  Stress: Not on file  Social Connections: Not on file   No Known Allergies Family History  Problem Relation Age of Onset   Hyperlipidemia Father    Hypertension Father    Coronary artery disease Father    Diabetes Maternal Grandmother    Coronary artery disease Other    Arthritis Mother        back pain   Cancer Neg Hx    Depression Neg Hx    Early death Neg Hx    Hearing loss Neg Hx    Heart disease Neg Hx    Kidney disease Neg Hx    Stroke Neg Hx      Current Outpatient Medications (Cardiovascular):     atorvastatin (LIPITOR) 10 MG tablet, TAKE 1 TABLET(10 MG) BY MOUTH DAILY   omega-3 acid ethyl esters (LOVAZA) 1 g capsule, Take 2 capsules (2 g total) by mouth 2 (two) times daily.  Current Outpatient Medications (Respiratory):    fluticasone (FLONASE) 50 MCG/ACT nasal spray, SHAKE LIQUID AND USE 2 SPRAYS IN EACH NOSTRIL DAILY   levocetirizine (XYZAL) 5 MG tablet, TAKE 1 TABLET(5 MG) BY MOUTH EVERY EVENING    Current Outpatient Medications (Other):    citalopram (CELEXA) 10 MG tablet, Take 1 tablet (10 mg total) by mouth daily.   finasteride (PROSCAR) 5 MG tablet, Take 1 tablet (5 mg total) by mouth daily.   Vitamin D, Ergocalciferol, (DRISDOL) 1.25 MG (50000 UNIT) CAPS capsule, TAKE 1 CAPSULE BY MOUTH ONE TIME PER WEEK   Reviewed prior external information including notes and imaging from  primary care provider As well as notes that were available from care everywhere and other healthcare systems.  Past medical history, social, surgical and family history all reviewed in electronic medical record.  No pertanent information unless stated regarding to the chief complaint.   Review of Systems:  No headache, visual changes, nausea, vomiting, diarrhea, constipation, dizziness, abdominal pain, skin rash, fevers, chills, night sweats, weight loss, swollen lymph nodes, body aches, joint swelling, chest pain, shortness of breath, mood changes. POSITIVE muscle aches  Objective  Blood pressure 102/70, pulse 83, height 5\' 11"  (1.803 m), weight 169 lb (76.7 kg), SpO2 97 %.   General: No apparent distress alert and oriented x3 mood and affect normal, dressed appropriately.  HEENT: Pupils equal, extraocular movements intact  Respiratory: Patient's speak in full sentences and does not appear short of breath  Cardiovascular: No lower extremity edema, non tender, no erythema  Gait normal with good balance and coordination.  MSK: Left hand exam shows the patient's DIP of the left fifth finger does have  some mild hypertrophy noted.  Patient does have full flexion extension with no angulation or rotation noted.  Patient is very minimally tender in this area.  No weakness noted with against resisted extension.  Limited musculoskeletal ultrasound was performed and interpreted by  Limited ultrasound shows the patient's avulsion seems to still be there but now patient does have scar tissue formation distal to the joint of the extensor tendon.  Dynamic testing shows no gapping.  No significant hypoechoic changes in the joint itself. Impression: Interval healing of tendon noted.    Impression and Recommendations:    The above documentation has been reviewed and is accurate and complete Judi Saa, DO

## 2021-07-02 ENCOUNTER — Ambulatory Visit: Payer: Self-pay

## 2021-07-02 ENCOUNTER — Other Ambulatory Visit: Payer: Self-pay

## 2021-07-02 ENCOUNTER — Ambulatory Visit (INDEPENDENT_AMBULATORY_CARE_PROVIDER_SITE_OTHER): Admitting: Family Medicine

## 2021-07-02 ENCOUNTER — Encounter: Payer: Self-pay | Admitting: Family Medicine

## 2021-07-02 VITALS — BP 102/70 | HR 83 | Ht 71.0 in | Wt 169.0 lb

## 2021-07-02 DIAGNOSIS — M20012 Mallet finger of left finger(s): Secondary | ICD-10-CM

## 2021-07-02 DIAGNOSIS — M79642 Pain in left hand: Secondary | ICD-10-CM

## 2021-07-02 NOTE — Assessment & Plan Note (Signed)
Patient has made significant improvement at this time.  Does have the avulsion still noted but patient has full range of motion patient has not been working out regularly but does not have any type of labs at the moment.  Patient can follow-up with me as needed.

## 2021-07-02 NOTE — Patient Instructions (Signed)
Good to see you Smaller brace with working out for the next month just in case See me again when you need me

## 2021-07-29 ENCOUNTER — Ambulatory Visit (INDEPENDENT_AMBULATORY_CARE_PROVIDER_SITE_OTHER)
Admission: RE | Admit: 2021-07-29 | Discharge: 2021-07-29 | Disposition: A | Discharge status: Home / Self Care | Payer: Self-pay | Source: Ambulatory Visit | Attending: Internal Medicine | Admitting: Internal Medicine

## 2021-07-29 ENCOUNTER — Other Ambulatory Visit: Payer: Self-pay

## 2021-07-29 DIAGNOSIS — R5383 Other fatigue: Secondary | ICD-10-CM

## 2021-07-29 IMAGING — CT CT CARDIAC CORONARY ARTERY CALCIUM SCORE
3 series · 14 of 20 positions shown, 15 images · non-contrast
Comparison: None.
COMPARISON: None.

Addendum:
EXAM:
OVER-READ INTERPRETATION  CT CHEST

The following report is an over-read performed by radiologist Dr.
over-read does not include interpretation of cardiac or coronary
anatomy or pathology. The coronary calcium score interpretation by
the cardiologist is attached.
CLINICAL DATA: Cardiovascular Disease Risk stratification
Coronary Calcium Score
TECHNIQUE: A gated, non-contrast computed tomography scan of the heart was
performed using 3mm slice thickness. Axial images were analyzed on a
dedicated workstation. Calcium scoring of the coronary arteries was
performed using the Agatston method.

[Series 2: casc 3.0 bv41 2 bestdiast 70 % · axial · 0.34mm/px · z∈[+1309,+1381]mm · 4 of 41 slices shown, 5 images]
[im 9/41  vessel]
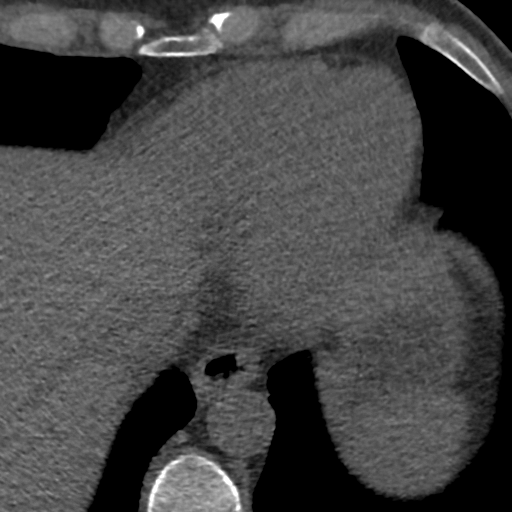
[im 9/41  lung]
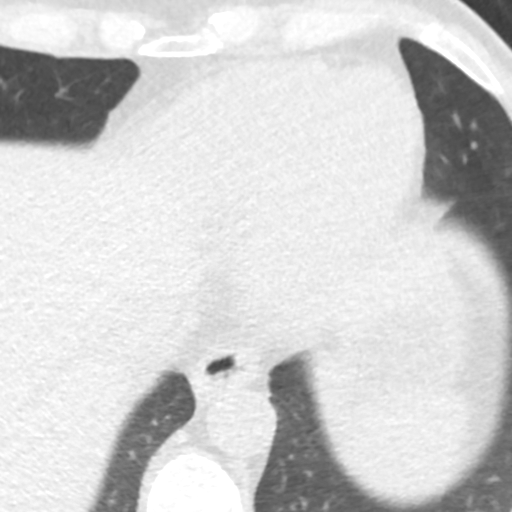
[im 17/41  vessel]
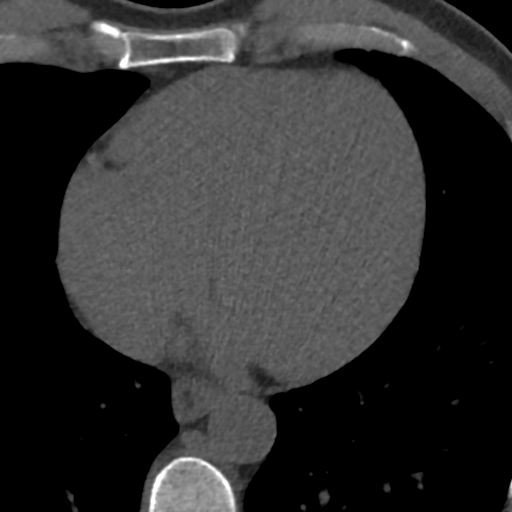
[im 25/41  vessel]
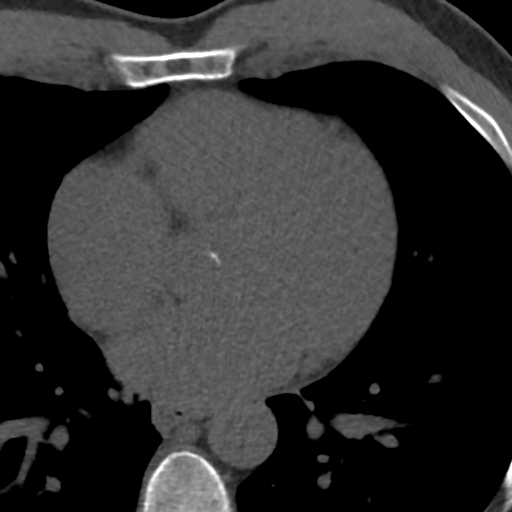
[im 33/41  vessel]
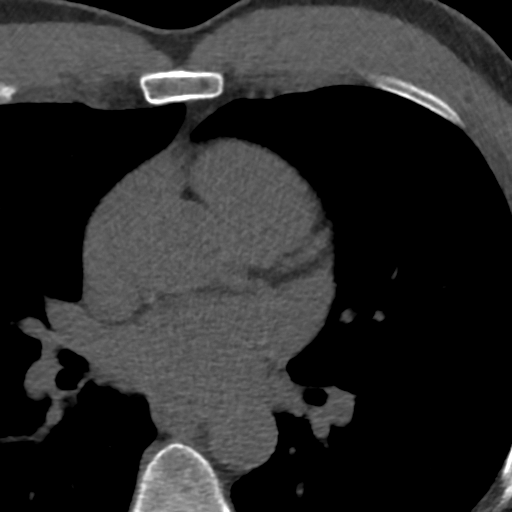

[Series 3: lung 68 % · axial · 0.68mm/px · z∈[+1303,+1384]mm · 5 of 41 slices shown]
[im 7/41  lung]
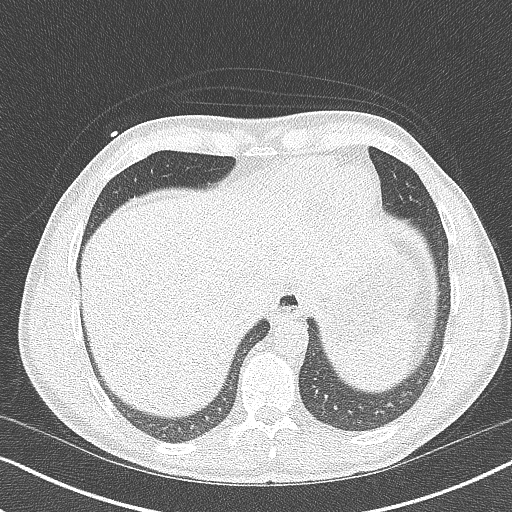
[im 14/41  lung]
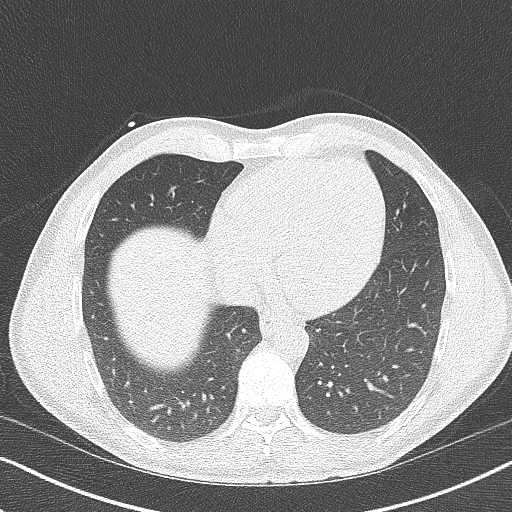
[im 21/41  lung]
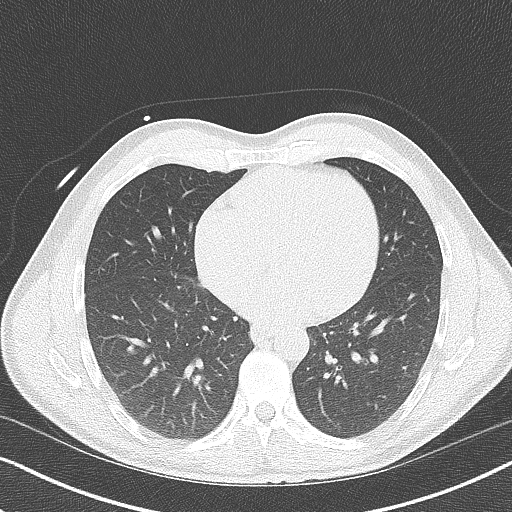
[im 27/41  lung]
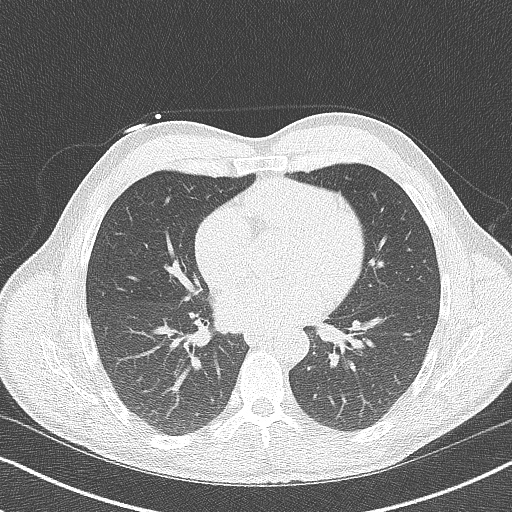
[im 34/41  lung]
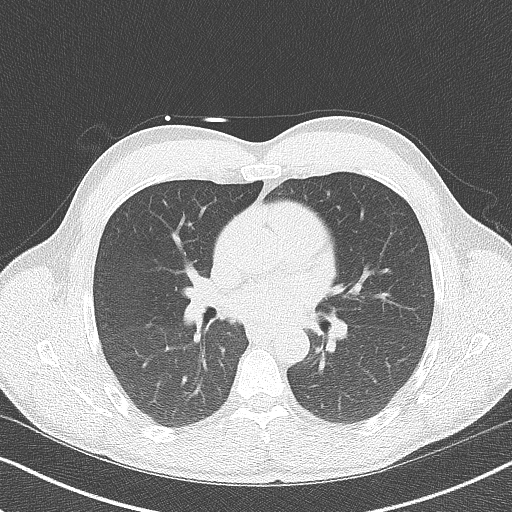

[Series 4: lung st 68 % · axial · 0.68mm/px · z∈[+1303,+1384]mm · 5 of 41 slices shown]
[im 7/41  lung]
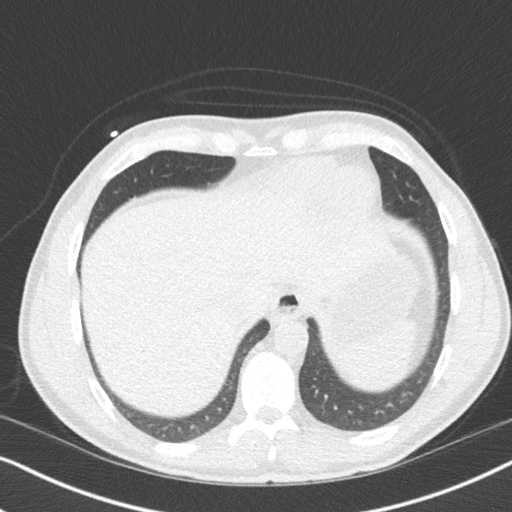
[im 14/41  lung]
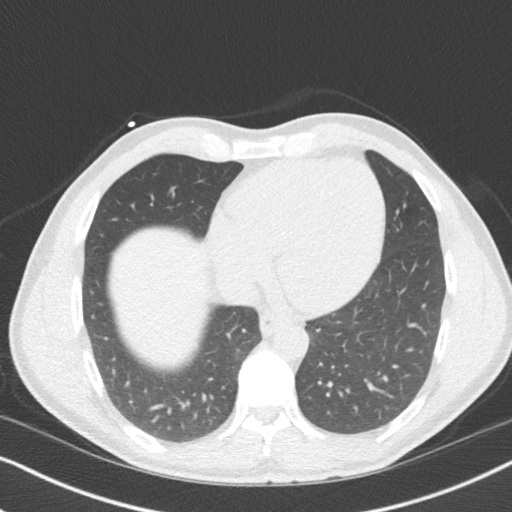
[im 21/41  lung]
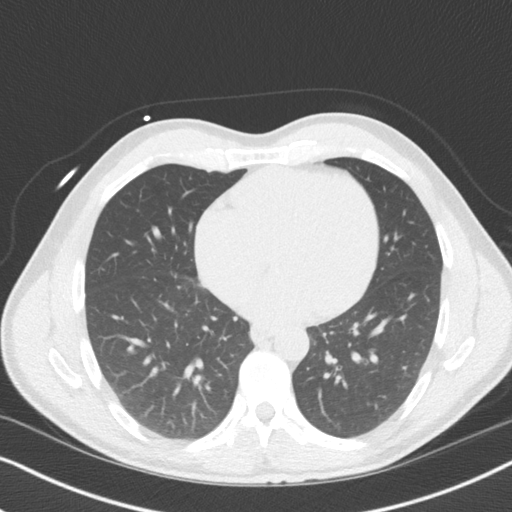
[im 27/41  lung]
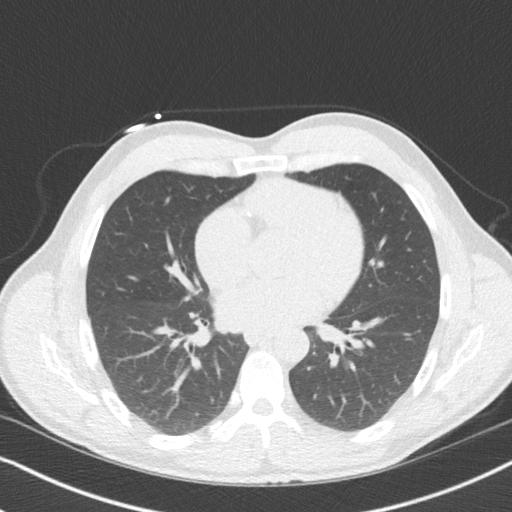
[im 34/41  lung]
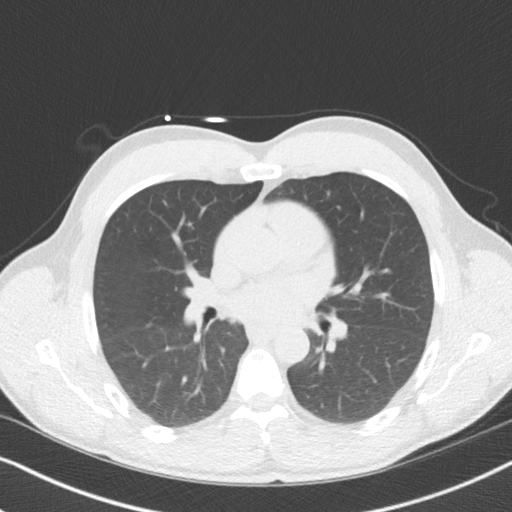

[14 of 20 positions shown; findings below may reference images not displayed]

FINDINGS: Vascular: Mild focal calcification associated with the aortic valve.

Mediastinum/Nodes: Visualized mediastinum and hilar regions
demonstrate no lymphadenopathy or masses.

Lungs/Pleura: There is no evidence of pulmonary edema,
consolidation, pneumothorax, nodule or pleural fluid.

Upper Abdomen: No acute abnormality.

Musculoskeletal: No chest wall mass or suspicious bone lesions
identified.
IMPRESSION: Focal calcification associated with the aortic valve.
FINDINGS: Coronary arteries: Normal origins.

Coronary Calcium Score:

Left main: 0

Left anterior descending artery: 1

Left circumflex artery: 0

Right coronary artery: 76

Total: 77

Percentile: 80

Pericardium: Normal.

Ascending Aorta: Normal caliber. Mild focal aortic valve leaflet
sclerosis.

Non-cardiac: See separate report from [REDACTED].
IMPRESSION: 1. Coronary calcium score of 77. This was 80 percentile for age-,
race-, and sex-matched controls.

2. Mild focal aortic valve leaflet sclerosis.



If CAC=0, it is reasonable to withhold statin therapy and reassess
in 5 to 10 years, as long as higher risk conditions are absent
(diabetes mellitus, family history of premature CHD in first degree
relatives (males <55 years; females <65 years), cigarette smoking,
or LDL >=190 mg/dL).

If CAC is 1 to 99, it is reasonable to initiate statin therapy for
patients >=55 years of age.

If CAC is >=100 or >=75th percentile, it is reasonable to initiate
statin therapy at any age.

Cardiology referral should be considered for patients with CAC
scores >=400 or >=75th percentile.

*4357 AHA/ACC/AACVPR/AAPA/ABC/NANAPOKU/AMADOR ARTICA/QUA/Susu/MONIA/RUBE/RUSHI
Guideline on the Management of Blood Cholesterol: A Report of the
American College of Cardiology/American Heart Association Task Force
on Clinical Practice Guidelines. J Am Coll Cardiol.
4551;73(24):8063-8462.

*** End of Addendum ***
EXAM:
OVER-READ INTERPRETATION  CT CHEST

The following report is an over-read performed by radiologist Dr.
over-read does not include interpretation of cardiac or coronary
anatomy or pathology. The coronary calcium score interpretation by
the cardiologist is attached.
FINDINGS: Vascular: Mild focal calcification associated with the aortic valve.

Mediastinum/Nodes: Visualized mediastinum and hilar regions
demonstrate no lymphadenopathy or masses.

Lungs/Pleura: There is no evidence of pulmonary edema,
consolidation, pneumothorax, nodule or pleural fluid.

Upper Abdomen: No acute abnormality.

Musculoskeletal: No chest wall mass or suspicious bone lesions
identified.
IMPRESSION: Focal calcification associated with the aortic valve.

## 2021-07-30 ENCOUNTER — Other Ambulatory Visit: Payer: Self-pay | Admitting: Internal Medicine

## 2021-07-30 ENCOUNTER — Other Ambulatory Visit: Payer: Self-pay | Admitting: Family Medicine

## 2021-07-30 DIAGNOSIS — J301 Allergic rhinitis due to pollen: Secondary | ICD-10-CM

## 2021-07-30 DIAGNOSIS — E782 Mixed hyperlipidemia: Secondary | ICD-10-CM

## 2021-07-30 DIAGNOSIS — E781 Pure hyperglyceridemia: Secondary | ICD-10-CM

## 2021-07-30 DIAGNOSIS — L659 Nonscarring hair loss, unspecified: Secondary | ICD-10-CM

## 2021-08-12 ENCOUNTER — Other Ambulatory Visit: Payer: Self-pay | Admitting: Internal Medicine

## 2021-08-12 ENCOUNTER — Encounter: Payer: Self-pay | Admitting: Internal Medicine

## 2021-08-12 DIAGNOSIS — H1033 Unspecified acute conjunctivitis, bilateral: Secondary | ICD-10-CM | POA: Insufficient documentation

## 2021-08-12 MED ORDER — NEOMYCIN-POLYMYXIN-DEXAMETH 3.5-10000-0.1 OP SUSP: 2.0000 [drp] | Freq: Four times a day (QID) | OPHTHALMIC | 0 refills | Status: DC

## 2021-09-02 ENCOUNTER — Ambulatory Visit: Admitting: Internal Medicine

## 2021-09-09 ENCOUNTER — Encounter: Payer: Self-pay | Admitting: Internal Medicine

## 2021-09-09 ENCOUNTER — Other Ambulatory Visit: Payer: Self-pay

## 2021-09-09 ENCOUNTER — Ambulatory Visit (INDEPENDENT_AMBULATORY_CARE_PROVIDER_SITE_OTHER): Admitting: Internal Medicine

## 2021-09-09 VITALS — BP 110/70 | HR 56 | Temp 98.2°F | Ht 71.0 in | Wt 171.0 lb

## 2021-09-09 DIAGNOSIS — Z23 Encounter for immunization: Secondary | ICD-10-CM

## 2021-09-09 DIAGNOSIS — I10 Essential (primary) hypertension: Secondary | ICD-10-CM | POA: Diagnosis not present

## 2021-09-09 DIAGNOSIS — Z1211 Encounter for screening for malignant neoplasm of colon: Secondary | ICD-10-CM

## 2021-09-09 DIAGNOSIS — E871 Hypo-osmolality and hyponatremia: Secondary | ICD-10-CM

## 2021-09-09 LAB — BASIC METABOLIC PANEL
BUN: 18 mg/dL (ref 6–23)
CO2: 26 mEq/L (ref 19–32)
Calcium: 9.4 mg/dL (ref 8.4–10.5)
Chloride: 100 mEq/L (ref 96–112)
Creatinine, Ser: 1.22 mg/dL (ref 0.40–1.50)
GFR: 67.7 mL/min (ref >60.00)
Glucose, Bld: 80 mg/dL (ref 70–99)
Potassium: 3.9 mEq/L (ref 3.5–5.1)
Sodium: 134 mEq/L — ABNORMAL LOW (ref 135–145)

## 2021-09-09 LAB — CORTISOL: Cortisol, Plasma: 7.5 ug/dL

## 2021-09-09 NOTE — Patient Instructions (Signed)
Hyponatremia Hyponatremia is when the amount of salt (sodium) in your blood is too low. When salt levels are low, your body may take in extra water. This can cause swelling throughout the body. The swelling oftenaffects the brain. What are the causes? This condition may be caused by: Certain medical problems or conditions. Vomiting a lot. Having watery poop (diarrhea) often. Certain medicines or illegal drugs. Not having enough water in the body (dehydration). Drinking too much water. Eating a diet that is low in salt. Large burns on your body. Too much sweating. What increases the risk? You are more likely to get this condition if you: Have long-term (chronic) kidney disease. Have heart failure. Have a medical condition that causes you to have watery poop often. Do very hard exercises. Take medicines that affect the amount of salt is in your blood. What are the signs or symptoms? Symptoms of this condition include: Headache. Feeling like you may vomit (nausea). Vomiting. Being very tired (lethargic). Muscle weakness and cramps. Not wanting to eat as much as normal (loss of appetite). Feeling weak or light-headed. Severe symptoms of this condition include: Confusion. Feeling restless (agitation). Having a fast heart rate. Passing out (fainting). Seizures. Coma. How is this treated? Treatment for this condition depends on the cause. Treatment may include: Getting fluids through an IV tube that is put into one of your veins. Taking medicines to fix the salt levels in your blood. If medicines are causing the problem, your medicines will need to be changed. Limiting how much water or fluid you take in. Monitoring in the hospital to watch your symptoms. Follow these instructions at home:  Take over-the-counter and prescription medicines only as told by your doctor. Many medicines can make this condition worse. Talk with your doctor about any medicines that you are taking. Eat  and drink exactly as you are told by your doctor. Eat only the foods you are told to eat. Limit how much fluid you take. Do not drink alcohol. Keep all follow-up visits as told by your doctor. This is important. Contact a doctor if: You feel more like you may vomit. You feel more tired. Your headache gets worse. You feel more confused. You feel weaker. Your symptoms go away and then they come back. You have trouble following the diet instructions. Get help right away if: You have a seizure. You pass out. You keep having watery poop. You keep vomiting. Summary Hyponatremia is when the amount of salt in your blood is too low. When salt levels are low, you can have swelling throughout the body. The swelling mostly affects the brain. Treatment depends on the cause. Treatment may include getting IV fluids, medicines, or not drinking as much fluid. This information is not intended to replace advice given to you by your health care provider. Make sure you discuss any questions you have with your healthcare provider. Document Revised: 03/02/2019 Document Reviewed: 11/17/2018 Elsevier Patient Education  2022 [employer]vier Inc.  

## 2021-09-09 NOTE — Progress Notes (Signed)
Subjective:  Patient ID: Angel Combs, male    DOB: 09/20/1966  Age: 55 y.o. MRN: [id number]  CC: Hypertension  This visit occurred during the SARS-CoV-2 public health emergency.  Safety protocols were in place, including screening questions prior to the visit, additional usage of staff PPE, and extensive cleaning of exam room while observing appropriate contact time as indicated for disinfecting solutions.    HPI Angel Combs presents for f/up -   He returns for follow-up on hyponatremia.  He admits that he drinks a lot of water.  He has done this for years.  He does not know why.  He is active and denies chest pain, shortness of breath, dizziness, lightheadedness, paresthesias, or edema.  Outpatient Medications Prior to Visit  Medication Sig Dispense Refill   atorvastatin (LIPITOR) 10 MG tablet TAKE 1 TABLET BY MOUTH EVERY DAY 90 tablet 1   citalopram (CELEXA) 10 MG tablet Take 1 tablet (10 mg total) by mouth daily. 90 tablet 2   finasteride (PROSCAR) 5 MG tablet TAKE 1 TABLET (5 MG TOTAL) BY MOUTH DAILY. 90 tablet 1   fluticasone (FLONASE) 50 MCG/ACT nasal spray SHAKE LIQUID AND USE 2 SPRAYS IN EACH NOSTRIL DAILY 48 mL 1   levocetirizine (XYZAL) 5 MG tablet TAKE 1 TABLET(5 MG) BY MOUTH EVERY EVENING 90 tablet 1   neomycin-polymyxin b-dexamethasone (MAXITROL) 3.5-10000-0.1 SUSP Place 2 drops into both eyes every 6 (six) hours. 5 mL 0   omega-3 acid ethyl esters (LOVAZA) 1 g capsule TAKE 2 CAPSULES BY MOUTH 2 TIMES DAILY. 360 capsule 1   Vitamin D, Ergocalciferol, (DRISDOL) 1.25 MG (50000 UNIT) CAPS capsule TAKE 1 CAPSULE BY MOUTH ONE TIME PER WEEK 12 capsule 0   No facility-administered medications prior to visit.    ROS Review of Systems  Constitutional:  Negative for diaphoresis and fatigue.  HENT: Negative.    Eyes: Negative.   Respiratory:  Negative for cough, chest tightness, shortness of breath and wheezing.   Cardiovascular:  Negative for chest pain, palpitations and leg  swelling.  Gastrointestinal:  Negative for abdominal pain, diarrhea, nausea and vomiting.  Endocrine: Positive for polydipsia. Negative for polyphagia and polyuria.  Genitourinary: Negative.  Negative for frequency.  Musculoskeletal: Negative.  Negative for arthralgias.  Skin: Negative.  Negative for color change and pallor.  Neurological: Negative.  Negative for dizziness, weakness and light-headedness.  Hematological:  Negative for adenopathy. Does not bruise/bleed easily.  Psychiatric/Behavioral: Negative.     Objective:  BP 110/70 (BP Location: Left Arm, Patient Position: Sitting, Cuff Size: Large)   Pulse (!) 56   Temp 98.2 F (36.8 C) (Oral)   Ht 5\' 11"  (1.803 m)   Wt 171 lb (77.6 kg)   SpO2 98%   BMI 23.85 kg/m   BP Readings from Last 3 Encounters:  09/09/21 110/70  07/02/21 102/70  06/02/21 124/80    Wt Readings from Last 3 Encounters:  09/09/21 171 lb (77.6 kg)  07/02/21 169 lb (76.7 kg)  06/02/21 172 lb (78 kg)    Physical Exam Vitals reviewed.  HENT:     Mouth/Throat:     Mouth: Mucous membranes are moist.  Eyes:     General: No scleral icterus.    Conjunctiva/sclera: Conjunctivae normal.  Cardiovascular:     Rate and Rhythm: Normal rate and regular rhythm.     Heart sounds: No murmur heard. Pulmonary:     Effort: Pulmonary effort is normal.     Breath sounds: No stridor. No wheezing, rhonchi  or rales.  Abdominal:     General: Abdomen is flat.     Palpations: There is no mass.     Tenderness: There is no abdominal tenderness. There is no guarding.  Musculoskeletal:        General: Normal range of motion.     Cervical back: Neck supple.     Right lower leg: No edema.     Left lower leg: No edema.  Lymphadenopathy:     Cervical: No cervical adenopathy.  Skin:    General: Skin is warm and dry.  Neurological:     General: No focal deficit present.     Mental Status: He is alert.  Psychiatric:        Mood and Affect: Mood normal.        Behavior:  Behavior normal.    Lab Results  Component Value Date   WBC 5.7 05/29/2021   HGB 14.2 05/29/2021   HCT 41.2 05/29/2021   PLT 287.0 05/29/2021   GLUCOSE 80 09/09/2021   CHOL 250 (H) 05/29/2021   TRIG 246.0 (H) 05/29/2021   HDL 59.80 05/29/2021   LDLDIRECT 56.0 05/29/2021   LDLCALC 93 11/20/2019   ALT 22 02/13/2021   AST 19 02/13/2021   NA 134 (L) 09/09/2021   K 3.9 09/09/2021   CL 100 09/09/2021   CREATININE 1.22 09/09/2021   BUN 18 09/09/2021   CO2 26 09/09/2021   TSH 2.24 02/13/2021   PSA 0.14 02/13/2021    CT CARDIAC SCORING (SELF PAY ONLY)  Addendum Date: 07/29/2021   ADDENDUM REPORT: 07/29/2021 10:52 CLINICAL DATA:  Cardiovascular Disease Risk stratification EXAM: Coronary Calcium Score TECHNIQUE: A gated, non-contrast computed tomography scan of the heart was performed using 28mm slice thickness. Axial images were analyzed on a dedicated workstation. Calcium scoring of the coronary arteries was performed using the Agatston method. FINDINGS: Coronary arteries: Normal origins. Coronary Calcium Score: Left main: 0 Left anterior descending artery: 1 Left circumflex artery: 0 Right coronary artery: 76 Total: 77 Percentile: 80 Pericardium: Normal. Ascending Aorta: Normal caliber. Mild focal aortic valve leaflet sclerosis. Non-cardiac: See separate report from [facility]. IMPRESSION: 1. Coronary calcium score of 77. This was 12 percentile for age-, race-, and sex-matched controls. 2. Mild focal aortic valve leaflet sclerosis. RECOMMENDATIONS: Coronary artery calcium (CAC) score is a strong predictor of incident coronary heart disease (CHD) and provides predictive information beyond traditional risk factors. CAC scoring is reasonable to use in the decision to withhold, postpone, or initiate statin therapy in intermediate-risk or selected borderline-risk asymptomatic adults (age 51-75 years and LDL-C >=70 to <190 mg/dL) who do not have diabetes or established atherosclerotic  cardiovascular disease (ASCVD).* In intermediate-risk (10-year ASCVD risk >=7.5% to <20%) adults or selected borderline-risk (10-year ASCVD risk >=5% to <7.5%) adults in whom a CAC score is measured for the purpose of making a treatment decision the following recommendations have been made: If CAC=0, it is reasonable to withhold statin therapy and reassess in 5 to 10 years, as long as higher risk conditions are absent (diabetes mellitus, family history of premature CHD in first degree relatives (males <55 years; females <65 years), cigarette smoking, or LDL >=190 mg/dL). If CAC is 1 to 99, it is reasonable to initiate statin therapy for patients >=15 years of age. If CAC is >=100 or >=75th percentile, it is reasonable to initiate statin therapy at any age. Cardiology referral should be considered for patients with CAC scores >=400 or >=75th percentile. *2018 AHA/ACC/AACVPR/AAPA/ABC/ACPM/ADA/AGS/APhA/ASPC/NLA/PCNA Guideline on the  Management of Blood Cholesterol: A Report of the [employer]/American Heart Association Task Force on Clinical Practice Guidelines. J Am Coll Cardiol. 2019;73(24):3168-3209. Electronically Signed   By: Donato Schultz MD   On: 07/29/2021 10:52   Result Date: 07/29/2021 EXAM: OVER-READ INTERPRETATION  CT CHEST The following report is an over-read performed by radiologist Dr. Irish Lack of [facility] on 07/29/2021. This over-read does not include interpretation of cardiac or coronary anatomy or pathology. The coronary calcium score interpretation by the cardiologist is attached. COMPARISON:  None. FINDINGS: Vascular: Mild focal calcification associated with the aortic valve. Mediastinum/Nodes: Visualized mediastinum and hilar regions demonstrate no lymphadenopathy or masses. Lungs/Pleura: There is no evidence of pulmonary edema, consolidation, pneumothorax, nodule or pleural fluid. Upper Abdomen: No acute abnormality. Musculoskeletal: No chest wall mass or  suspicious bone lesions identified. IMPRESSION: Focal calcification associated with the aortic valve. Electronically Signed: By: Irish Lack M.D. On: 07/29/2021 08:44    Assessment & Plan:   Hamilton was seen today for hypertension.  Diagnoses and all orders for this visit:  Essential hypertension- His blood pressure is adequately well controlled with lifestyle modifications. -     Basic metabolic panel; Future -     Cortisol; Future -     Sodium, urine, random; Future -     Sodium, urine, random -     Basic metabolic panel -     Cortisol  Chronic hyponatremia- His sodium level is mildly low but it stable.  Screening for adrenal insufficiency is negative.  His urine sodium is low.  This is consistent with psychogenic polydipsia.  I have asked him to decrease his water intake by 50%. -     Basic metabolic panel; Future -     Cortisol; Future -     Sodium, urine, random; Future -     Sodium, urine, random -     Basic metabolic panel -     Cortisol  Colon cancer screening -     Cologuard  Other orders -     Flu Vaccine QUAD 6+ mos PF IM (Fluarix Quad PF)  I am having Latanya Maudlin maintain his citalopram, Vitamin D (Ergocalciferol), fluticasone, finasteride, atorvastatin, levocetirizine, omega-3 acid ethyl esters, and neomycin-polymyxin b-dexamethasone.  No orders of the defined types were placed in this encounter.    Follow-up: Return in about 6 months (around 03/09/2022).  Sanda Linger, MD

## 2021-09-10 LAB — SODIUM, URINE, RANDOM: Sodium, Ur: 21 mmol/L — ABNORMAL LOW (ref 28–272)

## 2021-09-24 LAB — COLOGUARD: Cologuard: NEGATIVE

## 2021-10-07 ENCOUNTER — Ambulatory Visit: Attending: Internal Medicine

## 2021-10-07 ENCOUNTER — Other Ambulatory Visit (HOSPITAL_BASED_OUTPATIENT_CLINIC_OR_DEPARTMENT_OTHER): Payer: Self-pay

## 2021-10-07 DIAGNOSIS — Z23 Encounter for immunization: Secondary | ICD-10-CM

## 2021-10-07 MED ORDER — PFIZER COVID-19 VAC BIVALENT 30 MCG/0.3ML IM SUSP
INTRAMUSCULAR | 0 refills | Status: DC
  Filled 2021-10-07: qty 0.3, 1d supply, fill #0

## 2021-10-07 NOTE — Progress Notes (Signed)
   Covid-19 Vaccination Clinic  Name:  Angel Combs    MRN: [id number] DOB: May 19, 1966  10/07/2021  Mr. Cherian was observed post Covid-19 immunization for 15 minutes without incident. He was provided with Vaccine Information Sheet and instruction to access the V-Safe system.   Mr. Thone was instructed to call 911 with any severe reactions post vaccine: Difficulty breathing  Swelling of face and throat  A fast heartbeat  A bad rash all over body  Dizziness and weakness

## 2021-10-25 ENCOUNTER — Other Ambulatory Visit: Payer: Self-pay | Admitting: Family Medicine

## 2021-11-06 ENCOUNTER — Other Ambulatory Visit: Payer: Self-pay | Admitting: Internal Medicine

## 2021-11-06 DIAGNOSIS — F32A Depression, unspecified: Secondary | ICD-10-CM

## 2022-01-17 ENCOUNTER — Other Ambulatory Visit: Payer: Self-pay | Admitting: Family Medicine

## 2022-01-25 ENCOUNTER — Other Ambulatory Visit: Payer: Self-pay | Admitting: Internal Medicine

## 2022-01-25 DIAGNOSIS — J301 Allergic rhinitis due to pollen: Secondary | ICD-10-CM

## 2022-02-10 ENCOUNTER — Other Ambulatory Visit: Payer: Self-pay | Admitting: Internal Medicine

## 2022-02-10 DIAGNOSIS — J301 Allergic rhinitis due to pollen: Secondary | ICD-10-CM

## 2022-02-10 DIAGNOSIS — E781 Pure hyperglyceridemia: Secondary | ICD-10-CM

## 2022-02-10 DIAGNOSIS — L659 Nonscarring hair loss, unspecified: Secondary | ICD-10-CM

## 2022-02-10 DIAGNOSIS — E782 Mixed hyperlipidemia: Secondary | ICD-10-CM

## 2022-04-06 ENCOUNTER — Other Ambulatory Visit: Payer: Self-pay | Admitting: Family Medicine

## 2022-05-10 ENCOUNTER — Other Ambulatory Visit: Payer: Self-pay | Admitting: Internal Medicine

## 2022-05-10 DIAGNOSIS — F32A Depression, unspecified: Secondary | ICD-10-CM

## 2022-07-06 ENCOUNTER — Other Ambulatory Visit: Payer: Self-pay | Admitting: Family Medicine

## 2022-08-12 ENCOUNTER — Other Ambulatory Visit: Payer: Self-pay | Admitting: Internal Medicine

## 2022-08-12 ENCOUNTER — Other Ambulatory Visit: Payer: Self-pay | Admitting: Family Medicine

## 2022-08-12 DIAGNOSIS — J301 Allergic rhinitis due to pollen: Secondary | ICD-10-CM

## 2022-08-13 ENCOUNTER — Other Ambulatory Visit: Payer: Self-pay

## 2022-08-13 DIAGNOSIS — M2550 Pain in unspecified joint: Secondary | ICD-10-CM

## 2022-08-17 ENCOUNTER — Encounter: Payer: Self-pay | Admitting: Internal Medicine

## 2022-08-17 ENCOUNTER — Other Ambulatory Visit: Payer: Self-pay | Admitting: Internal Medicine

## 2022-08-17 MED ORDER — SCOPOLAMINE 1 MG/3DAYS TD PT72: 1.0000 | MEDICATED_PATCH | TRANSDERMAL | 0 refills | Status: DC

## 2022-09-14 ENCOUNTER — Ambulatory Visit: Admitting: Internal Medicine

## 2022-09-16 ENCOUNTER — Ambulatory Visit: Admitting: Internal Medicine

## 2022-10-21 ENCOUNTER — Ambulatory Visit: Admitting: Internal Medicine

## 2022-11-03 ENCOUNTER — Encounter: Payer: Self-pay | Admitting: Internal Medicine

## 2022-11-03 ENCOUNTER — Other Ambulatory Visit: Payer: Self-pay

## 2022-11-03 ENCOUNTER — Ambulatory Visit: Admitting: Internal Medicine

## 2022-11-03 VITALS — BP 132/78 | HR 60 | Temp 98.4°F | Resp 16 | Ht 71.0 in | Wt 172.0 lb

## 2022-11-03 DIAGNOSIS — E781 Pure hyperglyceridemia: Secondary | ICD-10-CM | POA: Diagnosis not present

## 2022-11-03 DIAGNOSIS — Z0001 Encounter for general adult medical examination with abnormal findings: Secondary | ICD-10-CM | POA: Diagnosis not present

## 2022-11-03 DIAGNOSIS — R001 Bradycardia, unspecified: Secondary | ICD-10-CM | POA: Insufficient documentation

## 2022-11-03 DIAGNOSIS — E871 Hypo-osmolality and hyponatremia: Secondary | ICD-10-CM | POA: Diagnosis not present

## 2022-11-03 DIAGNOSIS — I10 Essential (primary) hypertension: Secondary | ICD-10-CM | POA: Diagnosis not present

## 2022-11-03 DIAGNOSIS — E291 Testicular hypofunction: Secondary | ICD-10-CM | POA: Diagnosis not present

## 2022-11-03 DIAGNOSIS — E559 Vitamin D deficiency, unspecified: Secondary | ICD-10-CM | POA: Diagnosis not present

## 2022-11-03 DIAGNOSIS — Z23 Encounter for immunization: Secondary | ICD-10-CM | POA: Diagnosis not present

## 2022-11-03 LAB — CBC WITH DIFFERENTIAL/PLATELET
Basophils Absolute: 0 10*3/uL (ref 0.0–0.1)
Basophils Relative: 0.4 % (ref 0.0–3.0)
Eosinophils Absolute: 0.1 10*3/uL (ref 0.0–0.7)
Eosinophils Relative: 1.7 % (ref 0.0–5.0)
HCT: 40.5 % (ref 39.0–52.0)
Hemoglobin: 13.9 g/dL (ref 13.0–17.0)
Lymphocytes Relative: 39.8 % (ref 12.0–46.0)
Lymphs Abs: 2 10*3/uL (ref 0.7–4.0)
MCHC: 34.3 g/dL (ref 30.0–36.0)
MCV: 92.2 fl (ref 78.0–100.0)
Monocytes Absolute: 0.4 10*3/uL (ref 0.1–1.0)
Monocytes Relative: 7.4 % (ref 3.0–12.0)
Neutro Abs: 2.5 10*3/uL (ref 1.4–7.7)
Neutrophils Relative %: 50.7 % (ref 43.0–77.0)
Platelets: 237 10*3/uL (ref 150.0–400.0)
RBC: 4.4 Mil/uL (ref 4.22–5.81)
RDW: 12.9 % (ref 11.5–15.5)
WBC: 5 10*3/uL (ref 4.0–10.5)

## 2022-11-03 LAB — LIPID PANEL
Cholesterol: 213 mg/dL — ABNORMAL HIGH (ref 0–200)
HDL: 52 mg/dL (ref >39.00)
NonHDL: 161.11
Total CHOL/HDL Ratio: 4
Triglycerides: 230 mg/dL — ABNORMAL HIGH (ref 0.0–149.0)
VLDL: 46 mg/dL — ABNORMAL HIGH (ref 0.0–40.0)

## 2022-11-03 LAB — BASIC METABOLIC PANEL
BUN: 15 mg/dL (ref 6–23)
CO2: 27 mEq/L (ref 19–32)
Calcium: 9.3 mg/dL (ref 8.4–10.5)
Chloride: 100 mEq/L (ref 96–112)
Creatinine, Ser: 1.4 mg/dL (ref 0.40–1.50)
GFR: 56.93 mL/min — ABNORMAL LOW (ref >60.00)
Glucose, Bld: 92 mg/dL (ref 70–99)
Potassium: 4.1 mEq/L (ref 3.5–5.1)
Sodium: 134 mEq/L — ABNORMAL LOW (ref 135–145)

## 2022-11-03 LAB — URINALYSIS, ROUTINE W REFLEX MICROSCOPIC
Bilirubin Urine: NEGATIVE
Hgb urine dipstick: NEGATIVE
Ketones, ur: NEGATIVE
Leukocytes,Ua: NEGATIVE
Nitrite: NEGATIVE
RBC / HPF: NONE SEEN (ref 0–?)
Specific Gravity, Urine: <=1.005 — AB (ref 1.000–1.030)
Total Protein, Urine: NEGATIVE
Urine Glucose: NEGATIVE
Urobilinogen, UA: 0.2 (ref 0.0–1.0)
pH: 6 (ref 5.0–8.0)

## 2022-11-03 LAB — VITAMIN D 25 HYDROXY (VIT D DEFICIENCY, FRACTURES): VITD: 33.11 ng/mL (ref 30.00–100.00)

## 2022-11-03 LAB — HEPATIC FUNCTION PANEL
ALT: 19 U/L (ref 0–53)
AST: 20 U/L (ref 0–37)
Albumin: 4.4 g/dL (ref 3.5–5.2)
Alkaline Phosphatase: 52 U/L (ref 39–117)
Bilirubin, Direct: 0.1 mg/dL (ref 0.0–0.3)
Total Bilirubin: 0.4 mg/dL (ref 0.2–1.2)
Total Protein: 7.2 g/dL (ref 6.0–8.3)

## 2022-11-03 LAB — LUTEINIZING HORMONE: LH: 2.37 m[IU]/mL (ref 1.50–9.30)

## 2022-11-03 LAB — TSH: TSH: 1.97 u[IU]/mL (ref 0.35–5.50)

## 2022-11-03 LAB — PSA: PSA: 0.1 ng/mL (ref 0.10–4.00)

## 2022-11-03 LAB — FOLLICLE STIMULATING HORMONE: FSH: 3.9 m[IU]/mL (ref 1.4–18.1)

## 2022-11-03 LAB — LDL CHOLESTEROL, DIRECT: Direct LDL: 64 mg/dL

## 2022-11-03 MED ORDER — VITAMIN D (ERGOCALCIFEROL) 1.25 MG (50000 UNIT) PO CAPS: 50000.0000 [IU] | ORAL_CAPSULE | ORAL | 0 refills | Status: DC

## 2022-11-03 MED ORDER — OMEGA-3-ACID ETHYL ESTERS 1 G PO CAPS: 2.0000 | ORAL_CAPSULE | Freq: Two times a day (BID) | ORAL | 1 refills | Status: DC

## 2022-11-03 NOTE — Progress Notes (Signed)
Subjective:  Patient ID: Angel Combs, male    DOB: Sep 04, 1966  Age: 56 y.o. MRN: [id number]  CC: Annual Exam, Hypertension, and Hyperlipidemia   HPI Pericles Carmicheal presents for a CPX and f/up -  He complains of chronic fatigue and is concerned his testosterone level is low.  He runs about 3 miles a day and has good endurance.  He denies chest pain, shortness of breath, diaphoresis, or edema.  Outpatient Medications Prior to Visit  Medication Sig Dispense Refill   atorvastatin (LIPITOR) 10 MG tablet TAKE 1 TABLET BY MOUTH EVERY DAY 90 tablet 1   citalopram (CELEXA) 10 MG tablet TAKE 1 TABLET BY MOUTH EVERY DAY 90 tablet 1   finasteride (PROSCAR) 5 MG tablet TAKE 1 TABLET (5 MG TOTAL) BY MOUTH DAILY. 90 tablet 1   fluticasone (FLONASE) 50 MCG/ACT nasal spray SHAKE LIQUID AND USE 2 SPRAYS IN EACH NOSTRIL DAILY 48 mL 1   levocetirizine (XYZAL) 5 MG tablet TAKE 1 TABLET BY MOUTH EVERY DAY IN THE EVENING 90 tablet 1   omega-3 acid ethyl esters (LOVAZA) 1 g capsule TAKE 2 CAPSULES BY MOUTH TWICE A DAY 360 capsule 1   scopolamine (TRANSDERM-SCOP) 1 MG/3DAYS Place 1 patch (1.5 mg total) onto the skin every 3 (three) days. 10 patch 0   Vitamin D, Ergocalciferol, (DRISDOL) 1.25 MG (50000 UNIT) CAPS capsule TAKE 1 CAPSULE BY MOUTH ONE TIME PER WEEK 12 capsule 0   No facility-administered medications prior to visit.    ROS Review of Systems  Constitutional:  Positive for fatigue. Negative for diaphoresis.  HENT: Negative.    Eyes: Negative.   Respiratory:  Negative for cough, chest tightness and shortness of breath.   Cardiovascular:  Negative for chest pain, palpitations and leg swelling.  Gastrointestinal:  Negative for abdominal pain and diarrhea.  Endocrine: Negative.   Genitourinary: Negative.  Negative for difficulty urinating, dysuria and scrotal swelling.  Musculoskeletal: Negative.   Skin: Negative.   Neurological: Negative.  Negative for dizziness, speech difficulty, weakness and  light-headedness.  Hematological: Negative.   Psychiatric/Behavioral: Negative.      Objective:  BP 132/78 (BP Location: Right Arm, Patient Position: Sitting, Cuff Size: Large)   Pulse 60   Temp 98.4 F (36.9 C) (Oral)   Resp 16   Ht 5\' 11"  (1.803 m)   Wt 172 lb (78 kg)   SpO2 99%   BMI 23.99 kg/m   BP Readings from Last 3 Encounters:  11/03/22 132/78  09/09/21 110/70  07/02/21 102/70    Wt Readings from Last 3 Encounters:  11/03/22 172 lb (78 kg)  09/09/21 171 lb (77.6 kg)  07/02/21 169 lb (76.7 kg)    Physical Exam Vitals reviewed.  HENT:     Mouth/Throat:     Mouth: Mucous membranes are moist.  Eyes:     General: No scleral icterus.    Conjunctiva/sclera: Conjunctivae normal.  Cardiovascular:     Rate and Rhythm: Normal rate and regular rhythm.     Heart sounds: Normal heart sounds, S1 normal and S2 normal. No murmur heard.    Comments: EKG- NSR, 60 bpm Flat/inverted T waves in V1-V6 are slightly more prominent Pulmonary:     Effort: Pulmonary effort is normal.     Breath sounds: No stridor. No wheezing, rhonchi or rales.  Abdominal:     General: Abdomen is flat.     Palpations: There is no mass.     Tenderness: There is no abdominal tenderness. There is  no guarding.     Hernia: No hernia is present. There is no hernia in the left inguinal area or right inguinal area.  Genitourinary:    Pubic Area: No rash.      Penis: Normal and circumcised.      Testes: Normal.     Epididymis:     Right: Normal.     Left: Normal.     Prostate: Normal. Not enlarged, not tender and no nodules present.     Rectum: Normal. Guaiac result negative. No mass, tenderness, anal fissure, external hemorrhoid or internal hemorrhoid. Normal anal tone.  Musculoskeletal:     Cervical back: Neck supple.     Right lower leg: No edema.     Left lower leg: No edema.  Lymphadenopathy:     Lower Body: No right inguinal adenopathy. No left inguinal adenopathy.  Skin:    General: Skin  is warm and dry.  Neurological:     General: No focal deficit present.     Mental Status: He is alert.  Psychiatric:        Mood and Affect: Mood normal.        Behavior: Behavior normal.     Lab Results  Component Value Date   WBC 5.0 11/03/2022   HGB 13.9 11/03/2022   HCT 40.5 11/03/2022   PLT 237.0 11/03/2022   GLUCOSE 92 11/03/2022   CHOL 213 (H) 11/03/2022   TRIG 230.0 (H) 11/03/2022   HDL 52.00 11/03/2022   LDLDIRECT 64.0 11/03/2022   LDLCALC 93 11/20/2019   ALT 19 11/03/2022   AST 20 11/03/2022   NA 134 (L) 11/03/2022   K 4.1 11/03/2022   CL 100 11/03/2022   CREATININE 1.40 11/03/2022   BUN 15 11/03/2022   CO2 27 11/03/2022   TSH 1.97 11/03/2022   PSA 0.10 11/03/2022    CT CARDIAC SCORING (SELF PAY ONLY)  Addendum Date: 07/29/2021   ADDENDUM REPORT: 07/29/2021 10:52 CLINICAL DATA:  Cardiovascular Disease Risk stratification EXAM: Coronary Calcium Score TECHNIQUE: A gated, non-contrast computed tomography scan of the heart was performed using 74mm slice thickness. Axial images were analyzed on a dedicated workstation. Calcium scoring of the coronary arteries was performed using the Agatston method. FINDINGS: Coronary arteries: Normal origins. Coronary Calcium Score: Left main: 0 Left anterior descending artery: 1 Left circumflex artery: 0 Right coronary artery: 76 Total: 77 Percentile: 80 Pericardium: Normal. Ascending Aorta: Normal caliber. Mild focal aortic valve leaflet sclerosis. Non-cardiac: See separate report from [facility]. IMPRESSION: 1. Coronary calcium score of 77. This was 33 percentile for age-, race-, and sex-matched controls. 2. Mild focal aortic valve leaflet sclerosis. RECOMMENDATIONS: Coronary artery calcium (CAC) score is a strong predictor of incident coronary heart disease (CHD) and provides predictive information beyond traditional risk factors. CAC scoring is reasonable to use in the decision to withhold, postpone, or initiate statin therapy  in intermediate-risk or selected borderline-risk asymptomatic adults (age 10-75 years and LDL-C >=70 to <190 mg/dL) who do not have diabetes or established atherosclerotic cardiovascular disease (ASCVD).* In intermediate-risk (10-year ASCVD risk >=7.5% to <20%) adults or selected borderline-risk (10-year ASCVD risk >=5% to <7.5%) adults in whom a CAC score is measured for the purpose of making a treatment decision the following recommendations have been made: If CAC=0, it is reasonable to withhold statin therapy and reassess in 5 to 10 years, as long as higher risk conditions are absent (diabetes mellitus, family history of premature CHD in first degree relatives (males <55 years; females <65  years), cigarette smoking, or LDL >=190 mg/dL). If CAC is 1 to 99, it is reasonable to initiate statin therapy for patients >=59 years of age. If CAC is >=100 or >=75th percentile, it is reasonable to initiate statin therapy at any age. Cardiology referral should be considered for patients with CAC scores >=400 or >=75th percentile. *2018 AHA/ACC/AACVPR/AAPA/ABC/ACPM/ADA/AGS/APhA/ASPC/NLA/PCNA Guideline on the Management of Blood Cholesterol: A Report of the American College of Cardiology/American Heart Association Task Force on Clinical Practice Guidelines. J Am Coll Cardiol. 2019;73(24):3168-3209. Electronically Signed   By: Donato Schultz MD   On: 07/29/2021 10:52   Result Date: 07/29/2021 EXAM: OVER-READ INTERPRETATION  CT CHEST The following report is an over-read performed by radiologist Dr. Irish Lack of [facility] on 07/29/2021. This over-read does not include interpretation of cardiac or coronary anatomy or pathology. The coronary calcium score interpretation by the cardiologist is attached. COMPARISON:  None. FINDINGS: Vascular: Mild focal calcification associated with the aortic valve. Mediastinum/Nodes: Visualized mediastinum and hilar regions demonstrate no lymphadenopathy or masses. Lungs/Pleura:  There is no evidence of pulmonary edema, consolidation, pneumothorax, nodule or pleural fluid. Upper Abdomen: No acute abnormality. Musculoskeletal: No chest wall mass or suspicious bone lesions identified. IMPRESSION: Focal calcification associated with the aortic valve. Electronically Signed: By: Irish Lack M.D. On: 07/29/2021 08:44    Assessment & Plan:   Colman was seen today for annual exam, hypertension and hyperlipidemia.  Diagnoses and all orders for this visit:  Essential hypertension- His blood pressure is well controlled.  EKG is negative for LVH. -     CBC with Differential/Platelet; Future -     Hepatic function panel; Future -     Urinalysis, Routine w reflex microscopic; Future -     TSH; Future -     Basic metabolic panel; Future -     EKG 12-Lead -     Basic metabolic panel -     TSH -     Urinalysis, Routine w reflex microscopic -     Hepatic function panel -     CBC with Differential/Platelet  Pure hyperglyceridemia -     Lipid panel; Future -     Hepatic function panel; Future -     omega-3 acid ethyl esters (LOVAZA) 1 g capsule; Take 2 capsules (2 g total) by mouth 2 (two) times daily. -     Hepatic function panel -     Lipid panel  Chronic hyponatremia- This is stable. -     Sodium, urine, random; Future -     Basic metabolic panel; Future -     Basic metabolic panel -     Sodium, urine, random  Encounter for general adult medical examination with abnormal findings- Exam completed, labs reviewed, vaccines reviewed and updated, cancer screenings are up-to-date, patient education was given. -     PSA; Future -     PSA  Vitamin D deficiency disease -     VITAMIN D 25 Hydroxy (Vit-D Deficiency, Fractures); Future -     VITAMIN D 25 Hydroxy (Vit-D Deficiency, Fractures) -     Vitamin D, Ergocalciferol, (DRISDOL) 1.25 MG (50000 UNIT) CAPS capsule; Take 1 capsule (50,000 Units total) by mouth every 7 (seven) days.  Hypogonadism in male- His  testosterone, FSH, and LH are normal. -     Testosterone Total,Free,Bio, Males; Future -     Luteinizing hormone; Future -     FSH; Future -     FSH -  Luteinizing hormone -     Testosterone Total,Free,Bio, Males  Other orders -     Tdap vaccine greater than or equal to 7yo IM -     LDL cholesterol, direct   I have discontinued [employer] scopolamine. I have also changed his omega-3 acid ethyl esters and Vitamin D (Ergocalciferol). Additionally, I am having him maintain his fluticasone, atorvastatin, finasteride, citalopram, and levocetirizine.  Meds ordered this encounter  Medications   omega-3 acid ethyl esters (LOVAZA) 1 g capsule    Sig: Take 2 capsules (2 g total) by mouth 2 (two) times daily.    Dispense:  360 capsule    Refill:  1   Vitamin D, Ergocalciferol, (DRISDOL) 1.25 MG (50000 UNIT) CAPS capsule    Sig: Take 1 capsule (50,000 Units total) by mouth every 7 (seven) days.    Dispense:  12 capsule    Refill:  0     Follow-up: Return in about 6 months (around 05/04/2023).  Scarlette Calico, MD

## 2022-11-03 NOTE — Patient Instructions (Signed)
Health Maintenance, Male Adopting a healthy lifestyle and getting preventive care are important in promoting health and wellness. Ask your health care provider about: The right schedule for you to have regular tests and exams. Things you can do on your own to prevent diseases and keep yourself healthy. What should I know about diet, weight, and exercise? Eat a healthy diet  Eat a diet that includes plenty of vegetables, fruits, low-fat dairy products, and lean protein. Do not eat a lot of foods that are high in solid fats, added sugars, or sodium. Maintain a healthy weight Body mass index (BMI) is a measurement that can be used to identify possible weight problems. It estimates body fat based on height and weight. Your health care provider can help determine your BMI and help you achieve or maintain a healthy weight. Get regular exercise Get regular exercise. This is one of the most important things you can do for your health. Most adults should: Exercise for at least 150 minutes each week. The exercise should increase your heart rate and make you sweat (moderate-intensity exercise). Do strengthening exercises at least twice a week. This is in addition to the moderate-intensity exercise. Spend less time sitting. Even light physical activity can be beneficial. Watch cholesterol and blood lipids Have your blood tested for lipids and cholesterol at 54 years of age, then have this test every 5 years. You may need to have your cholesterol levels checked more often if: Your lipid or cholesterol levels are high. You are older than 54 years of age. You are at high risk for heart disease. What should I know about cancer screening? Many types of cancers can be detected early and may often be prevented. Depending on your health history and family history, you may need to have cancer screening at various ages. This may include screening for: Colorectal cancer. Prostate cancer. Skin cancer. Lung  cancer. What should I know about heart disease, diabetes, and high blood pressure? Blood pressure and heart disease High blood pressure causes heart disease and increases the risk of stroke. This is more likely to develop in people who have high blood pressure readings or are overweight. Talk with your health care provider about your target blood pressure readings. Have your blood pressure checked: Every 3-5 years if you are 18-39 years of age. Every year if you are 40 years old or older. If you are between the ages of 65 and 75 and are a current or former smoker, ask your health care provider if you should have a one-time screening for abdominal aortic aneurysm (AAA). Diabetes Have regular diabetes screenings. This checks your fasting blood sugar level. Have the screening done: Once every three years after age 45 if you are at a normal weight and have a low risk for diabetes. More often and at a younger age if you are overweight or have a high risk for diabetes. What should I know about preventing infection? Hepatitis B If you have a higher risk for hepatitis B, you should be screened for this virus. Talk with your health care provider to find out if you are at risk for hepatitis B infection. Hepatitis C Blood testing is recommended for: Everyone born from 1945 through 1965. Anyone with known risk factors for hepatitis C. Sexually transmitted infections (STIs) You should be screened each year for STIs, including gonorrhea and chlamydia, if: You are sexually active and are younger than 54 years of age. You are older than 54 years of age and your   health care provider tells you that you are at risk for this type of infection. Your sexual activity has changed since you were last screened, and you are at increased risk for chlamydia or gonorrhea. Ask your health care provider if you are at risk. Ask your health care provider about whether you are at high risk for HIV. Your health care provider  may recommend a prescription medicine to help prevent HIV infection. If you choose to take medicine to prevent HIV, you should first get tested for HIV. You should then be tested every 3 months for as long as you are taking the medicine. Follow these instructions at home: Alcohol use Do not drink alcohol if your health care provider tells you not to drink. If you drink alcohol: Limit how much you have to 0-2 drinks a day. Know how much alcohol is in your drink. In the [place] one drink equals one 12 oz bottle of beer (355 mL), one 5 oz glass of wine (148 mL), or one 1 oz glass of hard liquor (44 mL). Lifestyle Do not use any products that contain nicotine or tobacco. These products include cigarettes, chewing tobacco, and vaping devices, such as e-cigarettes. If you need help quitting, ask your health care provider. Do not use street drugs. Do not share needles. Ask your health care provider for help if you need support or information about quitting drugs. General instructions Schedule regular health, dental, and eye exams. Stay current with your vaccines. Tell your health care provider if: You often feel depressed. You have ever been abused or do not feel safe at home. Summary Adopting a healthy lifestyle and getting preventive care are important in promoting health and wellness. Follow your health care provider's instructions about healthy diet, exercising, and getting tested or screened for diseases. Follow your health care provider's instructions on monitoring your cholesterol and blood pressure. This information is not intended to replace advice given to you by your health care provider. Make sure you discuss any questions you have with your health care provider. Document Revised: 05/05/2021 Document Reviewed: 05/05/2021 Elsevier Patient Education  2023 Elsevier Inc.  

## 2022-11-04 LAB — TESTOSTERONE TOTAL,FREE,BIO, MALES
Albumin: 4.5 g/dL (ref 3.6–5.1)
Sex Hormone Binding: 34 nmol/L (ref 10–50)
Testosterone, Bioavailable: 110.1 ng/dL (ref 110.0–575.0)
Testosterone, Free: 53.5 pg/mL (ref 46.0–224.0)
Testosterone: 417 ng/dL (ref 250–827)

## 2022-11-04 LAB — SODIUM, URINE, RANDOM: Sodium, Ur: 43 mmol/L (ref 28–272)

## 2023-01-14 ENCOUNTER — Other Ambulatory Visit: Payer: Self-pay | Admitting: Internal Medicine

## 2023-01-14 DIAGNOSIS — J301 Allergic rhinitis due to pollen: Secondary | ICD-10-CM

## 2023-01-14 DIAGNOSIS — E559 Vitamin D deficiency, unspecified: Secondary | ICD-10-CM

## 2023-03-29 ENCOUNTER — Ambulatory Visit: Admitting: Sports Medicine

## 2023-03-29 VITALS — BP 110/80 | HR 64 | Ht 71.0 in | Wt 175.0 lb

## 2023-03-29 DIAGNOSIS — M25561 Pain in right knee: Secondary | ICD-10-CM | POA: Diagnosis not present

## 2023-03-29 MED ORDER — MELOXICAM 15 MG PO TABS: 15.0000 mg | ORAL_TABLET | Freq: Every day | ORAL | 0 refills | Status: DC

## 2023-03-29 NOTE — Progress Notes (Signed)
Benito Mccreedy D.[facility] Phone: [phone]   Assessment and Plan:     1. Acute pain of right knee -Acute, uncomplicated, initial sports medicine visit - Acute pain of right knee after patient's dog ran into his knee, and pain has been intermittent since that time.  I think that the contusion to right knee has led to an intra-articular flare that is waxing waning, however has had difficulty fully resolving with intermittent NSAID use and rest. - No red flag signs on physical exam, so no imaging at today's visit - Start meloxicam 15 mg daily x2 weeks.  If still having pain after 2 weeks, complete 3rd-week of meloxicam. May use remaining meloxicam as needed once daily for pain control.  Do not to use additional NSAIDs while taking meloxicam.  May use Tylenol [phone] mg 2 to 3 times a day for breakthrough pain. - Start HEP for knee  Other orders - meloxicam (MOBIC) 15 MG tablet; Take 1 tablet (15 mg total) by mouth daily.    Pertinent previous records reviewed include none   Follow Up: As needed if no improvement or worsening of symptoms.  Patient's vacation will include a significant amount of walking which may further flare symptoms, so could consider x-ray and CSI if pain continues after   Subjective:   I, [name], am serving as a Education administrator for Doctor Glennon Mac  Chief Complaint: right knee pain   HPI:   03/29/23 Patient is a 57 year old male complaining of right knee pain. Patient states that a week ago his dog ran into him , then the pain went away went for a run last Wednesday and the pain came back he took a diclofenac and he had instant relief for 48 hours he used the cream as well, pain is now back the pain radiates through the whole knee, pain is worse at night , ice helps, [place], no numbness or tingling, going up and down steps is uncomfortable,   Relevant  Historical Information: Hypertension  Additional pertinent review of systems negative.   Current Outpatient Medications:    atorvastatin (LIPITOR) 10 MG tablet, TAKE 1 TABLET BY MOUTH EVERY DAY, Disp: 90 tablet, Rfl: 1   citalopram (CELEXA) 10 MG tablet, TAKE 1 TABLET BY MOUTH EVERY DAY, Disp: 90 tablet, Rfl: 1   finasteride (PROSCAR) 5 MG tablet, TAKE 1 TABLET (5 MG TOTAL) BY MOUTH DAILY., Disp: 90 tablet, Rfl: 1   fluticasone (FLONASE) 50 MCG/ACT nasal spray, SHAKE LIQUID AND USE 2 SPRAYS IN EACH NOSTRIL DAILY, Disp: 48 mL, Rfl: 1   levocetirizine (XYZAL) 5 MG tablet, TAKE 1 TABLET BY MOUTH EVERY DAY IN THE EVENING, Disp: 90 tablet, Rfl: 1   meloxicam (MOBIC) 15 MG tablet, Take 1 tablet (15 mg total) by mouth daily., Disp: 30 tablet, Rfl: 0   omega-3 acid ethyl esters (LOVAZA) 1 g capsule, Take 2 capsules (2 g total) by mouth 2 (two) times daily., Disp: 360 capsule, Rfl: 1   Vitamin D, Ergocalciferol, (DRISDOL) 1.25 MG (50000 UNIT) CAPS capsule, TAKE 1 CAPSULE (50,000 UNITS TOTAL) BY MOUTH EVERY 7 (SEVEN) DAYS, Disp: 12 capsule, Rfl: 0   Objective:     Vitals:   03/29/23 0932  BP: 110/80  Pulse: 64  SpO2: 100%  Weight: 175 lb (79.4 kg)  Height: 5\' 11"  (1.803 m)      Body mass index is 24.41 kg/m.  Physical Exam:    General:  awake, alert oriented, no acute distress nontoxic Skin: no suspicious lesions or rashes Neuro:sensation intact and strength 5/5 with no deficits, no atrophy, normal muscle tone Psych: No signs of anxiety, depression or other mood disorder  Right knee: No swelling No deformity Neg fluid wave, joint milking ROM Flex 110, Ext 0 NTTP over the quad tendon, medial fem condyle, lat fem condyle, patella, plica, patella tendon, tibial tuberostiy, fibular head, posterior fossa, pes anserine bursa, gerdy's tubercle, medial jt line, lateral jt line Neg anterior and posterior drawer Neg lachman Neg sag sign Negative varus stress Negative valgus  stress Negative McMurray Negative Thessaly  Gait normal    Electronically signed by:  Benito Mccreedy D.[facility] 9:58 AM 03/29/23

## 2023-03-29 NOTE — Patient Instructions (Addendum)
Good to see you  - Start meloxicam 15 mg daily x2 weeks.  If still having pain after 2 weeks, complete 3rd-week of meloxicam. May use remaining meloxicam as needed once daily for pain control.  Do not to use additional NSAIDs while taking meloxicam.  May use Tylenol [phone] mg 2 to 3 times a day for breakthrough pain. Knee HEP  As needed follow up

## 2023-04-18 ENCOUNTER — Other Ambulatory Visit: Payer: Self-pay | Admitting: Internal Medicine

## 2023-04-18 DIAGNOSIS — E559 Vitamin D deficiency, unspecified: Secondary | ICD-10-CM

## 2023-04-19 ENCOUNTER — Other Ambulatory Visit: Payer: Self-pay | Admitting: Internal Medicine

## 2023-04-19 ENCOUNTER — Telehealth: Payer: Self-pay | Admitting: Family Medicine

## 2023-04-19 DIAGNOSIS — E782 Mixed hyperlipidemia: Secondary | ICD-10-CM

## 2023-04-19 DIAGNOSIS — J301 Allergic rhinitis due to pollen: Secondary | ICD-10-CM

## 2023-04-19 NOTE — Telephone Encounter (Signed)
Patient called requesting a refill on  Vitamin D, Ergocalciferol, (DRISDOL) 1.25 MG (50000 UNIT) CAPS capsule.  Please advise.

## 2023-04-19 NOTE — Telephone Encounter (Signed)
Dr. Yetta Barre is prescribing physician for medication. Sent patient MyChart message to have him contact PCP for refill.

## 2023-04-23 ENCOUNTER — Other Ambulatory Visit: Payer: Self-pay | Admitting: Internal Medicine

## 2023-04-23 DIAGNOSIS — E559 Vitamin D deficiency, unspecified: Secondary | ICD-10-CM

## 2023-04-25 ENCOUNTER — Other Ambulatory Visit: Payer: Self-pay | Admitting: Sports Medicine

## 2023-05-12 ENCOUNTER — Encounter: Payer: Self-pay | Admitting: Internal Medicine

## 2023-05-12 ENCOUNTER — Other Ambulatory Visit: Payer: Self-pay | Admitting: Internal Medicine

## 2023-05-12 ENCOUNTER — Telehealth: Payer: Self-pay

## 2023-05-12 DIAGNOSIS — E559 Vitamin D deficiency, unspecified: Secondary | ICD-10-CM

## 2023-05-12 NOTE — Telephone Encounter (Signed)
Patient would like a refill on the Vit D pharmacy [facility].

## 2023-05-12 NOTE — Telephone Encounter (Signed)
Sent patient a MyChart message with recommendations to contact PCP as they are prescriber of medication.

## 2023-05-20 ENCOUNTER — Ambulatory Visit: Admitting: Internal Medicine

## 2023-06-14 ENCOUNTER — Ambulatory Visit: Admitting: Internal Medicine

## 2023-06-14 ENCOUNTER — Encounter: Payer: Self-pay | Admitting: Internal Medicine

## 2023-06-14 VITALS — BP 124/78 | HR 70 | Temp 98.2°F | Resp 16 | Ht 71.0 in | Wt 175.0 lb

## 2023-06-14 DIAGNOSIS — E781 Pure hyperglyceridemia: Secondary | ICD-10-CM

## 2023-06-14 DIAGNOSIS — E871 Hypo-osmolality and hyponatremia: Secondary | ICD-10-CM | POA: Diagnosis not present

## 2023-06-14 DIAGNOSIS — E782 Mixed hyperlipidemia: Secondary | ICD-10-CM

## 2023-06-14 DIAGNOSIS — I10 Essential (primary) hypertension: Secondary | ICD-10-CM | POA: Diagnosis not present

## 2023-06-14 DIAGNOSIS — E559 Vitamin D deficiency, unspecified: Secondary | ICD-10-CM

## 2023-06-14 NOTE — Progress Notes (Signed)
Subjective:  Patient ID: Angel Combs, male    DOB: 02/03/1966  Age: 57 y.o. MRN: [id number]  CC: Hypertension and Hyperlipidemia   HPI Angel Combs presents for f/up --  Discussed the use of AI scribe software for clinical note transcription with the patient, who gave verbal consent to proceed.  History of Present Illness   The patient, with a history of hyperlipidemia and vitamin D deficiency, presents for a routine check-up and refill of their vitamin D prescription. They have been adhering to their prescribed regimen of atorvastatin and fish oil, both taken every other day, and daily loratadine for allergies. They also take Celexa (citalopram) every other day for mood regulation. They deny any current symptoms or side effects from their medications, including joint pain often associated with statins.  The patient maintains an active lifestyle, running three to four times a week for approximately three to four miles each time. They report good endurance and deny any episodes of dizziness, lightheadedness, or near syncope. They recall a past issue with sudden loss of endurance during exercise, but this has since resolved and they are currently asymptomatic.  In addition to their vitamin D refill, the patient requests a lipid panel as a precautionary measure due to a family history of hyperlipidemia. They deny any chest pain, gastrointestinal issues, or other symptoms. They report feeling well overall.        The patient, with a history of hyperlipidemia and vitamin D deficiency, presents for a routine check-up and refill of their vitamin D prescription. They have been adhering to their prescribed regimen of atorvastatin and fish oil, both taken every other day, and daily loratadine for allergies. They also take Celexa (citalopram) every other day for mood regulation. They deny any current symptoms or side effects from their medications, including joint pain often associated with statins.  The  patient maintains an active lifestyle, running three to four times a week for approximately three to four miles each time. They report good endurance and deny any episodes of dizziness, lightheadedness, or near syncope. They recall a past issue with sudden loss of endurance during exercise, but this has since resolved and they are currently asymptomatic.  In addition to their vitamin D refill, the patient requests a lipid panel as a precautionary measure due to a family history of hyperlipidemia. They deny any chest pain, gastrointestinal issues, or other symptoms. They report feeling well overall.       HPI   Weight trend Wt Readings from Last 3 Encounters:  06/14/23 175 lb (79.4 kg)  03/29/23 175 lb (79.4 kg)  11/03/22 172 lb (78 kg)    Lipid Panel     Component Value Date/Time   CHOL 213 (H) 11/03/2022 1030   CHOL 179 02/14/2014 1227   TRIG 230.0 (H) 11/03/2022 1030   TRIG 154 (H) 02/14/2014 1227   HDL 52.00 11/03/2022 1030   HDL 61 02/14/2014 1227   CHOLHDL 4 11/03/2022 1030   VLDL 46.0 (H) 11/03/2022 1030   LDLCALC 93 11/20/2019 0846   LDLCALC 87 02/14/2014 1227   LDLDIRECT 64.0 11/03/2022 1030    Last metabolic panel Lab Results  Component Value Date   GLUCOSE 92 11/03/2022   NA 134 (L) 11/03/2022   K 4.1 11/03/2022   CL 100 11/03/2022   CO2 27 11/03/2022   BUN 15 11/03/2022   CREATININE 1.40 11/03/2022   GFRNONAA 65 02/12/2009   GFRAA 79 02/12/2009   CALCIUM 9.3 11/03/2022   PROT 7.2 11/03/2022  ALBUMIN 4.4 11/03/2022   BILITOT 0.4 11/03/2022   ALKPHOS 52 11/03/2022   AST 20 11/03/2022   ALT 19 11/03/2022    -----------------------------------------------------------------------------------------    Outpatient Medications Prior to Visit  Medication Sig Dispense Refill   atorvastatin (LIPITOR) 10 MG tablet TAKE 1 TABLET BY MOUTH EVERY DAY 90 tablet 0   citalopram (CELEXA) 10 MG tablet TAKE 1 TABLET BY MOUTH EVERY DAY 90 tablet 1   finasteride (PROSCAR)  5 MG tablet TAKE 1 TABLET (5 MG TOTAL) BY MOUTH DAILY. 90 tablet 1   fluticasone (FLONASE) 50 MCG/ACT nasal spray SHAKE LIQUID AND USE 2 SPRAYS IN EACH NOSTRIL DAILY 48 mL 1   levocetirizine (XYZAL) 5 MG tablet TAKE 1 TABLET BY MOUTH EVERY DAY IN THE EVENING 90 tablet 1   meloxicam (MOBIC) 15 MG tablet Take 1 tablet (15 mg total) by mouth daily. 30 tablet 0   omega-3 acid ethyl esters (LOVAZA) 1 g capsule Take 2 capsules (2 g total) by mouth 2 (two) times daily. 360 capsule 1   Vitamin D, Ergocalciferol, (DRISDOL) 1.25 MG (50000 UNIT) CAPS capsule TAKE 1 CAPSULE (50,000 UNITS TOTAL) BY MOUTH EVERY 7 (SEVEN) DAYS 12 capsule 0   No facility-administered medications prior to visit.    ROS Review of Systems  Constitutional: Negative.  Negative for diaphoresis and fatigue.  HENT: Negative.    Eyes: Negative.   Respiratory: Negative.  Negative for cough, chest tightness, shortness of breath and wheezing.   Cardiovascular:  Negative for chest pain, palpitations and leg swelling.  Gastrointestinal:  Negative for abdominal pain, constipation, diarrhea, nausea and vomiting.  Endocrine: Negative.   Genitourinary: Negative.  Negative for difficulty urinating and dysuria.  Musculoskeletal: Negative.  Negative for arthralgias, joint swelling and myalgias.  Skin: Negative.  Negative for color change.  Neurological:  Negative for dizziness, weakness and headaches.  Hematological:  Negative for adenopathy. Does not bruise/bleed easily.  Psychiatric/Behavioral: Negative.      Objective:  BP 124/78 (BP Location: Left Arm, Patient Position: Sitting, Cuff Size: Large)   Pulse 70   Temp 98.2 F (36.8 C) (Oral)   Ht 5\' 11"  (1.803 m)   Wt 175 lb (79.4 kg)   SpO2 97%   BMI 24.41 kg/m   BP Readings from Last 3 Encounters:  06/14/23 124/78  03/29/23 110/80  11/03/22 132/78    Wt Readings from Last 3 Encounters:  06/14/23 175 lb (79.4 kg)  03/29/23 175 lb (79.4 kg)  11/03/22 172 lb (78 kg)     Physical Exam Vitals reviewed.  Constitutional:      Appearance: Normal appearance.  HENT:     Mouth/Throat:     Mouth: Mucous membranes are moist.  Eyes:     General: No scleral icterus.    Conjunctiva/sclera: Conjunctivae normal.  Cardiovascular:     Rate and Rhythm: Normal rate and regular rhythm.     Heart sounds: No murmur heard.    No friction rub. No gallop.  Pulmonary:     Effort: Pulmonary effort is normal.     Breath sounds: No stridor. No wheezing, rhonchi or rales.  Abdominal:     General: Abdomen is flat.     Palpations: There is no mass.     Tenderness: There is no abdominal tenderness. There is no guarding.     Hernia: No hernia is present.  Musculoskeletal:        General: Normal range of motion.     Cervical back: Neck supple.  Right lower leg: No edema.     Left lower leg: No edema.  Lymphadenopathy:     Cervical: No cervical adenopathy.  Skin:    General: Skin is warm and dry.  Neurological:     General: No focal deficit present.     Mental Status: He is alert. Mental status is at baseline.  Psychiatric:        Mood and Affect: Mood normal.        Behavior: Behavior normal.     Lab Results  Component Value Date   WBC 5.0 11/03/2022   HGB 13.9 11/03/2022   HCT 40.5 11/03/2022   PLT 237.0 11/03/2022   GLUCOSE 92 11/03/2022   CHOL 213 (H) 11/03/2022   TRIG 230.0 (H) 11/03/2022   HDL 52.00 11/03/2022   LDLDIRECT 64.0 11/03/2022   LDLCALC 93 11/20/2019   ALT 19 11/03/2022   AST 20 11/03/2022   NA 134 (L) 11/03/2022   K 4.1 11/03/2022   CL 100 11/03/2022   CREATININE 1.40 11/03/2022   BUN 15 11/03/2022   CO2 27 11/03/2022   TSH 1.97 11/03/2022   PSA 0.10 11/03/2022    CT CARDIAC SCORING (SELF PAY ONLY)  Addendum Date: 07/29/2021   ADDENDUM REPORT: 07/29/2021 10:52 CLINICAL DATA:  Cardiovascular Disease Risk stratification EXAM: Coronary Calcium Score TECHNIQUE: A gated, non-contrast computed tomography scan of the heart was  performed using 3mm slice thickness. Axial images were analyzed on a dedicated workstation. Calcium scoring of the coronary arteries was performed using the Agatston method. FINDINGS: Coronary arteries: Normal origins. Coronary Calcium Score: Left main: 0 Left anterior descending artery: 1 Left circumflex artery: 0 Right coronary artery: 76 Total: 77 Percentile: 80 Pericardium: Normal. Ascending Aorta: Normal caliber. Mild focal aortic valve leaflet sclerosis. Non-cardiac: See separate report from [facility]. IMPRESSION: 1. Coronary calcium score of 77. This was 65 percentile for age-, race-, and sex-matched controls. 2. Mild focal aortic valve leaflet sclerosis. RECOMMENDATIONS: Coronary artery calcium (CAC) score is a strong predictor of incident coronary heart disease (CHD) and provides predictive information beyond traditional risk factors. CAC scoring is reasonable to use in the decision to withhold, postpone, or initiate statin therapy in intermediate-risk or selected borderline-risk asymptomatic adults (age 18-75 years and LDL-C >=70 to <190 mg/dL) who do not have diabetes or established atherosclerotic cardiovascular disease (ASCVD).* In intermediate-risk (10-year ASCVD risk >=7.5% to <20%) adults or selected borderline-risk (10-year ASCVD risk >=5% to <7.5%) adults in whom a CAC score is measured for the purpose of making a treatment decision the following recommendations have been made: If CAC=0, it is reasonable to withhold statin therapy and reassess in 5 to 10 years, as long as higher risk conditions are absent (diabetes mellitus, family history of premature CHD in first degree relatives (males <55 years; females <65 years), cigarette smoking, or LDL >=190 mg/dL). If CAC is 1 to 99, it is reasonable to initiate statin therapy for patients >=69 years of age. If CAC is >=100 or >=75th percentile, it is reasonable to initiate statin therapy at any age. Cardiology referral should be considered for  patients with CAC scores >=400 or >=75th percentile. *2018 AHA/ACC/AACVPR/AAPA/ABC/ACPM/ADA/AGS/APhA/ASPC/NLA/PCNA Guideline on the Management of Blood Cholesterol: A Report of the American College of Cardiology/American Heart Association Task Force on Clinical Practice Guidelines. J Am Coll Cardiol. 2019;73(24):3168-3209. Electronically Signed   By: Donato Schultz MD   On: 07/29/2021 10:52   Result Date: 07/29/2021 EXAM: OVER-READ INTERPRETATION  CT CHEST The following report is an  over-read performed by radiologist Dr. Irish Lack of [facility] on 07/29/2021. This over-read does not include interpretation of cardiac or coronary anatomy or pathology. The coronary calcium score interpretation by the cardiologist is attached. COMPARISON:  None. FINDINGS: Vascular: Mild focal calcification associated with the aortic valve. Mediastinum/Nodes: Visualized mediastinum and hilar regions demonstrate no lymphadenopathy or masses. Lungs/Pleura: There is no evidence of pulmonary edema, consolidation, pneumothorax, nodule or pleural fluid. Upper Abdomen: No acute abnormality. Musculoskeletal: No chest wall mass or suspicious bone lesions identified. IMPRESSION: Focal calcification associated with the aortic valve. Electronically Signed: By: Irish Lack M.D. On: 07/29/2021 08:44    Assessment & Plan:   Chronic hyponatremia- I will monitor his sodium level. -     Basic metabolic panel; Future  Elevated cholesterol with elevated triglycerides- I will check his fasting lipid panel to see if he has achieved his LDL goal. -     Lipid panel; Future  Essential hypertension- His blood pressure is well-controlled. -     Basic metabolic panel; Future -     CBC with Differential/Platelet; Future  Pure hyperglyceridemia -     Lipid panel; Future  Vitamin D deficiency disease -     VITAMIN D 25 Hydroxy (Vit-D Deficiency, Fractures); Future     Follow-up: No follow-ups on file.  Sanda Linger, MD

## 2023-06-18 ENCOUNTER — Telehealth: Payer: Self-pay

## 2023-06-18 NOTE — Telephone Encounter (Signed)
Patient arrived in office around 10:30am requesting to see Dr. Yetta Barre. He was made aware that Dr. Yetta Barre is not in the ofice on Fridays, and we can get him in with another team member but do not have any appointments at this location today. Patient was visible in distress and stated he was having a hard time seeing, with blurred vision and eye pain. He was worried he might be having a stroke or maybe it was a migraine.  His sister was with him and they were instructed to go to the ED or we could call 911 for EMS to get the best evaluation. Appreciative for the advice and no additional questions.

## 2023-06-23 ENCOUNTER — Other Ambulatory Visit (INDEPENDENT_AMBULATORY_CARE_PROVIDER_SITE_OTHER)

## 2023-06-23 DIAGNOSIS — E782 Mixed hyperlipidemia: Secondary | ICD-10-CM | POA: Diagnosis not present

## 2023-06-23 DIAGNOSIS — E559 Vitamin D deficiency, unspecified: Secondary | ICD-10-CM

## 2023-06-23 DIAGNOSIS — I10 Essential (primary) hypertension: Secondary | ICD-10-CM

## 2023-06-23 DIAGNOSIS — E781 Pure hyperglyceridemia: Secondary | ICD-10-CM | POA: Diagnosis not present

## 2023-06-23 DIAGNOSIS — E871 Hypo-osmolality and hyponatremia: Secondary | ICD-10-CM | POA: Diagnosis not present

## 2023-06-23 LAB — BASIC METABOLIC PANEL
BUN: 11 mg/dL (ref 6–23)
CO2: 24 mEq/L (ref 19–32)
Calcium: 9.6 mg/dL (ref 8.4–10.5)
Chloride: 96 mEq/L (ref 96–112)
Creatinine, Ser: 1.27 mg/dL (ref 0.40–1.50)
GFR: 63.71 mL/min (ref >60.00)
Glucose, Bld: 82 mg/dL (ref 70–99)
Potassium: 4.1 mEq/L (ref 3.5–5.1)
Sodium: 129 mEq/L — ABNORMAL LOW (ref 135–145)

## 2023-06-23 LAB — LIPID PANEL
Cholesterol: 231 mg/dL — ABNORMAL HIGH (ref 0–200)
HDL: 46.6 mg/dL (ref >39.00)
NonHDL: 184.21
Total CHOL/HDL Ratio: 5
Triglycerides: 317 mg/dL — ABNORMAL HIGH (ref 0.0–149.0)
VLDL: 63.4 mg/dL — ABNORMAL HIGH (ref 0.0–40.0)

## 2023-06-23 LAB — CBC WITH DIFFERENTIAL/PLATELET
Basophils Absolute: 0 10*3/uL (ref 0.0–0.1)
Basophils Relative: 0.4 % (ref 0.0–3.0)
Eosinophils Absolute: 0.1 10*3/uL (ref 0.0–0.7)
Eosinophils Relative: 1.6 % (ref 0.0–5.0)
HCT: 41 % (ref 39.0–52.0)
Hemoglobin: 13.9 g/dL (ref 13.0–17.0)
Lymphocytes Relative: 38.7 % (ref 12.0–46.0)
Lymphs Abs: 2.2 10*3/uL (ref 0.7–4.0)
MCHC: 33.9 g/dL (ref 30.0–36.0)
MCV: 92 fl (ref 78.0–100.0)
Monocytes Absolute: 0.4 10*3/uL (ref 0.1–1.0)
Monocytes Relative: 7 % (ref 3.0–12.0)
Neutro Abs: 3 10*3/uL (ref 1.4–7.7)
Neutrophils Relative %: 52.3 % (ref 43.0–77.0)
Platelets: 258 10*3/uL (ref 150.0–400.0)
RBC: 4.46 Mil/uL (ref 4.22–5.81)
RDW: 13.1 % (ref 11.5–15.5)
WBC: 5.8 10*3/uL (ref 4.0–10.5)

## 2023-06-23 LAB — LDL CHOLESTEROL, DIRECT: Direct LDL: 52 mg/dL

## 2023-06-23 LAB — VITAMIN D 25 HYDROXY (VIT D DEFICIENCY, FRACTURES): VITD: 32.06 ng/mL (ref 30.00–100.00)

## 2023-06-25 ENCOUNTER — Encounter: Payer: Self-pay | Admitting: Internal Medicine

## 2023-06-28 ENCOUNTER — Other Ambulatory Visit: Payer: Self-pay | Admitting: Internal Medicine

## 2023-06-28 DIAGNOSIS — E871 Hypo-osmolality and hyponatremia: Secondary | ICD-10-CM

## 2023-06-28 MED ORDER — SODIUM CHLORIDE 1 G PO TABS
1.0000 g | ORAL_TABLET | Freq: Two times a day (BID) | ORAL | 0 refills | Status: AC
Start: 2023-06-28 — End: ?

## 2023-07-08 ENCOUNTER — Other Ambulatory Visit: Payer: Self-pay | Admitting: Internal Medicine

## 2023-07-08 DIAGNOSIS — J301 Allergic rhinitis due to pollen: Secondary | ICD-10-CM

## 2023-07-12 ENCOUNTER — Ambulatory Visit (HOSPITAL_COMMUNITY)
Admission: RE | Admit: 2023-07-12 | Discharge: 2023-07-12 | Disposition: A | Discharge status: Home / Self Care | Source: Ambulatory Visit | Attending: Internal Medicine | Admitting: Internal Medicine

## 2023-07-12 ENCOUNTER — Encounter (HOSPITAL_COMMUNITY): Payer: Self-pay

## 2023-07-12 VITALS — BP 163/96 | HR 69 | Temp 97.5°F | Resp 18

## 2023-07-12 DIAGNOSIS — K6289 Other specified diseases of anus and rectum: Secondary | ICD-10-CM

## 2023-07-12 NOTE — ED Triage Notes (Signed)
Pt c/o hemorrhoids x2wks. States used OTC creams with no relief.

## 2023-07-12 NOTE — Discharge Instructions (Addendum)
Your rectal symptoms are likely due to a resorbing hemorrhoid.  Stop using the hydrocortisone cream.  Use sitz bath's to reduce swelling and irritation.  Please call [facility] to schedule an appointment to discuss this further with a colorectal surgeon.  I have placed an external referral to [facility] surgery and you should be hearing from them in the next 24 to 48 hours, however if you do not hear from them, please give them a phone call in the next 1 to 2 days.  Please schedule an appointment with Angel Combs your primary care provider for follow-up in the meantime.

## 2023-07-12 NOTE — ED Provider Notes (Addendum)
[facility]    CSN: [id number] Arrival date & time: 07/12/23  4098      History   Chief Complaint Chief Complaint  Patient presents with   Hemorrhoids    HPI Milt Coye is a 57 y.o. male.   Patient presents to urgent care for evaluation of suspected hemorrhoid to the [facility] rectal tissue that started approximately 2 weeks ago.  States the area is itchy and tender with bowel movements.  No history of hemorrhoids, this has never happened before.  Stools are soft and without blood/mucus in the stools.  He does not sit on the toilet for long periods of time and is very physically active.  No recent trauma or injuries to the perirectal area.  Denies abdominal pain, nausea, vomiting, fever/chills, and history of perirectal abscess.  He has been able to look at the area in the mirror and assumes this is likely a hemorrhoid.  Symptoms have not responded well to Preparation H use for the last 2 weeks.  As needed use of diclofenac helps with pain.  Has not attempted use of sitz bath's.  He has never had a colonoscopy in the past but has had a negative Cologuard.  No family history or personal history of colon cancer.     Past Medical History:  Diagnosis Date   ALLERGIC RHINITIS CAUSE UNSPECIFIED    Anxiety    using celxa.   LACTOSE INTOLERANCE    Left shoulder pain July '13   had tried accupuncture.    Pain of left heel    PATELLO-FEMORAL SYNDROME    sees chiropractor with good results using exercises.    Patient Active Problem List   Diagnosis Date Noted   Vitamin D deficiency disease 11/03/2022   Bradycardia 11/03/2022   Chronic hyponatremia 09/09/2021   Encounter for general adult medical examination with abnormal findings 11/27/2020   Frontal balding 08/29/2019   Essential hypertension 05/16/2018   Pure hyperglyceridemia 05/16/2018   Hypogonadism in male 01/31/2015   Controlled depression 10/19/2014   Elevated cholesterol with elevated triglycerides 08/19/2012    Allergic rhinitis 02/12/2009    Past Surgical History:  Procedure Laterality Date   lasik  Augu 22, 2013   NOSE SURGERY  1991       Home Medications    Prior to Admission medications   Medication Sig Start Date End Date Taking? Authorizing Provider  atorvastatin (LIPITOR) 10 MG tablet TAKE 1 TABLET BY MOUTH EVERY DAY 04/19/23   Etta Grandchild, MD  citalopram (CELEXA) 10 MG tablet TAKE 1 TABLET BY MOUTH EVERY DAY 05/10/22   Etta Grandchild, MD  finasteride (PROSCAR) 5 MG tablet TAKE 1 TABLET (5 MG TOTAL) BY MOUTH DAILY. 02/10/22   Etta Grandchild, MD  fluticasone [facility]) 50 MCG/ACT nasal spray SHAKE LIQUID AND USE 2 SPRAYS IN [facility] NOSTRIL DAILY 04/19/23   Etta Grandchild, MD  levocetirizine (XYZAL) 5 MG tablet TAKE 1 TABLET BY MOUTH EVERY DAY IN THE EVENING 07/08/23   Etta Grandchild, MD  meloxicam (MOBIC) 15 MG tablet Take 1 tablet (15 mg total) by mouth daily. 03/29/23   Richardean Sale, DO  omega-3 acid ethyl esters (LOVAZA) 1 g capsule Take 2 capsules (2 g total) by mouth 2 (two) times daily. 11/03/22   Etta Grandchild, MD  sodium chloride 1 g tablet Take 1 tablet (1 g total) by mouth 2 (two) times daily with a meal. 06/28/23   Etta Grandchild, MD  Vitamin D, Ergocalciferol, (  DRISDOL) 1.25 MG (50000 UNIT) CAPS capsule TAKE 1 CAPSULE (50,000 UNITS TOTAL) BY MOUTH EVERY 7 (SEVEN) DAYS 01/14/23   Etta Grandchild, MD    Family History Family History  Problem Relation Age of Onset   Hyperlipidemia Father    Hypertension Father    Coronary artery disease Father    Diabetes Maternal Grandmother    Coronary artery disease Other    Arthritis Mother        back pain   Cancer Neg Hx    Depression Neg Hx    Early death Neg Hx    Hearing loss Neg Hx    Heart disease Neg Hx    Kidney disease Neg Hx    Stroke Neg Hx     Social History Social History   Tobacco Use   Smoking status: Never   Smokeless tobacco: Never  Substance Use Topics   Alcohol use: Yes    Alcohol/week: 15.0  standard drinks of alcohol    Types: 15 drink(s) per week   Drug use: No     Allergies   Patient has no known allergies.   Review of Systems Review of Systems Per HPI  Physical Exam Triage Vital Signs ED Triage Vitals [07/12/23 0852]  Encounter Vitals Group     BP (!) 163/96     Systolic BP Percentile      Diastolic BP Percentile      Pulse Rate 69     Resp 18     Temp (!) 97.5 F (36.4 C)     Temp Source Oral     SpO2 98 %     Weight      Height      Head Circumference      Peak Flow      Pain Score      Pain Loc      Pain Education      Exclude from Growth Chart    No data found.  Updated Vital Signs BP (!) 163/96 (BP Location: Left Arm)   Pulse 69   Temp (!) 97.5 F (36.4 C) (Oral)   Resp 18   SpO2 98%   Visual Acuity Right Eye Distance:   Left Eye Distance:   Bilateral Distance:    Right Eye Near:   Left Eye Near:    Bilateral Near:     Physical Exam Vitals and nursing note reviewed. Exam conducted with a chaperone present (Dr. Tracie Harrier).  Constitutional:      Appearance: He is not ill-appearing or toxic-appearing.  HENT:     Head: Normocephalic and atraumatic.     Right Ear: Hearing and external ear normal.     Left Ear: Hearing and external ear normal.     Nose: Nose normal.     Mouth/Throat:     Lips: Pink.  Eyes:     General: Lids are normal. Vision grossly intact. Gaze aligned appropriately.     Extraocular Movements: Extraocular movements intact.     Conjunctiva/sclera: Conjunctivae normal.  Pulmonary:     Effort: Pulmonary effort is normal.  Genitourinary:    Comments: 1cm mass to the superior external rectum that has the appearance of a skin tag/flap. Mass is erythematous with mild tenderness with manipulation/palpation and mostly firm. No drainage, bleeding, or anal fissures. No rash or underlying soft tissue swelling/induration.  Internal exam reveals approximately 0.5cm firm nodule to the superior rectum that is tender to  palpation.  Musculoskeletal:     Cervical back:  Neck supple.  Skin:    General: Skin is warm and dry.     Capillary Refill: Capillary refill takes less than 2 seconds.     Findings: No rash.  Neurological:     General: No focal deficit present.     Mental Status: He is alert and oriented to person, place, and time. Mental status is at baseline.     Cranial Nerves: No dysarthria or facial asymmetry.  Psychiatric:        Mood and Affect: Mood normal.        Speech: Speech normal.        Behavior: Behavior normal.        Thought Content: Thought content normal.        Judgment: Judgment normal.      UC Treatments / Results  Labs (all labs ordered are listed, but only abnormal results are displayed) Labs Reviewed - No data to display  EKG   Radiology No results found.  Procedures Procedures (including critical care time)  Medications Ordered in UC Medications - No data to display  Initial Impression / Assessment and Plan / UC Course  I have reviewed the triage vital signs and the nursing notes.  Pertinent labs & imaging results that were available during my care of the patient were reviewed by me and considered in my medical decision making (see chart for details).   1.  Rectal mass Unclear etiology of rectal mass.  Dr. Tracie Harrier also examined patient and believes this to be a possible reabsorbing hemorrhoid versus acute hemorrhoid/tumor.  External referral to [facility] surgery placed given atypical presentation, lack of resolve, and lack of response to typical hemorrhoid treatment. Discussed red flag signs indicating need for ER workup.  Close follow-up with [facility] and primary care provider recommended.  Patient given phone number for [facility] to call them in the next 24 to 48 hours should he fail to hear from clinic to schedule appointment.  Advised to stop use of hydrocortisone cream.  May perform sitz bath's and continue taking  diclofenac as needed.  Counseled patient on potential for adverse effects with medications prescribed/recommended today, strict ER and return-to-clinic precautions discussed, patient verbalized understanding.   I spent 30 minutes of face-to-face and non-face-to-face time with patient.  This included previsit chart review, lab review, study review, order entry, electronic health record documentation, patient education.   Final Clinical Impressions(s) / UC Diagnoses   Final diagnoses:  Rectal mass     Discharge Instructions      Your rectal symptoms are likely due to a resorbing hemorrhoid.  Stop using the hydrocortisone cream.  Use sitz bath's to reduce swelling and irritation.  Please call [facility] to schedule an appointment to discuss this further with a colorectal surgeon.  I have placed an external referral to [facility] surgery and you should be hearing from them in the next 24 to 48 hours, however if you do not hear from them, please give them a phone call in the next 1 to 2 days.  Please schedule an appointment with Dr. Yetta Barre your primary care provider for follow-up in the meantime.       ED Prescriptions   None    PDMP not reviewed this encounter.   Carlisle Beers, FNP 07/12/23 1021    Carlisle Beers, FNP 07/12/23 1021    Carlisle Beers, [place] 07/12/23 1022

## 2023-07-14 ENCOUNTER — Encounter: Payer: Self-pay | Admitting: Internal Medicine

## 2023-07-18 ENCOUNTER — Other Ambulatory Visit: Payer: Self-pay | Admitting: Internal Medicine

## 2023-07-18 DIAGNOSIS — E782 Mixed hyperlipidemia: Secondary | ICD-10-CM

## 2023-07-19 ENCOUNTER — Other Ambulatory Visit: Payer: Self-pay | Admitting: Internal Medicine

## 2023-07-19 DIAGNOSIS — E781 Pure hyperglyceridemia: Secondary | ICD-10-CM

## 2023-10-24 ENCOUNTER — Other Ambulatory Visit: Payer: Self-pay | Admitting: Internal Medicine

## 2023-10-24 DIAGNOSIS — E782 Mixed hyperlipidemia: Secondary | ICD-10-CM

## 2023-10-26 ENCOUNTER — Other Ambulatory Visit: Payer: Self-pay | Admitting: Internal Medicine

## 2023-10-26 DIAGNOSIS — J301 Allergic rhinitis due to pollen: Secondary | ICD-10-CM

## 2023-10-27 ENCOUNTER — Other Ambulatory Visit: Payer: Self-pay | Admitting: Internal Medicine

## 2023-10-27 ENCOUNTER — Other Ambulatory Visit: Payer: Self-pay | Admitting: Sports Medicine

## 2023-10-27 DIAGNOSIS — E781 Pure hyperglyceridemia: Secondary | ICD-10-CM

## 2023-11-12 ENCOUNTER — Encounter: Payer: Self-pay | Admitting: Internal Medicine

## 2023-11-19 ENCOUNTER — Ambulatory Visit: Admitting: Internal Medicine

## 2023-11-19 ENCOUNTER — Ambulatory Visit: Admitting: Sports Medicine

## 2023-11-19 ENCOUNTER — Encounter: Payer: Self-pay | Admitting: Internal Medicine

## 2023-11-19 VITALS — BP 122/80 | HR 55 | Ht 71.0 in | Wt 170.0 lb

## 2023-11-19 VITALS — BP 120/84 | HR 62 | Temp 98.0°F | Ht 71.0 in | Wt 176.2 lb

## 2023-11-19 DIAGNOSIS — E782 Mixed hyperlipidemia: Secondary | ICD-10-CM

## 2023-11-19 DIAGNOSIS — G43109 Migraine with aura, not intractable, without status migrainosus: Secondary | ICD-10-CM

## 2023-11-19 DIAGNOSIS — I10 Essential (primary) hypertension: Secondary | ICD-10-CM | POA: Diagnosis not present

## 2023-11-19 DIAGNOSIS — E871 Hypo-osmolality and hyponatremia: Secondary | ICD-10-CM | POA: Diagnosis not present

## 2023-11-19 DIAGNOSIS — S76312A Strain of muscle, fascia and tendon of the posterior muscle group at thigh level, left thigh, initial encounter: Secondary | ICD-10-CM | POA: Diagnosis not present

## 2023-11-19 DIAGNOSIS — M25552 Pain in left hip: Secondary | ICD-10-CM

## 2023-11-19 DIAGNOSIS — S76012A Strain of muscle, fascia and tendon of left hip, initial encounter: Secondary | ICD-10-CM

## 2023-11-19 DIAGNOSIS — Z125 Encounter for screening for malignant neoplasm of prostate: Secondary | ICD-10-CM

## 2023-11-19 LAB — HEPATIC FUNCTION PANEL
ALT: 22 U/L (ref 0–53)
AST: 27 U/L (ref 0–37)
Albumin: 4.4 g/dL (ref 3.5–5.2)
Alkaline Phosphatase: 54 U/L (ref 39–117)
Bilirubin, Direct: 0.1 mg/dL (ref 0.0–0.3)
Total Bilirubin: 0.5 mg/dL (ref 0.2–1.2)
Total Protein: 7.1 g/dL (ref 6.0–8.3)

## 2023-11-19 LAB — BASIC METABOLIC PANEL
BUN: 14 mg/dL (ref 6–23)
CO2: 30 meq/L (ref 19–32)
Calcium: 9.4 mg/dL (ref 8.4–10.5)
Chloride: 97 meq/L (ref 96–112)
Creatinine, Ser: 1.23 mg/dL (ref 0.40–1.50)
GFR: 66.01 mL/min (ref >60.00)
Glucose, Bld: 95 mg/dL (ref 70–99)
Potassium: 4.3 meq/L (ref 3.5–5.1)
Sodium: 133 meq/L — ABNORMAL LOW (ref 135–145)

## 2023-11-19 LAB — URINALYSIS, ROUTINE W REFLEX MICROSCOPIC
Bilirubin Urine: NEGATIVE
Hgb urine dipstick: NEGATIVE
Ketones, ur: NEGATIVE
Leukocytes,Ua: NEGATIVE
Nitrite: NEGATIVE
Specific Gravity, Urine: 1.01 (ref 1.000–1.030)
Total Protein, Urine: NEGATIVE
Urine Glucose: NEGATIVE
Urobilinogen, UA: 0.2 (ref 0.0–1.0)
pH: 7.5 (ref 5.0–8.0)

## 2023-11-19 LAB — TRIGLYCERIDES: Triglycerides: 313 mg/dL — ABNORMAL HIGH (ref 0.0–149.0)

## 2023-11-19 LAB — PSA: PSA: 0.3 ng/mL (ref 0.10–4.00)

## 2023-11-19 LAB — TSH: TSH: 1.67 u[IU]/mL (ref 0.35–5.50)

## 2023-11-19 MED ORDER — MELOXICAM 15 MG PO TABS: 15.0000 mg | ORAL_TABLET | Freq: Every day | ORAL | 0 refills | Status: DC

## 2023-11-19 MED ORDER — NURTEC 75 MG PO TBDP: 1.0000 | ORAL_TABLET | Freq: Every day | ORAL | Status: DC | PRN

## 2023-11-19 NOTE — Progress Notes (Signed)
Angel [facility]bs D.[facility] [address] [place] [address] Phone: [phone]   Assessment and Plan:     1.  Left hip pain 2. Muscle strain of left gluteal region, initial encounter 3. Hamstring strain, left, initial encounter - Acute, uncomplicated, initial sports medicine visit - Left hip and leg pain for 1 week, most consistent with strains of proximal hamstring and gluteal musculature likely flared due to beach running and further exacerbated traveling on airplane - No red flags on physical exam, so no imaging - Start meloxicam 15 mg daily x2 weeks.  If still having pain after 2 weeks, complete 3rd-week of NSAID. May use remaining NSAID as needed once daily for pain control.  Do not to use additional over-the-counter NSAIDs (ibuprofen, naproxen, Advil, Aleve) while taking prescription NSAIDs.  May use Tylenol [phone] mg 2 to 3 times a day for breakthrough pain.  - Start HEP for hip  15 additional minutes spent for educating Therapeutic Home Exercise Program.  This included exercises focusing on stretching, strengthening, with focus on eccentric aspects.   Long term goals include an improvement in range of motion, strength, endurance as well as avoiding reinjury. Patient's frequency would include in 1-2 times a day, 3-5 times a week for a duration of 6-12 weeks. Proper technique shown and discussed handout in great detail with ATC.  All questions were discussed and answered.     Pertinent previous records reviewed include none  Follow Up: As needed if no improvement or worsening of symptoms in 3 to 4 weeks.  Will obtain hip x-ray and could consider ultrasound versus physical therapy   Subjective:   I, Angel Combs, am serving as a Neurosurgeon for Doctor Richardean Sale  Chief Complaint: left  hip pain   HPI:   11/19/23 Patient is a 57 year old male with concerns of left hip pain. Patient states left hip pain for a week. No MOI. He ran on  the beach. Pain has improved. Pain radiates down the leg and into the groin. Diclofenac for the pain and that helps some. No numbness or tingling    Relevant Historical Information: Hypertension,  Additional pertinent review of systems negative.   Current Outpatient Medications:    atorvastatin (LIPITOR) 10 MG tablet, TAKE 1 TABLET BY MOUTH EVERY DAY (Patient taking differently: Take 10 mg by mouth every other day. TAKE 1 TABLET BY MOUTH EVERY DAY), Disp: 90 tablet, Rfl: 0   citalopram (CELEXA) 10 MG tablet, TAKE 1 TABLET BY MOUTH EVERY DAY (Patient taking differently: Take 10 mg by mouth every other day.), Disp: 90 tablet, Rfl: 1   finasteride (PROSCAR) 5 MG tablet, TAKE 1 TABLET (5 MG TOTAL) BY MOUTH DAILY. (Patient taking differently: Take 5 mg by mouth every other day.), Disp: 90 tablet, Rfl: 1   fluticasone (FLONASE) 50 MCG/ACT nasal spray, SHAKE LIQUID AND USE 2 SPRAYS IN EACH NOSTRIL DAILY, Disp: 48 mL, Rfl: 1   levocetirizine (XYZAL) 5 MG tablet, TAKE 1 TABLET BY MOUTH EVERY DAY IN THE EVENING, Disp: 90 tablet, Rfl: 1   meloxicam (MOBIC) 15 MG tablet, Take 1 tablet (15 mg total) by mouth daily., Disp: 30 tablet, Rfl: 0   omega-3 acid ethyl esters (LOVAZA) 1 g capsule, TAKE 2 CAPSULES BY MOUTH TWICE A DAY (Patient taking differently: Take 2 capsules by mouth daily.), Disp: 360 capsule, Rfl: 0   Rimegepant Sulfate (NURTEC) 75 MG TBDP, Take 1 tablet (75 mg total) by mouth daily as  needed., Disp: , Rfl:    sodium chloride 1 g tablet, Take 1 tablet (1 g total) by mouth 2 (two) times daily with a meal. (Patient taking differently: Take 1 g by mouth as needed.), Disp: 180 tablet, Rfl: 0   Objective:     Vitals:   11/19/23 1532  BP: 122/80  Pulse: (!) 55  SpO2: 98%  Weight: 170 lb (77.1 kg)  Height: 5\' 11"  (1.803 m)      Body mass index is 23.71 kg/m.    Physical Exam:    General: awake, alert, and oriented no acute distress, nontoxic Skin: no suspicious lesions or  rashes Neuro:sensation intact distally with no deficits, normal muscle tone, no atrophy, strength 5/5 in all tested lower ext groups Psych: normal mood and affect, speech clear   Left hip: No deformity, swelling or wasting ROM Flexion 90, ext 30, IR 45, ER 45 TTP gluteal musculature, proximal hamstring NTTP over the hip flexors, greater trochanter,   si joint, lumbar spine Negative log roll with FROM Negative FABER Negative FADIR Negative Piriformis test Negative trendelenberg Gait normal  Negative straight leg raise, however increased strain along left hamstring compared to right  Electronically signed by:  Angel [facility]bs [facility] 4:13 PM 11/19/23

## 2023-11-19 NOTE — Progress Notes (Unsigned)
Subjective:  Patient ID: Angel Combs, male    DOB: [birth year]  Age: 57 y.o. MRN: [id number]  CC: Hypertension   HPI Freeland Leclair presents for f/up ----  Discussed the use of AI scribe software for clinical note transcription with the patient, who gave verbal consent to proceed.  History of Present Illness   The patient presents with a recurring visual disturbance, described as a blurring of close vision, which has been occurring approximately once a week for the past four to five weeks. The episodes, lasting about 20-30 minutes, are characterized by hyper light sensitivity, squiggly lines, and flashing lights, typically starting on the right side and progressing to the left. The patient denies associated headache, nausea, vomiting, dizziness, lightheadedness, or ear symptoms. There is no numbness, weakness, or tingling reported. The patient notes that these episodes can occur at different times of the day, often late morning or early afternoon, and in various settings, including at work and during leisure activities.  The patient has a history of tension headaches in childhood and infrequent migraines in adulthood, occurring approximately once every one to two years. However, these recent episodes are distinct in that they do not culminate in a headache. The patient denies any correlation between these episodes and sleep patterns, physical activity, or other health issues.  The patient acknowledges some current work-related stress, as well as some relationship stress, but does not identify a clear trigger for these episodes. The patient has sought care from an eye doctor, who ruled out an ocular cause for the symptoms and suggested the possibility of migraines. The patient expresses a preference for identifying the cause of these episodes and treating them as they occur, rather than taking a preventative medication.       Outpatient Medications Prior to Visit  Medication Sig Dispense Refill    atorvastatin (LIPITOR) 10 MG tablet TAKE 1 TABLET BY MOUTH EVERY DAY (Patient taking differently: Take 10 mg by mouth every other day. TAKE 1 TABLET BY MOUTH EVERY DAY) 90 tablet 0   citalopram (CELEXA) 10 MG tablet TAKE 1 TABLET BY MOUTH EVERY DAY (Patient taking differently: Take 10 mg by mouth every other day.) 90 tablet 1   finasteride (PROSCAR) 5 MG tablet TAKE 1 TABLET (5 MG TOTAL) BY MOUTH DAILY. (Patient taking differently: Take 5 mg by mouth every other day.) 90 tablet 1   fluticasone (FLONASE) 50 MCG/ACT nasal spray SHAKE LIQUID AND USE 2 SPRAYS IN EACH NOSTRIL DAILY 48 mL 1   levocetirizine (XYZAL) 5 MG tablet TAKE 1 TABLET BY MOUTH EVERY DAY IN THE EVENING 90 tablet 1   omega-3 acid ethyl esters (LOVAZA) 1 g capsule TAKE 2 CAPSULES BY MOUTH TWICE A DAY (Patient taking differently: Take 2 capsules by mouth daily.) 360 capsule 0   sodium chloride 1 g tablet Take 1 tablet (1 g total) by mouth 2 (two) times daily with a meal. (Patient taking differently: Take 1 g by mouth as needed.) 180 tablet 0   meloxicam (MOBIC) 15 MG tablet Take 1 tablet (15 mg total) by mouth daily. 30 tablet 0   Vitamin D, Ergocalciferol, (DRISDOL) 1.25 MG (50000 UNIT) CAPS capsule TAKE 1 CAPSULE (50,000 UNITS TOTAL) BY MOUTH EVERY 7 (SEVEN) DAYS 12 capsule 0   No facility-administered medications prior to visit.    ROS Review of Systems  Constitutional:  Negative for appetite change, chills, diaphoresis, fatigue and fever.  HENT: Negative.    Eyes:  Positive for photophobia and visual disturbance. Negative  for pain and redness.  Respiratory: Negative.  Negative for cough, chest tightness, shortness of breath and wheezing.   Cardiovascular:  Negative for chest pain, palpitations and leg swelling.  Gastrointestinal:  Negative for abdominal pain, constipation, diarrhea, nausea and vomiting.  Genitourinary:  Negative for difficulty urinating.  Musculoskeletal: Negative.  Negative for arthralgias and myalgias.   Skin: Negative.  Negative for color change.  Neurological:  Negative for dizziness and weakness.  Hematological:  Negative for adenopathy. Does not bruise/bleed easily.  Psychiatric/Behavioral:  Negative for confusion, decreased concentration, dysphoric mood and sleep disturbance. The patient is nervous/anxious.     Objective:  BP 120/84 (BP Location: Left Arm, Patient Position: Sitting, Cuff Size: Normal)   Pulse 62   Temp 98 F (36.7 C) (Oral)   Ht 5\' 11"  (1.803 m)   Wt 176 lb 3.2 oz (79.9 kg)   SpO2 97%   BMI 24.57 kg/m   BP Readings from Last 3 Encounters:  11/19/23 122/80  11/19/23 120/84  07/12/23 (!) 163/96    Wt Readings from Last 3 Encounters:  11/19/23 170 lb (77.1 kg)  11/19/23 176 lb 3.2 oz (79.9 kg)  06/14/23 175 lb (79.4 kg)    Physical Exam Vitals reviewed.  Constitutional:      Appearance: Normal appearance.  HENT:     Nose: Nose normal.     Mouth/Throat:     Mouth: Mucous membranes are moist.  Eyes:     General: No scleral icterus.    Extraocular Movements: Extraocular movements intact.     Pupils: Pupils are equal, round, and reactive to light.  Cardiovascular:     Rate and Rhythm: Normal rate and regular rhythm.     Heart sounds: No murmur heard. Pulmonary:     Effort: Pulmonary effort is normal.     Breath sounds: No stridor. No wheezing, rhonchi or rales.  Abdominal:     General: Abdomen is flat.     Palpations: There is no mass.     Tenderness: There is no abdominal tenderness. There is no guarding.     Hernia: No hernia is present.  Musculoskeletal:        General: Normal range of motion.     Cervical back: Neck supple.     Right lower leg: No edema.     Left lower leg: No edema.  Lymphadenopathy:     Cervical: No cervical adenopathy.  Skin:    General: Skin is warm and dry.     Findings: No rash.  Neurological:     General: No focal deficit present.     Mental Status: He is alert. Mental status is at baseline.     Cranial  Nerves: Cranial nerves 2-12 are intact.     Sensory: Sensation is intact.     Motor: Motor function is intact.     Coordination: Coordination is intact.     Deep Tendon Reflexes: Babinski sign absent on the right side. Babinski sign absent on the left side.     Reflex Scores:      Tricep reflexes are 0 on the right side and 0 on the left side.      Bicep reflexes are 0 on the right side and 0 on the left side.      Brachioradialis reflexes are 0 on the right side and 0 on the left side.      Patellar reflexes are 2+ on the right side and 2+ on the left side.  Achilles reflexes are 1+ on the right side and 1+ on the left side. Psychiatric:        Mood and Affect: Mood normal.        Behavior: Behavior normal.     Lab Results  Component Value Date   WBC 5.8 06/23/2023   HGB 13.9 06/23/2023   HCT 41.0 06/23/2023   PLT 258.0 06/23/2023   GLUCOSE 95 11/19/2023   CHOL 231 (H) 06/23/2023   TRIG 313.0 (H) 11/19/2023   HDL 46.60 06/23/2023   LDLDIRECT 52.0 06/23/2023   LDLCALC 93 11/20/2019   ALT 22 11/19/2023   AST 27 11/19/2023   NA 133 (L) 11/19/2023   K 4.3 11/19/2023   CL 97 11/19/2023   CREATININE 1.23 11/19/2023   BUN 14 11/19/2023   CO2 30 11/19/2023   TSH 1.67 11/19/2023   PSA 0.30 11/19/2023    No results found.  Assessment & Plan:   Essential hypertension- His BP is well controlled. -     Basic metabolic panel; Future -     Hepatic function panel; Future -     Urinalysis, Routine w reflex microscopic; Future -     TSH; Future  Chronic hyponatremia- Stable. No symptoms. -     Basic metabolic panel; Future -     Sodium, urine, random; Future  Screening for prostate cancer -     PSA; Future  Elevated cholesterol with elevated triglycerides -     Hepatic function panel; Future -     Triglycerides; Future -     TSH; Future  Migraine with aura and without status migrainosus, not intractable- Will start a CGRP-antagonist. -     Nurtec; Take 1 tablet (75  mg total) by mouth daily as needed.     Follow-up: Return in about 3 months (around 02/19/2024).  Sanda Linger, MD

## 2023-11-19 NOTE — Patient Instructions (Signed)
-   Start meloxicam 15 mg daily x2 weeks.  If still having pain after 2 weeks, complete 3rd-week of NSAID. May use remaining NSAID as needed once daily for pain control.  Do not to use additional over-the-counter NSAIDs (ibuprofen, naproxen, Advil, Aleve) while taking prescription NSAIDs.  May use Tylenol [phone] mg 2 to 3 times a day for breakthrough pain. Hamstring HEP  4 week follow up

## 2023-11-19 NOTE — Patient Instructions (Signed)

## 2023-11-20 LAB — SODIUM, URINE, RANDOM: Sodium, Ur: 55 mmol/L (ref 28–272)

## 2023-11-29 ENCOUNTER — Encounter: Payer: Self-pay | Admitting: Internal Medicine

## 2023-11-29 ENCOUNTER — Other Ambulatory Visit: Payer: Self-pay

## 2023-11-29 DIAGNOSIS — F32A Depression, unspecified: Secondary | ICD-10-CM

## 2023-11-29 MED ORDER — CITALOPRAM HYDROBROMIDE 10 MG PO TABS: 10.0000 mg | ORAL_TABLET | Freq: Every day | ORAL | 1 refills | Status: DC

## 2023-12-14 ENCOUNTER — Ambulatory Visit: Admitting: Internal Medicine

## 2023-12-18 ENCOUNTER — Other Ambulatory Visit: Payer: Self-pay | Admitting: Sports Medicine

## 2023-12-31 ENCOUNTER — Encounter: Payer: Self-pay | Admitting: Internal Medicine

## 2024-01-04 NOTE — Progress Notes (Signed)
 Angel Combs D.[facility] [address] [place] [address] Phone: [phone]   Assessment and Plan:     1. Acute pain of right shoulder 2. Rotator cuff strain, right, initial encounter  -Subacute, no improvement, initial visit - Most consistent with rotator cuff strain, likely supraspinatus based on physical exam, occurring either due to side sleeping and/or increasing physical activity weight lifting - X-ray obtained in clinic.  My interpretation: No acute fracture or dislocation.  Mild degenerative changes at [facility] joint.  Mild degenerative changes at greater tuberosity - Start meloxicam  15 mg daily x2 weeks.  If still having pain after 2 weeks, complete 3rd-week of NSAID. May use remaining NSAID as needed once daily for pain control.  Do not to use additional over-the-counter NSAIDs (ibuprofen, naproxen, Advil, Aleve) while taking prescription NSAIDs.  May use Tylenol  [phone] mg 2 to 3 times a day for breakthrough pain. - Start HEP for rotator cuff -Recommend rest from upper extremity workouts for 1 to 2 weeks, and then gradual return to physical activity.  Start activity at 50% (speed, duration, reps, sets, intensity) and allow 24 hours to assess for worsening pain.  If 50% is well-tolerated, may increase next activity to 75%.  If 75% is well-tolerated, may increase next physical activity to 100%.  If any of these levels cause pain, recommend dropping down to previous level for an additional 2-3 attempts before advancing.  Pertinent previous records reviewed include none  Follow Up: 4 weeks for reevaluation.  If any remaining symptoms, would ultrasound right shoulder and could further discuss PRP versus CSI   Subjective:   I, Angel Combs, am serving as a neurosurgeon for Angel Combs   Chief Complaint: left  hip pain , right shoulder    HPI:    11/19/23 Patient is a 58 year old male with concerns of left hip pain. Patient states  left hip pain for a week. No MOI. He ran on the beach. Pain has improved. Pain radiates down the leg and into the groin. Diclofenac  for the pain and that helps some. No numbness or tingling    01/05/2024 Patient states left hip is feeling better.   Right shoulder 2 months intermittent pain. Isn't able to sleep in right side.hx of left shoulder treated with PRP. Thinks it was a gym exercises was lifting heavier with bench press. No numbness or tingling. No radiating pain. Anterior shoulder pain. Meloxicam  helped but only took it once. ROM limited    Relevant Historical Information: Hypertension,  Additional pertinent review of systems negative.   Current Outpatient Medications:    atorvastatin  (LIPITOR) 10 MG tablet, TAKE 1 TABLET BY MOUTH EVERY DAY (Patient taking differently: Take 10 mg by mouth every other day. TAKE 1 TABLET BY MOUTH EVERY DAY), Disp: 90 tablet, Rfl: 0   citalopram  (CELEXA ) 10 MG tablet, Take 1 tablet (10 mg total) by mouth daily., Disp: 90 tablet, Rfl: 1   finasteride  (PROSCAR ) 5 MG tablet, TAKE 1 TABLET (5 MG TOTAL) BY MOUTH DAILY. (Patient taking differently: Take 5 mg by mouth every other day.), Disp: 90 tablet, Rfl: 1   fluticasone  (FLONASE ) 50 MCG/ACT nasal spray, SHAKE LIQUID AND USE 2 SPRAYS IN EACH NOSTRIL DAILY, Disp: 48 mL, Rfl: 1   levocetirizine (XYZAL ) 5 MG tablet, TAKE 1 TABLET BY MOUTH EVERY DAY IN THE EVENING, Disp: 90 tablet, Rfl: 1   meloxicam  (MOBIC ) 15 MG tablet, Take 1 tablet (15 mg total) by mouth daily.,  Disp: 30 tablet, Rfl: 0   omega-3 acid ethyl esters (LOVAZA ) 1 g capsule, TAKE 2 CAPSULES BY MOUTH TWICE A DAY (Patient taking differently: Take 2 capsules by mouth daily.), Disp: 360 capsule, Rfl: 0   Rimegepant Sulfate (NURTEC) 75 MG TBDP, Take 1 tablet (75 mg total) by mouth daily as needed., Disp: , Rfl:    sodium chloride  1 g tablet, Take 1 tablet (1 g total) by mouth 2 (two) times daily with a meal. (Patient taking differently: Take 1 g by mouth as  needed.), Disp: 180 tablet, Rfl: 0   Objective:     Vitals:   01/05/24 0859  BP: 122/80  Pulse: 71  SpO2: 100%  Weight: 173 lb (78.5 kg)  Height: 5' 11 (1.803 m)      Body mass index is 24.13 kg/m.    Physical Exam:    Gen: Appears well, nad, nontoxic and pleasant Neuro:sensation intact, strength is 5/5 with df/pf/inv/ev, muscle tone wnl Skin: no suspicious lesion or defmority Psych: A&O, appropriate mood and affect  Right shoulder:  No deformity, swelling or muscle wasting No scapular winging FF 180, abd 180, int 0, ext 90 TTP anterior shoulder, biceps groove NTTP over the [place], clavicle, ac, coracoid,   humerus, deltoid, trapezius, cervical spine Positive empty can, O'Brien Neg neer, hawkins,  crossarm, subscap liftoff, speeds Neg ant drawer, sulcus sign, apprehension Negative Spurling's test bilat FROM of neck    Electronically signed by:  Angel Combs [facility] 9:23 AM 01/05/24

## 2024-01-05 ENCOUNTER — Ambulatory Visit (INDEPENDENT_AMBULATORY_CARE_PROVIDER_SITE_OTHER)

## 2024-01-05 ENCOUNTER — Ambulatory Visit: Admitting: Sports Medicine

## 2024-01-05 VITALS — BP 122/80 | HR 71 | Ht 71.0 in | Wt 173.0 lb

## 2024-01-05 DIAGNOSIS — M25511 Pain in right shoulder: Secondary | ICD-10-CM | POA: Diagnosis not present

## 2024-01-05 DIAGNOSIS — S46011A Strain of muscle(s) and tendon(s) of the rotator cuff of right shoulder, initial encounter: Secondary | ICD-10-CM | POA: Diagnosis not present

## 2024-01-05 MED ORDER — MELOXICAM 15 MG PO TABS: 15.0000 mg | ORAL_TABLET | Freq: Every day | ORAL | 0 refills | Status: DC

## 2024-01-05 NOTE — Addendum Note (Signed)
 Addended by: Jerene Canny R on: 01/05/2024 09:26 AM   Modules accepted: Orders

## 2024-01-05 NOTE — Patient Instructions (Addendum)
 Shoulder HEP  - Start meloxicam  15 mg daily x2 weeks.  If still having pain after 2 weeks, complete 3rd-week of NSAID. May use remaining NSAID as needed once daily for pain control.  Do not to use additional over-the-counter NSAIDs (ibuprofen, naproxen, Advil, Aleve) while taking prescription NSAIDs.  May use Tylenol  [phone] mg 2 to 3 times a day for breakthrough pain.  -Recommend rest from upper extremity workouts for 1 to 2 weeks, and then gradual return to physical activity.  Start activity at 50% (speed, duration, reps, sets, intensity) and allow 24 hours to assess for worsening pain.  If 50% is well-tolerated, may increase next activity to 75%.  If 75% is well-tolerated, may increase next physical activity to 100%.  If any of these levels cause pain, recommend dropping down to previous level for an additional 2-3 attempts before advancing.  4 week follow up

## 2024-01-06 ENCOUNTER — Other Ambulatory Visit: Payer: Self-pay | Admitting: Internal Medicine

## 2024-01-06 DIAGNOSIS — E782 Mixed hyperlipidemia: Secondary | ICD-10-CM

## 2024-01-22 ENCOUNTER — Other Ambulatory Visit: Payer: Self-pay | Admitting: Internal Medicine

## 2024-01-22 DIAGNOSIS — E781 Pure hyperglyceridemia: Secondary | ICD-10-CM

## 2024-02-01 NOTE — Progress Notes (Signed)
 Ben Shanie Mauzy D.CLEMENTEEN [facility] [address] [place] [address] Phone: [phone]   Assessment and Plan:     1. Acute pain of right shoulder 2. Rotator cuff strain, right, subsequent encounter  -Subacute, improving, subsequent visit - Still consistent with rotator cuff strain, likely supraspinatus, versus subacromial bursitis.  Overall improvement with course of meloxicam , however intermittent pain primarily with side sleeping - Discontinue meloxicam  15 mg daily.  May use remainder of meloxicam  15 mg daily as needed for breakthrough pain.  Recommend limiting chronic NSAIDs to 1-2 doses per week - Continue HEP for rotator cuff - Patient elected for subacromial CSI.  Tolerated well per note below  Procedure: Subacromial Injection Side: Right  Risks explained and consent was given verbally. The site was cleaned with alcohol prep. A steroid injection was performed from posterior approach using 2mL of 1% lidocaine without epinephrine and 1mL of kenalog  40mg /ml. This was well tolerated and resulted in symptomatic relief.  Needle was removed, hemostasis achieved, and post injection instructions were explained.   Pt was advised to call or return to clinic if these symptoms worsen or fail to improve as anticipated.   Pertinent previous records reviewed include none  Follow Up: As needed if no improvement or worsening of symptoms in 2 to 3 weeks.  Could consider PRP injection versus physical therapy   Subjective:   I, [name], am serving as a neurosurgeon for Doctor Morene Mace   Chief Complaint: left  hip pain , right shoulder    HPI:    11/19/23 Patient is a 58 year old male with concerns of left hip pain. Patient states left hip pain for a week. No MOI. He ran on the beach. Pain has improved. Pain radiates down the leg and into the groin. Diclofenac  for the pain and that helps some. No numbness or tingling     01/05/2024 Patient states left  hip is feeling better.    Right shoulder 2 months intermittent pain. Isn't able to sleep in right side.hx of left shoulder treated with PRP. Thinks it was a gym exercises was lifting heavier with bench press. No numbness or tingling. No radiating pain. Anterior shoulder pain. Meloxicam  helped but only took it once. ROM limited   02/02/2024 Patient states he is great. Still has the arm and shoulder pain but it is not terrible.    Relevant Historical Information: Hypertension,    Additional pertinent review of systems negative.   Current Outpatient Medications:    atorvastatin  (LIPITOR) 10 MG tablet, TAKE 1 TABLET BY MOUTH EVERY DAY, Disp: 90 tablet, Rfl: 0   citalopram  (CELEXA ) 10 MG tablet, Take 1 tablet (10 mg total) by mouth daily., Disp: 90 tablet, Rfl: 1   finasteride  (PROSCAR ) 5 MG tablet, TAKE 1 TABLET (5 MG TOTAL) BY MOUTH DAILY. (Patient taking differently: Take 5 mg by mouth every other day.), Disp: 90 tablet, Rfl: 1   fluticasone  (FLONASE ) 50 MCG/ACT nasal spray, SHAKE LIQUID AND USE 2 SPRAYS IN EACH NOSTRIL DAILY, Disp: 48 mL, Rfl: 1   levocetirizine (XYZAL ) 5 MG tablet, TAKE 1 TABLET BY MOUTH EVERY DAY IN THE EVENING, Disp: 90 tablet, Rfl: 1   meloxicam  (MOBIC ) 15 MG tablet, Take 1 tablet (15 mg total) by mouth daily., Disp: 30 tablet, Rfl: 0   meloxicam  (MOBIC ) 15 MG tablet, Take 1 tablet (15 mg total) by mouth daily., Disp: 30 tablet, Rfl: 0   omega-3 acid ethyl esters (LOVAZA ) 1 g capsule,  TAKE 2 CAPSULES BY MOUTH 2 TIMES DAILY., Disp: 360 capsule, Rfl: 1   Rimegepant Sulfate (NURTEC) 75 MG TBDP, Take 1 tablet (75 mg total) by mouth daily as needed., Disp: , Rfl:    sodium chloride  1 g tablet, Take 1 tablet (1 g total) by mouth 2 (two) times daily with a meal. (Patient taking differently: Take 1 g by mouth as needed.), Disp: 180 tablet, Rfl: 0   Objective:     Vitals:   02/02/24 0855  BP: 120/80  Pulse: 62  SpO2: 98%  Weight: 174 lb 3.2 oz (79 kg)  Height: 5' 11 (1.803  m)      Body mass index is 24.3 kg/m.    Physical Exam:    Gen: Appears well, nad, nontoxic and pleasant Neuro:sensation intact, strength is 5/5 with df/pf/inv/ev, muscle tone wnl Skin: no suspicious lesion or defmority Psych: A&O, appropriate mood and affect   Right shoulder:  No deformity, swelling or muscle wasting No scapular winging FF 180, abd 180, int 0, ext 90 TTP anterior shoulder, biceps groove NTTP over the [place], clavicle, ac, coracoid,   humerus, deltoid, trapezius, cervical spine Positive empty can, O'Brien Neg neer, hawkins,  crossarm, subscap liftoff, speeds Neg ant drawer, sulcus sign, apprehension Negative Spurling's test bilat FROM of neck    Electronically signed by:  Odis Mace D.[facility] 9:08 AM 02/02/24

## 2024-02-02 ENCOUNTER — Ambulatory Visit (INDEPENDENT_AMBULATORY_CARE_PROVIDER_SITE_OTHER): Admitting: Sports Medicine

## 2024-02-02 VITALS — BP 120/80 | HR 62 | Ht 71.0 in | Wt 174.2 lb

## 2024-02-02 DIAGNOSIS — M25511 Pain in right shoulder: Secondary | ICD-10-CM | POA: Diagnosis not present

## 2024-02-02 DIAGNOSIS — S46011D Strain of muscle(s) and tendon(s) of the rotator cuff of right shoulder, subsequent encounter: Secondary | ICD-10-CM

## 2024-02-15 ENCOUNTER — Other Ambulatory Visit: Payer: Self-pay | Admitting: Internal Medicine

## 2024-02-15 DIAGNOSIS — J301 Allergic rhinitis due to pollen: Secondary | ICD-10-CM

## 2024-04-04 ENCOUNTER — Ambulatory Visit (INDEPENDENT_AMBULATORY_CARE_PROVIDER_SITE_OTHER): Admitting: Sports Medicine

## 2024-04-04 VITALS — HR 71 | Ht 71.0 in | Wt 172.0 lb

## 2024-04-04 DIAGNOSIS — G8929 Other chronic pain: Secondary | ICD-10-CM

## 2024-04-04 DIAGNOSIS — S46011D Strain of muscle(s) and tendon(s) of the rotator cuff of right shoulder, subsequent encounter: Secondary | ICD-10-CM | POA: Diagnosis not present

## 2024-04-04 DIAGNOSIS — M25511 Pain in right shoulder: Secondary | ICD-10-CM | POA: Diagnosis not present

## 2024-04-04 NOTE — Progress Notes (Signed)
 [facility] [address] [place] [address] Phone: [phone]   Assessment and Plan:     1. Chronic right shoulder pain 2. Rotator cuff strain, right, subsequent encounter  -Chronic with exacerbation, subsequent visit - Patient had significant incomplete relief after subacromial CSI on 02/02/2024, HEP, meloxicam course, however shoulder pain has returned especially at night with side sleeping over the past 2 to 4 weeks.  Consistent with recurrent subacromial bursitis.  Rotator cuff strain also in differential - Patient has had significant relief with PRP injection to left shoulder in the past, and would like to proceed with PRP injection to right shoulder.  Will follow-up at soonest available appointment for PRP injection - May continue meloxicam 15 mg daily as needed for pain relief prior to PRP injection - Recommend using Tylenol prior to sleep to decrease nighttime pain - Continue HEP  Pertinent previous records reviewed include none  Follow Up: Will follow-up at soonest available appointment for PRP injection in right subacromial space   Subjective:   I, Angel Combs, am serving as a Neurosurgeon for Doctor Richardean Sale   Chief Complaint: left  hip pain , right shoulder    HPI:    11/19/23 Patient is a 58 year old male with concerns of left hip pain. Patient states left hip pain for a week. No MOI. He ran on the beach. Pain has improved. Pain radiates down the leg and into the groin. Diclofenac for the pain and that helps some. No numbness or tingling     01/05/2024 Patient states left hip is feeling better.    Right shoulder 2 months intermittent pain. Isn't able to sleep in right side.hx of left shoulder treated with PRP. Thinks it was a gym exercises was lifting heavier with bench press. No numbness or tingling. No radiating pain. Anterior shoulder pain. Meloxicam helped but only took it once. ROM limited     02/02/2024 Patient states he is great. Still has the arm and shoulder pain but it is not terrible.   04/05/2024 Patient states shoulder pain came back. CSI worked for a while last 2-3 weeks pain has come back. Right shoulder pain just when he wakes up    Relevant Historical Information: Hypertension, Additional pertinent review of systems negative.   Current Outpatient Medications:    atorvastatin (LIPITOR) 10 MG tablet, TAKE 1 TABLET BY MOUTH EVERY DAY, Disp: 90 tablet, Rfl: 0   citalopram (CELEXA) 10 MG tablet, Take 1 tablet (10 mg total) by mouth daily., Disp: 90 tablet, Rfl: 1   finasteride (PROSCAR) 5 MG tablet, TAKE 1 TABLET (5 MG TOTAL) BY MOUTH DAILY. (Patient taking differently: Take 5 mg by mouth every other day.), Disp: 90 tablet, Rfl: 1   fluticasone (FLONASE) 50 MCG/ACT nasal spray, SHAKE LIQUID AND USE 2 SPRAYS IN EACH NOSTRIL DAILY, Disp: 48 mL, Rfl: 1   levocetirizine (XYZAL) 5 MG tablet, TAKE 1 TABLET BY MOUTH EVERY DAY IN THE EVENING, Disp: 90 tablet, Rfl: 1   meloxicam (MOBIC) 15 MG tablet, Take 1 tablet (15 mg total) by mouth daily., Disp: 30 tablet, Rfl: 0   meloxicam (MOBIC) 15 MG tablet, Take 1 tablet (15 mg total) by mouth daily., Disp: 30 tablet, Rfl: 0   omega-3 acid ethyl esters (LOVAZA) 1 g capsule, TAKE 2 CAPSULES BY MOUTH 2 TIMES DAILY., Disp: 360 capsule, Rfl: 1   Rimegepant Sulfate (NURTEC) 75 MG TBDP, Take 1 tablet (75 mg total) by mouth  daily as needed., Disp: , Rfl:    sodium chloride 1 g tablet, Take 1 tablet (1 g total) by mouth 2 (two) times daily with a meal. (Patient taking differently: Take 1 g by mouth as needed.), Disp: 180 tablet, Rfl: 0   Objective:     Vitals:   04/04/24 0918  Pulse: 71  SpO2: 100%  Weight: 172 lb (78 kg)  Height: 5\' 11"  (1.803 m)      Body mass index is 23.99 kg/m.    Physical Exam:    Gen: Appears well, nad, nontoxic and pleasant Neuro:sensation intact, strength is 5/5 with df/pf/inv/ev, muscle tone wnl Skin: no  suspicious lesion or defmority Psych: A&O, appropriate mood and affect   Right shoulder:  No deformity, swelling or muscle wasting No scapular winging FF 180, abd 180, int 0, ext 90 TTP anterior shoulder, biceps groove NTTP over the [place], clavicle, ac, coracoid,   humerus, deltoid, trapezius, cervical spine Positive empty can, O'Brien Neg neer, hawkins,  crossarm, subscap liftoff, speeds Neg ant drawer, sulcus sign, apprehension Negative Spurling's test bilat FROM of neck    Electronically signed by:  [facility] 9:34 AM 04/04/24

## 2024-04-05 ENCOUNTER — Ambulatory Visit: Admitting: Sports Medicine

## 2024-04-05 DIAGNOSIS — G8929 Other chronic pain: Secondary | ICD-10-CM

## 2024-04-05 DIAGNOSIS — M25511 Pain in right shoulder: Secondary | ICD-10-CM

## 2024-04-05 DIAGNOSIS — S46011D Strain of muscle(s) and tendon(s) of the rotator cuff of right shoulder, subsequent encounter: Secondary | ICD-10-CM

## 2024-04-05 NOTE — Progress Notes (Signed)
 Angel Combs [facility] [address] [place] [address] Phone: [phone]   Assessment and Plan:     1. Chronic right shoulder pain 2. Rotator cuff strain, right, subsequent encounter -Chronic with exacerbation, subsequent visit - Patient had significant relief after subacromial CSI on 02/02/2024, however pain gradually returned over the past 2 to 4 weeks.  Patient elected for PRP injection.  Tolerated well per note below - Continue HEP - May use Tylenol prior to sleep to decrease nighttime pain  Procedure: Subacromial Injection Side: Right   After explaining the procedure, viable alternatives, risks, and answering any questions, consent was given verbally.  Patient's blood was obtained and prepared by the staff using Tendon PRP centrifuge preporation. The site was cleaned with alcohol prep. Then under ultrasound guidance and sterile technique 6 ml of PRP was injected into the subacromial space from posterior approach.  This was well tolerated.  The needle was removed and dressing placed and post injection instructions were given including a discussion of likely return of pain today.  Pt was advised to not ice area or use NSAIDs for the next 6 weeks.   Pt was advised to call or return to clinic if these symptoms worsen or fail to improve as anticipated.    Pertinent previous records reviewed include none  Follow Up: 6 weeks to review benefit from PRP injection.  If no improvement or worsening of symptoms, could consider physical therapy versus advanced imaging   Subjective:   I, Angel Combs, am serving as a Neurosurgeon for Doctor Richardean Sale   Chief Complaint: left  hip pain , right shoulder    HPI:    11/19/23 Patient is a 58 year old male with concerns of left hip pain. Patient states left hip pain for a week. No MOI. He ran on the beach. Pain has improved. Pain radiates down the leg and into the groin. Diclofenac for the pain and  that helps some. No numbness or tingling     01/05/2024 Patient states left hip is feeling better.    Right shoulder 2 months intermittent pain. Isn't able to sleep in right side.hx of left shoulder treated with PRP. Thinks it was a gym exercises was lifting heavier with bench press. No numbness or tingling. No radiating pain. Anterior shoulder pain. Meloxicam helped but only took it once. ROM limited    02/02/2024 Patient states he is great. Still has the arm and shoulder pain but it is not terrible.    04/04/2024 Patient states shoulder pain came back. CSI worked for a while last 2-3 weeks pain has come back. Right shoulder pain just when he wakes up   04/05/2024 PRP    Relevant Historical Information: Hypertension,  Additional pertinent review of systems negative.   Current Outpatient Medications:    atorvastatin (LIPITOR) 10 MG tablet, TAKE 1 TABLET BY MOUTH EVERY DAY, Disp: 90 tablet, Rfl: 0   citalopram (CELEXA) 10 MG tablet, Take 1 tablet (10 mg total) by mouth daily., Disp: 90 tablet, Rfl: 1   finasteride (PROSCAR) 5 MG tablet, TAKE 1 TABLET (5 MG TOTAL) BY MOUTH DAILY. (Patient taking differently: Take 5 mg by mouth every other day.), Disp: 90 tablet, Rfl: 1   fluticasone (FLONASE) 50 MCG/ACT nasal spray, SHAKE LIQUID AND USE 2 SPRAYS IN EACH NOSTRIL DAILY, Disp: 48 mL, Rfl: 1   levocetirizine (XYZAL) 5 MG tablet, TAKE 1 TABLET BY MOUTH EVERY DAY IN THE EVENING, Disp: 90 tablet,  Rfl: 1   meloxicam (MOBIC) 15 MG tablet, Take 1 tablet (15 mg total) by mouth daily., Disp: 30 tablet, Rfl: 0   meloxicam (MOBIC) 15 MG tablet, Take 1 tablet (15 mg total) by mouth daily., Disp: 30 tablet, Rfl: 0   omega-3 acid ethyl esters (LOVAZA) 1 g capsule, TAKE 2 CAPSULES BY MOUTH 2 TIMES DAILY., Disp: 360 capsule, Rfl: 1   Rimegepant Sulfate (NURTEC) 75 MG TBDP, Take 1 tablet (75 mg total) by mouth daily as needed., Disp: , Rfl:    sodium chloride 1 g tablet, Take 1 tablet (1 g total) by mouth 2 (two)  times daily with a meal. (Patient taking differently: Take 1 g by mouth as needed.), Disp: 180 tablet, Rfl: 0      Electronically signed by:  Angel Combs [facility] 9:20 AM 04/05/24

## 2024-04-06 ENCOUNTER — Other Ambulatory Visit: Payer: Self-pay | Admitting: Internal Medicine

## 2024-04-06 DIAGNOSIS — E782 Mixed hyperlipidemia: Secondary | ICD-10-CM

## 2024-04-23 ENCOUNTER — Other Ambulatory Visit: Payer: Self-pay | Admitting: Internal Medicine

## 2024-04-23 DIAGNOSIS — E781 Pure hyperglyceridemia: Secondary | ICD-10-CM

## 2024-04-25 ENCOUNTER — Other Ambulatory Visit: Payer: Self-pay | Admitting: Internal Medicine

## 2024-04-25 DIAGNOSIS — J301 Allergic rhinitis due to pollen: Secondary | ICD-10-CM

## 2024-05-09 ENCOUNTER — Other Ambulatory Visit: Payer: Self-pay

## 2024-05-11 ENCOUNTER — Other Ambulatory Visit: Payer: Self-pay | Admitting: Internal Medicine

## 2024-05-11 DIAGNOSIS — E782 Mixed hyperlipidemia: Secondary | ICD-10-CM

## 2024-05-13 ENCOUNTER — Other Ambulatory Visit: Payer: Self-pay | Admitting: Internal Medicine

## 2024-05-13 DIAGNOSIS — E782 Mixed hyperlipidemia: Secondary | ICD-10-CM

## 2024-05-15 ENCOUNTER — Encounter: Payer: Self-pay | Admitting: Internal Medicine

## 2024-05-16 NOTE — Progress Notes (Signed)
 Ben Avant Printy D.[facility] [address] [place] [address] Phone: [phone]   Assessment and Plan:     1. Chronic right shoulder pain 2. Rotator cuff strain, right, subsequent encounter 3. Subacromial bursitis of right shoulder joint -Chronic with exacerbation, subsequent visit - Patient overall has had relief in shoulder pain after subacromial CSI on 02/02/2024 and PRP injection on 04/05/24.  Despite improvements in day-to-day pain and improvement in function, patient continues to experience nightly pain when sleeping on right shoulder.  This is most consistent with continued rotator cuff insertional bursitis - Recommend altering sleep position as much as possible to decrease pressure on shoulder - May use topical Voltaren  gel over areas of pain - May use Tylenol  prior to sleep to decrease nighttime pain - Patient elected for CSI directed at rotator cuff insertional bursa neck greater tuberosity.  Tolerated well per note below - Start physical therapy.  Referral sent.  Continue HEP  Procedure: Ultrasound Guided greater tuberosity Injection Side: Right Diagnosis: Subacromial/subdeltoid bursitis US  Indication:  - accuracy is paramount for diagnosis - to ensure therapeutic efficacy or procedural success - to reduce procedural risk  After explaining the procedure, viable alternatives, risks, and answering any questions, consent was given verbally. The site was cleaned with chlorhexidine prep. An ultrasound transducer was placed on the lateral shoulder.  The teres minor and infraspinatus were identified.  A steroid injection was performed under ultrasound guidance with sterile technique using  2ml of 1% lidocaine without epinephrine and 40 mg of triamcinolone  (KENALOG ) 40mg /ml. This was well tolerated and resulted in  relief.  Needle was removed and dressing placed and post injection instructions were given including  a discussion of likely return of  pain today after the anesthetic wears off (with the possibility of worsened pain) until the steroid starts to work in 1-3 days.   Pt was advised to call or return to clinic if these symptoms worsen or fail to improve as anticipated.   Images permanently stored.    Pertinent previous records reviewed include none  Follow Up: 3 weeks for reevaluation.  Could consider advanced imaging.   Subjective:   I, Angel Combs, am serving as a Neurosurgeon for Doctor Ulysees Gander   Chief Complaint: left  hip pain , right shoulder    HPI:    11/19/23 Patient is a 58 year old male with concerns of left hip pain. Patient states left hip pain for a week. No MOI. He ran on the beach. Pain has improved. Pain radiates down the leg and into the groin. Diclofenac  for the pain and that helps some. No numbness or tingling     01/05/2024 Patient states left hip is feeling better.    Right shoulder 2 months intermittent pain. Isn't able to sleep in right side.hx of left shoulder treated with PRP. Thinks it was a gym exercises was lifting heavier with bench press. No numbness or tingling. No radiating pain. Anterior shoulder pain. Meloxicam  helped but only took it once. ROM limited    02/02/2024 Patient states he is great. Still has the arm and shoulder pain but it is not terrible.    04/04/2024 Patient states shoulder pain came back. CSI worked for a while last 2-3 weeks pain has come back. Right shoulder pain just when he wakes up    04/05/2024 PRP   05/17/2024 Patient states he still feels pain . Some improvement but not enough. Pain at night he does sleep on the  right side   Relevant Historical Information: Hypertension,  Additional pertinent review of systems negative.   Current Outpatient Medications:    atorvastatin  (LIPITOR) 10 MG tablet, Take 1 tablet (10 mg total) by mouth daily. Schedule an appt for further refills, Disp: 30 tablet, Rfl: 0   citalopram  (CELEXA ) 10 MG tablet, Take 1 tablet (10 mg  total) by mouth daily., Disp: 90 tablet, Rfl: 1   finasteride  (PROSCAR ) 5 MG tablet, TAKE 1 TABLET (5 MG TOTAL) BY MOUTH DAILY. (Patient taking differently: Take 5 mg by mouth every other day.), Disp: 90 tablet, Rfl: 1   fluticasone  (FLONASE ) 50 MCG/ACT nasal spray, SHAKE LIQUID AND USE 2 SPRAYS IN EACH NOSTRIL DAILY, Disp: 48 mL, Rfl: 1   levocetirizine (XYZAL ) 5 MG tablet, TAKE 1 TABLET BY MOUTH EVERY DAY IN THE EVENING, Disp: 90 tablet, Rfl: 1   meloxicam  (MOBIC ) 15 MG tablet, Take 1 tablet (15 mg total) by mouth daily., Disp: 30 tablet, Rfl: 0   meloxicam  (MOBIC ) 15 MG tablet, Take 1 tablet (15 mg total) by mouth daily., Disp: 30 tablet, Rfl: 0   omega-3 acid ethyl esters (LOVAZA ) 1 g capsule, TAKE 2 CAPSULES BY MOUTH 2 TIMES DAILY., Disp: 360 capsule, Rfl: 1   Rimegepant Sulfate (NURTEC) 75 MG TBDP, Take 1 tablet (75 mg total) by mouth daily as needed., Disp: , Rfl:    sodium chloride  1 g tablet, Take 1 tablet (1 g total) by mouth 2 (two) times daily with a meal. (Patient taking differently: Take 1 g by mouth as needed.), Disp: 180 tablet, Rfl: 0   Objective:     Vitals:   05/17/24 0916  BP: 118/78  Pulse: 65  SpO2: 97%  Weight: 168 lb (76.2 kg)  Height: 5\' 11"  (1.803 m)      Body mass index is 23.43 kg/m.    Physical Exam:    Gen: Appears well, nad, nontoxic and pleasant Neuro:sensation intact, strength is 5/5 with df/pf/inv/ev, muscle tone wnl Skin: no suspicious lesion or defmority Psych: A&O, appropriate mood and affect   Right shoulder:  No deformity, swelling or muscle wasting No scapular winging FF 180, abd 180, int 0, ext 90 TTP lateral shoulder, deltoid NTTP over the [place], clavicle, ac, coracoid,   humerus,  , trapezius, cervical spine Positive empty can, O'Brien Neg neer, hawkins,  crossarm, subscap liftoff, speeds Neg ant drawer, sulcus sign, apprehension Negative Spurling's test bilat FROM of neck    Electronically signed by:  Marshall Skeeter D.[facility] 11:32 AM 05/17/24

## 2024-05-17 ENCOUNTER — Ambulatory Visit (HOSPITAL_BASED_OUTPATIENT_CLINIC_OR_DEPARTMENT_OTHER): Attending: Sports Medicine | Admitting: Physical Therapy

## 2024-05-17 ENCOUNTER — Ambulatory Visit (INDEPENDENT_AMBULATORY_CARE_PROVIDER_SITE_OTHER): Admitting: Sports Medicine

## 2024-05-17 ENCOUNTER — Ambulatory Visit: Payer: Self-pay

## 2024-05-17 ENCOUNTER — Other Ambulatory Visit: Payer: Self-pay

## 2024-05-17 ENCOUNTER — Encounter (HOSPITAL_BASED_OUTPATIENT_CLINIC_OR_DEPARTMENT_OTHER): Payer: Self-pay | Admitting: Physical Therapy

## 2024-05-17 VITALS — BP 118/78 | HR 65 | Ht 71.0 in | Wt 168.0 lb

## 2024-05-17 DIAGNOSIS — G8929 Other chronic pain: Secondary | ICD-10-CM | POA: Insufficient documentation

## 2024-05-17 DIAGNOSIS — M25511 Pain in right shoulder: Secondary | ICD-10-CM | POA: Insufficient documentation

## 2024-05-17 DIAGNOSIS — M7551 Bursitis of right shoulder: Secondary | ICD-10-CM

## 2024-05-17 DIAGNOSIS — S46011D Strain of muscle(s) and tendon(s) of the rotator cuff of right shoulder, subsequent encounter: Secondary | ICD-10-CM | POA: Insufficient documentation

## 2024-05-17 DIAGNOSIS — M25611 Stiffness of right shoulder, not elsewhere classified: Secondary | ICD-10-CM | POA: Insufficient documentation

## 2024-05-17 NOTE — Patient Instructions (Addendum)
 Refer to physical therapy to [facility]. Tylenol  for day to day pain relief. Continue Hep. Follow up in 3 weeks.

## 2024-05-17 NOTE — Therapy (Signed)
 OUTPATIENT PHYSICAL THERAPY UPPER EXTREMITY EVALUATION   Patient Name: Angel Combs MRN: [id number] DOB:December 04, 1966, 58 y.o., male Today's Date: 05/17/2024  END OF SESSION:  PT End of Session - 05/17/24 1520     Visit Number 1    Number of Visits 10    Date for PT Re-Evaluation 08/15/24    Authorization Type [employer]    PT Start Time 1406    PT Stop Time 1445    PT Time Calculation (min) 39 min    Activity Tolerance Patient tolerated treatment well    Behavior During Therapy WFL for tasks assessed/performed             Past Medical History:  Diagnosis Date   ALLERGIC RHINITIS CAUSE UNSPECIFIED    Anxiety    using celxa.   LACTOSE INTOLERANCE    Left shoulder pain July '13   had tried accupuncture.    Pain of left heel    PATELLO-FEMORAL SYNDROME    sees chiropractor with good results using exercises.   Past Surgical History:  Procedure Laterality Date   lasik  Augu 22, 2013   NOSE SURGERY  1991   Patient Active Problem List   Diagnosis Date Noted   Vitamin D  deficiency disease 11/03/2022   Bradycardia 11/03/2022   Chronic hyponatremia 09/09/2021   Encounter for general adult medical examination with abnormal findings 11/27/2020   Frontal balding 08/29/2019   Essential hypertension 05/16/2018   Pure hyperglyceridemia 05/16/2018   Hypogonadism in male 01/31/2015   Controlled depression 10/19/2014   Elevated cholesterol with elevated triglycerides 08/19/2012   Allergic rhinitis 02/12/2009    PCP: Arcadio Knuckles, MD   REFERRING PROVIDER: Ulysees Gander, DO   REFERRING DIAG:  Diagnosis  M25.511,G89.29 (ICD-10-CM) - Chronic right shoulder pain  S46.011D (ICD-10-CM) - Rotator cuff strain, right, subsequent encounter  M75.51 (ICD-10-CM) - Subacromial bursitis of right shoulder joint    THERAPY DIAG:  Decreased right shoulder range of motion  Chronic right shoulder pain  Rationale for Evaluation and Treatment: Rehabilitation  ONSET DATE:  12/2023  SUBJECTIVE:                                                                                                                                                                                      SUBJECTIVE STATEMENT: Pt states pain started in January from heavy lifting on the bench press. Pt has had meds and an injection from DO with some relief. Pt got a RC HEP from MD. Pt recently got PRP in April of this year. Pt just had injection this morning as well. Pt states sleeping on the R shoulder is still painful and the most  bothersome. Motion is not a problem. Pain happens every night. Pt does get worse with going to the gym. Pt has avoided all [place] exercise. Denies painful crepitus. Denies weakness of the arm. Denies cancer red flags. Denies systemic involvement  Pt lifts 2x/week and runs the rest.   Hand dominance: Right  PERTINENT HISTORY: Depression, HTN,   PAIN:  Are you having pain? Yes: NPRS scale: 0/10 at rest; 7/10   Pain location: R deltoid Pain description: sharp Aggravating factors: sleeping onto the R side Relieving factors: moving/rolling over  PRECAUTIONS: None  RED FLAGS: None   WEIGHT BEARING RESTRICTIONS: No  FALLS:  Has patient fallen in last 6 months? 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   PLOF: Independent  PATIENT GOALS: return to normal exercise, sleep comfortably,     OBJECTIVE:  Note: Objective measures were completed at Evaluation unless otherwise noted.  DIAGNOSTIC FINDINGS:    IMPRESSION: 1. Mild acromioclavicular degenerative change. 2. 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     Electronically Signed   By: Chadwick Colonel M.D.   On: 01/09/2024 23:59  PATIENT SURVEYS :  QuickDASH Score: 29.5 / 100 = 29.5 %   COGNITION: Overall cognitive status: Within functional limits for tasks assessed                                      SENSATION: WFL   POSTURE: Mild fwd shoulders, mild kyphotic posture   UPPER EXTREMITY ROM: mild discomfort with ER and behind back reaching   AROM Right 5/21  Left 5/21  Shoulder flexion 150 150  Shoulder extension    Shoulder abduction 155 155  Shoulder adduction    Shoulder internal rotation 60 tight 60  Shoulder external rotation 65 tight 65  Elbow flexion    Elbow extension    Wrist flexion    Wrist extension    Wrist ulnar deviation    Wrist radial deviation    Wrist pronation    Wrist supination    (Blank rows = not tested)    UPPER EXTREMITY MMT: full break test deferred given CSI this morning; 4/5 through R UE with special testing pressure   SHOULDER SPECIAL TESTS: Impingement tests: Hawkins/Kennedy impingement test: positive and Painful arc test: negative SLAP lesions: Biceps load test: negative Rotator cuff assessment: Empty can test: negative and Full can test: negative Yerganson: positive Lift off: negative Biceps assessment: Speed's test: negative   JOINT MOBILITY TESTING:  Inferior, posterior, and anterior stiffness on R as expected    PALPATION:  TTP of R infra; hypertonicity throughout R shoulder girdle              TODAY'S TREATMENT:  DATE:   5/21   Program Notes Leg up shoulder ER rotation (2.5-5lbs) 3x10  Exercises - Standing Shoulder Posterior Capsule Stretch  - 2 x daily - 7 x weekly - 1 sets - 3 reps - 30 hold - Latissimus Dorsi Stretch at Wall  - 2 x daily - 7 x weekly - 1 sets - 3 reps - 30 hold - Shoulder Abduction with Dumbbells - Palms Down  - 1 x daily - 2-3 x weekly - 2-3 sets - 8-10 reps - 5 hold      PATIENT EDUCATION: Education details: MOI, diagnosis, prognosis, anatomy, exercise progression, DOMS expectations, muscle firing,  envelope of function, HEP, POC    Person educated: Patient Education method: Explanation, Demonstration, Tactile cues, Verbal cues, and Handouts Education comprehension: verbalized understanding, returned demonstration, verbal cues required, tactile cues required, and needs further education   HOME EXERCISE PROGRAM: Access Code: [email or web address] URL: [email or web address] Date: 05/17/2024 Prepared by: Silver Dross  ASSESSMENT:   CLINICAL IMPRESSION: Patient is a 58 y.o. male who was seen today for physical therapy evaluation and treatment for c/c of R shoulder pain. Pt's s/s appear consistent with a rotator cuff tendinopathy and lateral shoulder sensitivity. Pt's pain is minimally sensitive and irritable with movement. Clinical testing does not suggest internal derangement. Pt is most limited with sleeping due to static positioning type pain. Shoulder is not painful with AROM or light resistance. Unable to MMT with HHD at today's session given recency of CSI. Plan for HHD testing at future visits for objective numbers. Plan to continue with R posterior shoulder pain management modalities PRN and focused posterior shoulder mobility and strengthening type movements at future sessions. Consider joint mobilizations given shoulder stiffness. Pt would benefit from continued skilled therapy in order to reach goals and maximize functional R UE strength and ROM for full return to PLOF.    OBJECTIVE IMPAIRMENTS: decreased strength, hypomobility, increased muscle spasms, impaired flexibility, impaired UE functional use, improper body mechanics, postural dysfunction, and pain.    ACTIVITY LIMITATIONS: carrying, lifting, dressing, and sleeping   PARTICIPATION LIMITATIONS: interpersonal relationship, community activity, and exercise   PERSONAL FACTORS: Fitness, Time since onset of injury/illness/exacerbation, are also affecting patient's functional outcome.    REHAB POTENTIAL: Good   CLINICAL DECISION MAKING: Stable/uncomplicated    EVALUATION COMPLEXITY: Low   GOALS:   SHORT TERM GOALS: Target Date 06/28/2024  Pt  will become independent with final HEP in order to demonstrate synthesis of PT education.    Goal status: INITIAL   2.  Pt will report at least 2 pt reduction on NPRS scale for pain in order to demonstrate functional improvement with household activity, self care, and ADL.      Goal status: INITIAL   3.  Pt will decrease quick DASH score by at least 16% in order to demonstrate clinically significant reduction in disability.      LONG TERM GOALS: Target date: 08/09/2024        Pt  will become independent with final HEP in order to demonstrate synthesis of PT education.    Goal status: INITIAL   2.  Pt will be able to demonstrate full resisted R shoulder ROM without pain in order to demonstrate functional improvement in UE function for return to normalized ADL and exercise.     Goal status: INITIAL   3.  Pt will be able to demonstrate/report ability to sleep for extended periods of time without pain in order to demonstrate functional improvement and tolerance to  static positioning.     Goal status: INITIAL   4.  Pt will be able to demonstrate ability to push/pull/press with the R UE without pain in order to demonstrate functional improvement in UE function for return to exercise.  Goal status: INITIAL     PLAN: PT FREQUENCY: 1x/every other week   PT DURATION: 10-12 weeks (4-5 sessions likely needed)    PLANNED INTERVENTIONS: Therapeutic exercises, Therapeutic activity, Neuromuscular re-education, Balance training, Gait training, Patient/Family education, Self Care, Joint mobilization, Joint manipulation, Aquatic Therapy, Dry Needling, Electrical stimulation, Wheelchair mobility training, Spinal manipulation, Spinal mobilization, Moist heat, scar mobilization, Splintting, Taping, Vasopneumatic device, Traction, Ultrasound, Ionotophoresis 4mg /ml Dexamethasone, Manual therapy, and  Re-evaluation   PLAN FOR NEXT SESSION: joint mobs, STM to infra, check bench form, D2 flexion/ext, sleeper stretch     Silver Dross, PT 05/17/2024, 3:30 PM

## 2024-05-29 ENCOUNTER — Ambulatory Visit (HOSPITAL_BASED_OUTPATIENT_CLINIC_OR_DEPARTMENT_OTHER): Admitting: Physical Therapy

## 2024-06-06 NOTE — Progress Notes (Unsigned)
 Angel Combs D.[facility] [address] [place] [address] Phone: [phone]   Assessment and Plan:     There are no diagnoses linked to this encounter.  ***   Pertinent previous records reviewed include ***    Follow Up: ***     Subjective:   I, Angel Combs, am serving as a Neurosurgeon for Doctor Angel Combs   Chief Complaint: left  hip pain , right shoulder    HPI:    11/19/23 Patient is a 58 year old male with concerns of left hip pain. Patient states left hip pain for a week. No MOI. He ran on the beach. Pain has improved. Pain radiates down the leg and into the groin. Diclofenac  for the pain and that helps some. No numbness or tingling     01/05/2024 Patient states left hip is feeling better.    Right shoulder 2 months intermittent pain. Isn't able to sleep in right side.hx of left shoulder treated with PRP. Thinks it was a gym exercises was lifting heavier with bench press. No numbness or tingling. No radiating pain. Anterior shoulder pain. Meloxicam  helped but only took it once. ROM limited    02/02/2024 Patient states he is great. Still has the arm and shoulder pain but it is not terrible.    04/04/2024 Patient states shoulder pain came back. CSI worked for a while last 2-3 weeks pain has come back. Right shoulder pain just when he wakes up    04/05/2024 PRP    05/17/2024 Patient states he still feels pain . Some improvement but not enough. Pain at night he does sleep on the right side   06/07/2024 Patient states   Relevant Historical Information: Hypertension, Additional pertinent review of systems negative.   Current Outpatient Medications:    atorvastatin  (LIPITOR) 10 MG tablet, Take 1 tablet (10 mg total) by mouth daily. Schedule an appt for further refills, Disp: 30 tablet, Rfl: 0   citalopram  (CELEXA ) 10 MG tablet, Take 1 tablet (10 mg total) by mouth daily., Disp: 90 tablet, Rfl: 1   finasteride  (PROSCAR ) 5 MG  tablet, TAKE 1 TABLET (5 MG TOTAL) BY MOUTH DAILY. (Patient taking differently: Take 5 mg by mouth every other day.), Disp: 90 tablet, Rfl: 1   fluticasone  (FLONASE ) 50 MCG/ACT nasal spray, SHAKE LIQUID AND USE 2 SPRAYS IN EACH NOSTRIL DAILY, Disp: 48 mL, Rfl: 1   levocetirizine (XYZAL ) 5 MG tablet, TAKE 1 TABLET BY MOUTH EVERY DAY IN THE EVENING, Disp: 90 tablet, Rfl: 1   meloxicam  (MOBIC ) 15 MG tablet, Take 1 tablet (15 mg total) by mouth daily., Disp: 30 tablet, Rfl: 0   meloxicam  (MOBIC ) 15 MG tablet, Take 1 tablet (15 mg total) by mouth daily., Disp: 30 tablet, Rfl: 0   omega-3 acid ethyl esters (LOVAZA ) 1 g capsule, TAKE 2 CAPSULES BY MOUTH 2 TIMES DAILY., Disp: 360 capsule, Rfl: 1   Rimegepant Sulfate (NURTEC) 75 MG TBDP, Take 1 tablet (75 mg total) by mouth daily as needed., Disp: , Rfl:    sodium chloride  1 g tablet, Take 1 tablet (1 g total) by mouth 2 (two) times daily with a meal. (Patient taking differently: Take 1 g by mouth as needed.), Disp: 180 tablet, Rfl: 0   Objective:     There were no vitals filed for this visit.    There is no height or weight on file to calculate BMI.    Physical Exam:    ***  Electronically signed by:  Marshall Skeeter D.[facility] 7:49 AM 06/06/24

## 2024-06-07 ENCOUNTER — Ambulatory Visit: Admitting: Sports Medicine

## 2024-06-07 VITALS — HR 74 | Ht 71.0 in | Wt 169.0 lb

## 2024-06-07 DIAGNOSIS — G8929 Other chronic pain: Secondary | ICD-10-CM

## 2024-06-07 DIAGNOSIS — S46011D Strain of muscle(s) and tendon(s) of the rotator cuff of right shoulder, subsequent encounter: Secondary | ICD-10-CM | POA: Diagnosis not present

## 2024-06-07 DIAGNOSIS — M25511 Pain in right shoulder: Secondary | ICD-10-CM

## 2024-06-13 ENCOUNTER — Encounter (HOSPITAL_BASED_OUTPATIENT_CLINIC_OR_DEPARTMENT_OTHER): Payer: Self-pay | Admitting: Physical Therapy

## 2024-06-15 ENCOUNTER — Encounter: Payer: Self-pay | Admitting: Internal Medicine

## 2024-06-15 ENCOUNTER — Ambulatory Visit: Admitting: Internal Medicine

## 2024-06-15 VITALS — BP 138/88 | HR 66 | Temp 98.5°F | Resp 16 | Ht 71.0 in | Wt 167.8 lb

## 2024-06-15 DIAGNOSIS — E782 Mixed hyperlipidemia: Secondary | ICD-10-CM

## 2024-06-15 DIAGNOSIS — E871 Hypo-osmolality and hyponatremia: Secondary | ICD-10-CM | POA: Diagnosis not present

## 2024-06-15 DIAGNOSIS — I10 Essential (primary) hypertension: Secondary | ICD-10-CM

## 2024-06-15 DIAGNOSIS — F32A Depression, unspecified: Secondary | ICD-10-CM | POA: Diagnosis not present

## 2024-06-15 DIAGNOSIS — Z719 Counseling, unspecified: Secondary | ICD-10-CM | POA: Insufficient documentation

## 2024-06-15 DIAGNOSIS — Z125 Encounter for screening for malignant neoplasm of prostate: Secondary | ICD-10-CM | POA: Diagnosis not present

## 2024-06-15 LAB — PSA: PSA: 0.22 ng/mL (ref 0.10–4.00)

## 2024-06-15 LAB — URINALYSIS, ROUTINE W REFLEX MICROSCOPIC
Bilirubin Urine: NEGATIVE
Hgb urine dipstick: NEGATIVE
Ketones, ur: NEGATIVE
Leukocytes,Ua: NEGATIVE
Nitrite: NEGATIVE
RBC / HPF: NONE SEEN (ref 0–?)
Specific Gravity, Urine: <=1.005 — AB (ref 1.000–1.030)
Total Protein, Urine: NEGATIVE
Urine Glucose: NEGATIVE
Urobilinogen, UA: 0.2 (ref 0.0–1.0)
WBC, UA: NONE SEEN (ref 0–?)
pH: 6 (ref 5.0–8.0)

## 2024-06-15 LAB — BASIC METABOLIC PANEL WITH GFR
BUN: 15 mg/dL (ref 6–23)
CO2: 30 meq/L (ref 19–32)
Calcium: 9.5 mg/dL (ref 8.4–10.5)
Chloride: 97 meq/L (ref 96–112)
Creatinine, Ser: 1.21 mg/dL (ref 0.40–1.50)
GFR: 67.05 mL/min (ref >60.00)
Glucose, Bld: 91 mg/dL (ref 70–99)
Potassium: 4.4 meq/L (ref 3.5–5.1)
Sodium: 131 meq/L — ABNORMAL LOW (ref 135–145)

## 2024-06-15 LAB — CBC WITH DIFFERENTIAL/PLATELET
Basophils Absolute: 0 10*3/uL (ref 0.0–0.1)
Basophils Relative: 0.6 % (ref 0.0–3.0)
Eosinophils Absolute: 0.1 10*3/uL (ref 0.0–0.7)
Eosinophils Relative: 1.3 % (ref 0.0–5.0)
HCT: 41.6 % (ref 39.0–52.0)
Hemoglobin: 14.2 g/dL (ref 13.0–17.0)
Lymphocytes Relative: 37.3 % (ref 12.0–46.0)
Lymphs Abs: 1.8 10*3/uL (ref 0.7–4.0)
MCHC: 34.1 g/dL (ref 30.0–36.0)
MCV: 92.7 fl (ref 78.0–100.0)
Monocytes Absolute: 0.4 10*3/uL (ref 0.1–1.0)
Monocytes Relative: 8.1 % (ref 3.0–12.0)
Neutro Abs: 2.5 10*3/uL (ref 1.4–7.7)
Neutrophils Relative %: 52.7 % (ref 43.0–77.0)
Platelets: 237 10*3/uL (ref 150.0–400.0)
RBC: 4.49 Mil/uL (ref 4.22–5.81)
RDW: 12.9 % (ref 11.5–15.5)
WBC: 4.7 10*3/uL (ref 4.0–10.5)

## 2024-06-15 LAB — LIPID PANEL
Cholesterol: 178 mg/dL (ref 0–200)
HDL: 56.8 mg/dL (ref >39.00)
LDL Cholesterol: 88 mg/dL (ref 0–99)
NonHDL: 121.25
Total CHOL/HDL Ratio: 3
Triglycerides: 167 mg/dL — ABNORMAL HIGH (ref 0.0–149.0)
VLDL: 33.4 mg/dL (ref 0.0–40.0)

## 2024-06-15 NOTE — Progress Notes (Unsigned)
 Subjective:  Patient ID: Angel Combs, male    DOB: 1966/11/07  Age: 58 y.o. MRN: [id number]  CC: Hypertension and Hyperlipidemia   HPI Angel Combs presents for f/up ----  Discussed the use of AI scribe software for clinical note transcription with the patient, who gave verbal consent to proceed.  History of Present Illness   Angel Combs is a 58 year old male who presents with ocular migraines and follow-up on cholesterol management.  He has been experiencing ocular migraines for the past six months, occurring sporadically and lasting about 10 to 20 minutes. These episodes affect his vision on either the left or right side without associated headaches, nausea, vomiting, numbness, weakness, or tingling. He finds them annoying but not debilitating and can usually ignore them until they resolve. He uses Nurtec occasionally but is uncertain of its effectiveness.  He maintains an active lifestyle, engaging in running, walking, and hiking. He runs approximately three and a half miles in about 30 minutes and reports good endurance. Occasionally, he experiences fatigue, which he attributes to aging. No chest pain, shortness of breath, dizziness, or lightheadedness during physical activity.  He has been taking atorvastatin  daily for the past four to five months to manage his cholesterol, with no side effects such as leg or joint aches.  No chest pain, shortness of breath, dizziness, lightheadedness, headaches, nausea, vomiting, numbness, weakness, or tingling.       Outpatient Medications Prior to Visit  Medication Sig Dispense Refill   atorvastatin  (LIPITOR) 10 MG tablet Take 1 tablet (10 mg total) by mouth daily. Schedule an appt for further refills 30 tablet 0   citalopram  (CELEXA ) 10 MG tablet Take 1 tablet (10 mg total) by mouth daily. 90 tablet 1   finasteride  (PROSCAR ) 5 MG tablet TAKE 1 TABLET (5 MG TOTAL) BY MOUTH DAILY. (Patient taking differently: Take 5 mg by mouth every other  day.) 90 tablet 1   fluticasone  (FLONASE ) 50 MCG/ACT nasal spray SHAKE LIQUID AND USE 2 SPRAYS IN EACH NOSTRIL DAILY 48 mL 1   levocetirizine (XYZAL ) 5 MG tablet TAKE 1 TABLET BY MOUTH EVERY DAY IN THE EVENING 90 tablet 1   omega-3 acid ethyl esters (LOVAZA ) 1 g capsule TAKE 2 CAPSULES BY MOUTH 2 TIMES DAILY. 360 capsule 1   Rimegepant Sulfate (NURTEC) 75 MG TBDP Take 1 tablet (75 mg total) by mouth daily as needed.     sodium chloride  1 g tablet Take 1 tablet (1 g total) by mouth 2 (two) times daily with a meal. 180 tablet 0   meloxicam  (MOBIC ) 15 MG tablet Take 1 tablet (15 mg total) by mouth daily. 30 tablet 0   meloxicam  (MOBIC ) 15 MG tablet Take 1 tablet (15 mg total) by mouth daily. 30 tablet 0   No facility-administered medications prior to visit.    ROS Review of Systems  Objective:  BP 138/88 (BP Location: Left Arm, Patient Position: Sitting, Cuff Size: Normal)   Pulse 66   Temp 98.5 F (36.9 C) (Oral)   Resp 16   Ht 5' 11 (1.803 m)   Wt 167 lb 12.8 oz (76.1 kg)   SpO2 96%   BMI 23.40 kg/m   BP Readings from Last 3 Encounters:  06/15/24 138/88  05/17/24 118/78  02/02/24 120/80    Wt Readings from Last 3 Encounters:  06/15/24 167 lb 12.8 oz (76.1 kg)  06/07/24 169 lb (76.7 kg)  05/17/24 168 lb (76.2 kg)    Physical Exam Vitals  reviewed.  Constitutional:      Appearance: Normal appearance.  HENT:     Nose: Nose normal.     Mouth/Throat:     Mouth: Mucous membranes are moist.   Eyes:     General: No scleral icterus.    Conjunctiva/sclera: Conjunctivae normal.    Cardiovascular:     Rate and Rhythm: Normal rate and regular rhythm.     Heart sounds: No murmur heard.    No friction rub. No gallop.     Comments: EKG---- NSR, 61 bpm Lateral inverted T waves are not new No LVH Unchanged  Pulmonary:     Effort: Pulmonary effort is normal.     Breath sounds: No stridor. No wheezing, rhonchi or rales.  Abdominal:     General: Abdomen is flat.      Palpations: There is no mass.     Tenderness: There is no abdominal tenderness. There is no guarding.     Hernia: No hernia is present.   Musculoskeletal:        General: Normal range of motion.     Cervical back: Neck supple.     Right lower leg: No edema.     Left lower leg: No edema.  Lymphadenopathy:     Cervical: No cervical adenopathy.   Skin:    General: Skin is warm and dry.   Neurological:     General: No focal deficit present.     Mental Status: He is alert.   Psychiatric:        Mood and Affect: Mood normal.        Behavior: Behavior normal.     Lab Results  Component Value Date   WBC 5.8 06/23/2023   HGB 13.9 06/23/2023   HCT 41.0 06/23/2023   PLT 258.0 06/23/2023   GLUCOSE 95 11/19/2023   CHOL 231 (H) 06/23/2023   TRIG 313.0 (H) 11/19/2023   HDL 46.60 06/23/2023   LDLDIRECT 52.0 06/23/2023   LDLCALC 93 11/20/2019   ALT 22 11/19/2023   AST 27 11/19/2023   NA 133 (L) 11/19/2023   K 4.3 11/19/2023   CL 97 11/19/2023   CREATININE 1.23 11/19/2023   BUN 14 11/19/2023   CO2 30 11/19/2023   TSH 1.67 11/19/2023   PSA 0.30 11/19/2023    No results found.  Assessment & Plan:  Essential hypertension -     EKG 12-Lead -     Urinalysis, Routine w reflex microscopic; Future -     CBC with Differential/Platelet; Future  Chronic hyponatremia -     Sodium, urine, random; Future -     Basic metabolic panel with GFR; Future  Elevated cholesterol with elevated triglycerides -     Lipid panel; Future  Screening for prostate cancer -     PSA; Future     Follow-up: Return in about 6 months (around 12/15/2024).  Sandra Crouch, MD

## 2024-06-15 NOTE — Patient Instructions (Signed)
 Hypertension, Adult High blood pressure (hypertension) is when the force of blood pumping through the arteries is too strong. The arteries are the blood vessels that carry blood from the heart throughout the body. Hypertension forces the heart to work harder to pump blood and may cause arteries to become narrow or stiff. Untreated or uncontrolled hypertension can lead to a heart attack, heart failure, a stroke, kidney disease, and other problems. A blood pressure reading consists of a higher number over a lower number. Ideally, your blood pressure should be below 120/80. The first ("top") number is called the systolic pressure. It is a measure of the pressure in your arteries as your heart beats. The second ("bottom") number is called the diastolic pressure. It is a measure of the pressure in your arteries as the heart relaxes. What are the causes? The exact cause of this condition is not known. There are some conditions that result in high blood pressure. What increases the risk? Certain factors may make you more likely to develop high blood pressure. Some of these risk factors are under your control, including: Smoking. Not getting enough exercise or physical activity. Being overweight. Having too much fat, sugar, calories, or salt (sodium) in your diet. Drinking too much alcohol. Other risk factors include: Having a personal history of heart disease, diabetes, high cholesterol, or kidney disease. Stress. Having a family history of high blood pressure and high cholesterol. Having obstructive sleep apnea. Age. The risk increases with age. What are the signs or symptoms? High blood pressure may not cause symptoms. Very high blood pressure (hypertensive crisis) may cause: Headache. Fast or irregular heartbeats (palpitations). Shortness of breath. Nosebleed. Nausea and vomiting. Vision changes. Severe chest pain, dizziness, and seizures. How is this diagnosed? This condition is diagnosed by  measuring your blood pressure while you are seated, with your arm resting on a flat surface, your legs uncrossed, and your feet flat on the floor. The cuff of the blood pressure monitor will be placed directly against the skin of your upper arm at the level of your heart. Blood pressure should be measured at least twice using the same arm. Certain conditions can cause a difference in blood pressure between your right and left arms. If you have a high blood pressure reading during one visit or you have normal blood pressure with other risk factors, you may be asked to: Return on a different day to have your blood pressure checked again. Monitor your blood pressure at home for 1 week or longer. If you are diagnosed with hypertension, you may have other blood or imaging tests to help your health care provider understand your overall risk for other conditions. How is this treated? This condition is treated by making healthy lifestyle changes, such as eating healthy foods, exercising more, and reducing your alcohol intake. You may be referred for counseling on a healthy diet and physical activity. Your health care provider may prescribe medicine if lifestyle changes are not enough to get your blood pressure under control and if: Your systolic blood pressure is above 130. Your diastolic blood pressure is above 80. Your personal target blood pressure may vary depending on your medical conditions, your age, and other factors. Follow these instructions at home: Eating and drinking  Eat a diet that is high in fiber and potassium, and low in sodium, added sugar, and fat. An example of this eating plan is called the DASH diet. DASH stands for Dietary Approaches to Stop Hypertension. To eat this way: Eat  plenty of fresh fruits and vegetables. Try to fill one half of your plate at each meal with fruits and vegetables. Eat whole grains, such as whole-wheat pasta, brown rice, or whole-grain bread. Fill about one  fourth of your plate with whole grains. Eat or drink low-fat dairy products, such as skim milk or low-fat yogurt. Avoid fatty cuts of meat, processed or cured meats, and poultry with skin. Fill about one fourth of your plate with lean proteins, such as fish, chicken without skin, beans, eggs, or tofu. Avoid pre-made and processed foods. These tend to be higher in sodium, added sugar, and fat. Reduce your daily sodium intake. Many people with hypertension should eat less than 1,500 mg of sodium a day. Do not drink alcohol if: Your health care provider tells you not to drink. You are pregnant, may be pregnant, or are planning to become pregnant. If you drink alcohol: Limit how much you have to: 0-1 drink a day for women. 0-2 drinks a day for men. Know how much alcohol is in your drink. In the [place] one drink equals one 12 oz bottle of beer (355 mL), one 5 oz glass of wine (148 mL), or one 1 oz glass of hard liquor (44 mL). Lifestyle  Work with your health care provider to maintain a healthy body weight or to lose weight. Ask what an ideal weight is for you. Get at least 30 minutes of exercise that causes your heart to beat faster (aerobic exercise) most days of the week. Activities may include walking, swimming, or biking. Include exercise to strengthen your muscles (resistance exercise), such as Pilates or lifting weights, as part of your weekly exercise routine. Try to do these types of exercises for 30 minutes at least 3 days a week. Do not use any products that contain nicotine or tobacco. These products include cigarettes, chewing tobacco, and vaping devices, such as e-cigarettes. If you need help quitting, ask your health care provider. Monitor your blood pressure at home as told by your health care provider. Keep all follow-up visits. This is important. Medicines Take over-the-counter and prescription medicines only as told by your health care provider. Follow directions carefully. Blood  pressure medicines must be taken as prescribed. Do not skip doses of blood pressure medicine. Doing this puts you at risk for problems and can make the medicine less effective. Ask your health care provider about side effects or reactions to medicines that you should watch for. Contact a health care provider if you: Think you are having a reaction to a medicine you are taking. Have headaches that keep coming back (recurring). Feel dizzy. Have swelling in your ankles. Have trouble with your vision. Get help right away if you: Develop a severe headache or confusion. Have unusual weakness or numbness. Feel faint. Have severe pain in your chest or abdomen. Vomit repeatedly. Have trouble breathing. These symptoms may be an emergency. Get help right away. Call 911. Do not wait to see if the symptoms will go away. Do not drive yourself to the hospital. Summary Hypertension is when the force of blood pumping through your arteries is too strong. If this condition is not controlled, it may put you at risk for serious complications. Your personal target blood pressure may vary depending on your medical conditions, your age, and other factors. For most people, a normal blood pressure is less than 120/80. Hypertension is treated with lifestyle changes, medicines, or a combination of both. Lifestyle changes include losing weight, eating a healthy,  low-sodium diet, exercising more, and limiting alcohol. This information is not intended to replace advice given to you by your health care provider. Make sure you discuss any questions you have with your health care provider. Document Revised: 10/21/2021 Document Reviewed: 10/21/2021 Elsevier Patient Education  2024 [employer].
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
## 2024-06-16 ENCOUNTER — Ambulatory Visit: Payer: Self-pay | Admitting: Internal Medicine

## 2024-06-16 LAB — SODIUM, URINE, RANDOM: Sodium, Ur: 16 mmol/L — ABNORMAL LOW (ref 28–272)

## 2024-06-19 ENCOUNTER — Encounter (HOSPITAL_BASED_OUTPATIENT_CLINIC_OR_DEPARTMENT_OTHER): Admitting: Physical Therapy

## 2024-06-19 MED ORDER — ATORVASTATIN CALCIUM 10 MG PO TABS
10.0000 mg | ORAL_TABLET | Freq: Every day | ORAL | 1 refills | Status: DC
Start: 2024-06-19 — End: 2024-11-22

## 2024-06-19 MED ORDER — CITALOPRAM HYDROBROMIDE 10 MG PO TABS: 10.0000 mg | ORAL_TABLET | Freq: Every day | ORAL | 1 refills | Status: AC

## 2024-07-10 ENCOUNTER — Ambulatory Visit (HOSPITAL_BASED_OUTPATIENT_CLINIC_OR_DEPARTMENT_OTHER): Attending: Sports Medicine | Admitting: Physical Therapy

## 2024-07-12 ENCOUNTER — Encounter: Payer: Self-pay | Admitting: Internal Medicine

## 2024-07-12 ENCOUNTER — Ambulatory Visit: Payer: Self-pay

## 2024-07-12 ENCOUNTER — Ambulatory Visit: Admitting: Internal Medicine

## 2024-07-12 VITALS — BP 126/80 | HR 64 | Temp 98.1°F | Ht 71.0 in | Wt 170.0 lb

## 2024-07-12 DIAGNOSIS — E559 Vitamin D deficiency, unspecified: Secondary | ICD-10-CM

## 2024-07-12 DIAGNOSIS — L03011 Cellulitis of right finger: Secondary | ICD-10-CM | POA: Diagnosis not present

## 2024-07-12 DIAGNOSIS — I10 Essential (primary) hypertension: Secondary | ICD-10-CM | POA: Diagnosis not present

## 2024-07-12 MED ORDER — AMOXICILLIN-POT CLAVULANATE 875-125 MG PO TABS: 1.0000 | ORAL_TABLET | Freq: Two times a day (BID) | ORAL | 0 refills | Status: DC

## 2024-07-12 NOTE — Patient Instructions (Signed)
 Please take all new medication as prescribed - the antibiotic  Please continue all other medications as before, and refills have been done if requested.  Please have the pharmacy call with any other refills you may need.  Please keep your appointments with your specialists as you may have planned

## 2024-07-12 NOTE — Assessment & Plan Note (Signed)
 BP Readings from Last 3 Encounters:  07/12/24 126/80  06/15/24 138/88  05/17/24 118/78   Stable, pt to continue medical treatment  - diet , wt control

## 2024-07-12 NOTE — Progress Notes (Signed)
 Patient ID: Angel Combs, male   DOB: [birth year], 58 y.o.   MRN: [id number]        Chief Complaint: follow up 3rd finger right hand cellulitis       HPI:  Angel Combs is a 58 y.o. male here with 2 days onset pain,redness, swelling that seemed to start the nail bed, but rapidly worsneing to involve the hold distal finger after PIP.  No high fever, chills.  No overt cause it seems but works with hand frequently and has dog that may have nipped him?  Pt denies chest pain, increased sob or doe, wheezing, orthopnea, PND, increased LE swelling, palpitations, dizziness or syncope.   Pt denies polydipsia, polyuria, or new focal neuro s/s.        Wt Readings from Last 3 Encounters:  07/12/24 170 lb (77.1 kg)  06/15/24 167 lb 12.8 oz (76.1 kg)  06/07/24 169 lb (76.7 kg)   BP Readings from Last 3 Encounters:  07/12/24 126/80  06/15/24 138/88  05/17/24 118/78         Past Medical History:  Diagnosis Date   ALLERGIC RHINITIS CAUSE UNSPECIFIED    Anxiety    using celxa.   LACTOSE INTOLERANCE    Left shoulder pain July '13   had tried accupuncture.    Pain of left heel    PATELLO-FEMORAL SYNDROME    sees chiropractor with good results using exercises.   Past Surgical History:  Procedure Laterality Date   lasik  Augu 22, 2013   NOSE SURGERY  1991    reports that he has never smoked. He has never used smokeless tobacco. He reports current alcohol use of about 15.0 standard drinks of alcohol per week. He reports that he does not use drugs. family history includes Arthritis in his mother; Coronary artery disease in his father and another family member; Diabetes in his maternal grandmother; Hyperlipidemia in his father; Hypertension in his father. No Known Allergies Current Outpatient Medications on File Prior to Visit  Medication Sig Dispense Refill   atorvastatin  (LIPITOR) 10 MG tablet Take 1 tablet (10 mg total) by mouth daily. Schedule an appt for further refills 90 tablet 1   citalopram   (CELEXA ) 10 MG tablet Take 1 tablet (10 mg total) by mouth daily. 90 tablet 1   finasteride  (PROSCAR ) 5 MG tablet TAKE 1 TABLET (5 MG TOTAL) BY MOUTH DAILY. (Patient taking differently: Take 5 mg by mouth every other day.) 90 tablet 1   fluticasone  (FLONASE ) 50 MCG/ACT nasal spray SHAKE LIQUID AND USE 2 SPRAYS IN EACH NOSTRIL DAILY 48 mL 1   levocetirizine (XYZAL ) 5 MG tablet TAKE 1 TABLET BY MOUTH EVERY DAY IN THE EVENING 90 tablet 1   omega-3 acid ethyl esters (LOVAZA ) 1 g capsule TAKE 2 CAPSULES BY MOUTH 2 TIMES DAILY. 360 capsule 1   Rimegepant Sulfate (NURTEC) 75 MG TBDP Take 1 tablet (75 mg total) by mouth daily as needed.     sodium chloride  1 g tablet Take 1 tablet (1 g total) by mouth 2 (two) times daily with a meal. 180 tablet 0   No current facility-administered medications on file prior to visit.        ROS:  All others reviewed and negative.  Objective        PE:  BP 126/80 (BP Location: Left Arm, Patient Position: Sitting, Cuff Size: Normal)   Pulse 64   Temp 98.1 F (36.7 C) (Oral)   Ht 5' 11 (1.803 m)  Wt 170 lb (77.1 kg)   SpO2 98%   BMI 23.71 kg/m                 Constitutional: Pt appears in NAD               HENT: Head: NCAT.                Right Ear: External ear normal.                 Left Ear: External ear normal.                Eyes: . Pupils are equal, round, and reactive to light. Conjunctivae and EOM are normal               Nose: without d/c or deformity               Neck: Neck supple. Gross normal ROM               Cardiovascular: Normal rate and regular rhythm.                 Pulmonary/Chest: Effort normal and breath sounds without rales or wheezing.                               Neurological: Pt is alert. At baseline orientation, motor grossly intact               Skin: Skin is warm. LE edema - none; 3rd finger right hand with red, swelling, tender without abscess or drainage to finger all distal to the PIP               Psychiatric: Pt behavior  is normal without agitation   Micro: none  Cardiac tracings I have personally interpreted today:  none  Pertinent Radiological findings (summarize): none   Lab Results  Component Value Date   WBC 4.7 06/15/2024   HGB 14.2 06/15/2024   HCT 41.6 06/15/2024   PLT 237.0 06/15/2024   GLUCOSE 91 06/15/2024   CHOL 178 06/15/2024   TRIG 167.0 (H) 06/15/2024   HDL 56.80 06/15/2024   LDLDIRECT 52.0 06/23/2023   LDLCALC 88 06/15/2024   ALT 22 11/19/2023   AST 27 11/19/2023   NA 131 (L) 06/15/2024   K 4.4 06/15/2024   CL 97 06/15/2024   CREATININE 1.21 06/15/2024   BUN 15 06/15/2024   CO2 30 06/15/2024   TSH 1.67 11/19/2023   PSA 0.22 06/15/2024   Assessment/Plan:  Angel Combs is a 58 y.o. White or Caucasian [1] male with  has a past medical history of ALLERGIC RHINITIS CAUSE UNSPECIFIED, Anxiety, LACTOSE INTOLERANCE, Left shoulder pain (July '13), Pain of left heel, and PATELLO-FEMORAL SYNDROME.  Cellulitis of middle finger, right Mild to mod for now but getting worse; can't r/o dog bite involvement though no overt puncture wound - for augmentin  875 bid course  Vitamin D  deficiency disease Last vitamin D  Lab Results  Component Value Date   VD25OH 32.06 06/23/2023   Low, to start oral replacement   Essential hypertension BP Readings from Last 3 Encounters:  07/12/24 126/80  06/15/24 138/88  05/17/24 118/78   Stable, pt to continue medical treatment  - diet , wt control  Followup: Return if symptoms worsen or fail to improve.  Agent Rush, MD 07/12/2024 3:32 PM [facility]

## 2024-07-12 NOTE — Assessment & Plan Note (Signed)
 Mild to mod for now but getting worse; can't r/o dog bite involvement though no overt puncture wound - for augmentin  875 bid course

## 2024-07-12 NOTE — Assessment & Plan Note (Signed)
 Last vitamin D  Lab Results  Component Value Date   VD25OH 32.06 06/23/2023   Low, to start oral replacement

## 2024-07-12 NOTE — Telephone Encounter (Signed)
 FYI Only or Action Required?: FYI only for provider.  Patient was last seen in primary care on 06/15/2024 by Joshua Debby CROME, MD.  Called Nurse Triage reporting Finger Injury.  Interventions attempted: Nothing.  Symptoms are: gradually worsening.  Triage Disposition: See HCP Within 4 Hours (Or PCP Triage)  Patient/caregiver understands and will follow disposition?: Yes, will follow disposition  Copied from CRM [id number]. Topic: Clinical - Red Word Triage >> Jul 12, 2024 12:08 PM Burnard DEL wrote: Red Word that prompted transfer to Nurse Triage: bug bite on finger,swollen,can't flex finger,warm to the touch Reason for Disposition  [1] Looks infected (spreading redness, red streak, pus) AND [2] large red area (> 2 inches or 5 cm, or entire finger)  Answer Assessment - Initial Assessment Questions 1. ONSET: When did the pain start?      R 3rd  2. LOCATION and RADIATION: Where is the pain located?  (e.g., fingertip, around nail, joint, entire      First joint 3. SEVERITY: How bad is the pain? What does it keep you from doing?   (Scale 1-10; or mild, moderate, severe)     4 4. APPEARANCE: What does the finger look like? (e.g., redness, swelling, bruising, pallor)     Redness, swelling, denies bruising 6. CAUSE: What do you think is causing the pain?     unsure 7. AGGRAVATING FACTORS: What makes the pain worse? (e.g., using computer)     Movement makes worse 8. OTHER SYMPTOMS: Do you have any other symptoms? (e.g., fever, neck pain, numbness)     denies  Protocols used: Finger Pain-A-AH

## 2024-07-24 ENCOUNTER — Encounter (HOSPITAL_BASED_OUTPATIENT_CLINIC_OR_DEPARTMENT_OTHER): Admitting: Physical Therapy

## 2024-08-07 ENCOUNTER — Encounter (HOSPITAL_BASED_OUTPATIENT_CLINIC_OR_DEPARTMENT_OTHER): Admitting: Physical Therapy

## 2024-08-13 ENCOUNTER — Other Ambulatory Visit: Payer: Self-pay | Admitting: Internal Medicine

## 2024-08-13 DIAGNOSIS — J301 Allergic rhinitis due to pollen: Secondary | ICD-10-CM

## 2024-11-18 ENCOUNTER — Encounter: Payer: Self-pay | Admitting: Internal Medicine

## 2024-11-19 ENCOUNTER — Other Ambulatory Visit: Payer: Self-pay | Admitting: Internal Medicine

## 2024-11-19 DIAGNOSIS — E782 Mixed hyperlipidemia: Secondary | ICD-10-CM

## 2024-11-19 DIAGNOSIS — E781 Pure hyperglyceridemia: Secondary | ICD-10-CM

## 2024-11-27 NOTE — Progress Notes (Unsigned)
 Angel Combs D.[facility] [address] [place] [address] Phone: [phone]   Assessment and Plan:     1. Chronic right shoulder pain (Primary) 2. Rotator cuff strain, right, subsequent encounter -Chronic with exacerbation, subsequent visit - Recurrence of right shoulder pain most consistent with strain/tendinitis of supraspinatus tendon versus subacromial bursitis likely due to physical activity, side sleeping - Patient has had significant relief in the past with CSI to greater tuberosity on 05/17/2024, as well as with prior subacromial CSI and PRP injections.  Elected for repeat CSI today.  Tolerated well per note below - Continue HEP and physical activity as tolerated - May restart physical therapy.  Referral sent - Use Tylenol  500 to 1000 mg tablets 2-3 times a day for day-to-day pain relief - Recommend Voltaren  gel topically over areas of pain   Procedure: Ultrasound Guided greater tuberosity Injection Side: Right Diagnosis: Subacromial/subdeltoid bursitis US  Indication:  - accuracy is paramount for diagnosis - to ensure therapeutic efficacy or procedural success - to reduce procedural risk   After explaining the procedure, viable alternatives, risks, and answering any questions, consent was given verbally. The site was cleaned with chlorhexidine prep. An ultrasound transducer was placed on the lateral shoulder.  The teres minor and infraspinatus were identified.  A steroid injection was performed under ultrasound guidance with sterile technique using  2ml of 1% lidocaine without epinephrine and 40 mg of triamcinolone  (KENALOG ) 40mg /ml. This was well tolerated and resulted in  relief.  Needle was removed and dressing placed and post injection instructions were given including  a discussion of likely return of pain today after the anesthetic wears off (with the possibility of worsened pain) until the steroid starts to work in 1-3 days.   Pt  was advised to call or return to clinic if these symptoms worsen or fail to improve as anticipated.   Images permanently stored.   Pertinent previous records reviewed include none   Follow Up: 2 weeks for reevaluation.  Plan on bilateral PRP injection targeting greater tuberosity   Subjective:   I, Angel Combs am a scribe for Dr. Leonce.   Chief Complaint: right shoulder pain   HPI:   11/19/23 Patient is a 58 year old male with concerns of left hip pain. Patient states left hip pain for a week. No MOI. [place] ran [place] the beach. Pain has improved. Pain radiates down the leg and into the groin. Diclofenac  for the pain and that helps some. No numbness or tingling     01/05/2024 Patient states left hip is feeling better.    Right shoulder 2 months intermittent pain. Isn't able to sleep in right side.hx of left shoulder treated with PRP. Thinks it was a gym exercises was lifting heavier with bench press. No numbness or tingling. No radiating pain. Anterior shoulder pain. Meloxicam  helped but only took it once. ROM limited    02/02/2024 Patient states he is great. Still has the arm and shoulder pain but it is not terrible.    04/04/2024 Patient states shoulder pain came back. CSI worked for a while last 2-3 weeks pain has come back. Right shoulder pain just when he wakes up    04/05/2024 PRP    05/17/2024 Patient states he still feels pain . Some improvement but not enough. Pain at night he does sleep on the right side    06/07/2024 Patient states he is doing great. Pain after he works out but he knows its because  he's getting back into working out.   11/28/24 Patient states that the right shoulder pain has returned. Pain started back up about a couple months ago. Both shoulders are hurting but the right one is worse than the left. Doesn't hurt during the day just at night.   Relevant Historical Information: hypertension   Additional pertinent review of systems negative.   Current  Outpatient Medications:    amoxicillin -clavulanate (AUGMENTIN ) 875-125 MG tablet, Take 1 tablet by mouth 2 (two) times daily., Disp: 20 tablet, Rfl: 0   atorvastatin  (LIPITOR) 10 MG tablet, TAKE 1 TABLET (10 MG TOTAL) BY MOUTH DAILY. SCHEDULE AN APPT FOR FURTHER REFILLS, Disp: 90 tablet, Rfl: 1   citalopram  (CELEXA ) 10 MG tablet, Take 1 tablet (10 mg total) by mouth daily., Disp: 90 tablet, Rfl: 1   finasteride  (PROSCAR ) 5 MG tablet, TAKE 1 TABLET (5 MG TOTAL) BY MOUTH DAILY. (Patient taking differently: Take 5 mg by mouth every other day.), Disp: 90 tablet, Rfl: 1   fluticasone  (FLONASE ) 50 MCG/ACT nasal spray, SHAKE LIQUID AND USE 2 SPRAYS IN EACH NOSTRIL DAILY, Disp: 48 mL, Rfl: 1   levocetirizine (XYZAL ) 5 MG tablet, TAKE 1 TABLET BY MOUTH EVERY DAY IN THE EVENING, Disp: 90 tablet, Rfl: 1   omega-3 acid ethyl esters (LOVAZA ) 1 g capsule, TAKE 2 CAPSULES BY MOUTH 2 TIMES DAILY., Disp: 360 capsule, Rfl: 1   Rimegepant Sulfate (NURTEC) 75 MG TBDP, Take 1 tablet (75 mg total) by mouth daily as needed., Disp: , Rfl:    sodium chloride  1 g tablet, Take 1 tablet (1 g total) by mouth 2 (two) times daily with a meal., Disp: 180 tablet, Rfl: 0   Objective:     Vitals:   11/28/24 1559  BP: (!) 138/90  Pulse: 69  SpO2: 97%  Weight: 173 lb (78.5 kg)  Height: 5' 11 (1.803 m)      Body mass index is 24.13 kg/m.    Physical Exam:    Gen: Appears well, nad, nontoxic and pleasant Neuro:sensation intact, strength is 5/5 with df/pf/inv/ev, muscle tone wnl Skin: no suspicious lesion or defmority Psych: A&O, appropriate mood and affect   Bilateral shoulder:  No deformity, swelling or muscle wasting No scapular winging FF 180, abd 180, int 0, ext 90 Right sided TTP lateral shoulder, deltoid NTTP over the [place], clavicle, ac, coracoid,   humerus,  , trapezius, cervical spine Positive empty can, O'Brien Neg neer, hawkins,  crossarm, subscap liftoff, speeds Neg ant drawer, sulcus sign,  apprehension Negative Spurling's test bilat FROM of neck    Electronically signed by:  Odis Mace D[facility] 4:20 PM 11/28/24

## 2024-11-28 ENCOUNTER — Other Ambulatory Visit: Payer: Self-pay

## 2024-11-28 ENCOUNTER — Ambulatory Visit: Admitting: Sports Medicine

## 2024-11-28 VITALS — BP 138/90 | HR 69 | Ht 71.0 in | Wt 173.0 lb

## 2024-11-28 DIAGNOSIS — S46011D Strain of muscle(s) and tendon(s) of the rotator cuff of right shoulder, subsequent encounter: Secondary | ICD-10-CM

## 2024-11-28 DIAGNOSIS — G8929 Other chronic pain: Secondary | ICD-10-CM

## 2024-11-28 NOTE — Patient Instructions (Addendum)
 Injected shoulder today. Recommend Tumeric 500 mg to 1000 mg daily for general antiinflammatory properties. Continue HEP. Follow up in 2 weeks for PRP injection.

## 2024-12-04 ENCOUNTER — Ambulatory Visit: Admitting: Internal Medicine

## 2024-12-04 ENCOUNTER — Encounter: Payer: Self-pay | Admitting: Internal Medicine

## 2024-12-04 VITALS — BP 130/86 | HR 66 | Temp 98.0°F | Resp 16 | Ht 71.0 in | Wt 170.0 lb

## 2024-12-04 DIAGNOSIS — E782 Mixed hyperlipidemia: Secondary | ICD-10-CM

## 2024-12-04 DIAGNOSIS — I10 Essential (primary) hypertension: Secondary | ICD-10-CM

## 2024-12-04 DIAGNOSIS — E781 Pure hyperglyceridemia: Secondary | ICD-10-CM

## 2024-12-04 DIAGNOSIS — Z1211 Encounter for screening for malignant neoplasm of colon: Secondary | ICD-10-CM

## 2024-12-04 DIAGNOSIS — R7989 Other specified abnormal findings of blood chemistry: Secondary | ICD-10-CM | POA: Insufficient documentation

## 2024-12-04 DIAGNOSIS — Z23 Encounter for immunization: Secondary | ICD-10-CM

## 2024-12-04 DIAGNOSIS — G43109 Migraine with aura, not intractable, without status migrainosus: Secondary | ICD-10-CM

## 2024-12-04 DIAGNOSIS — E871 Hypo-osmolality and hyponatremia: Secondary | ICD-10-CM

## 2024-12-04 MED ORDER — UBRELVY 100 MG PO TABS: 1.0000 | ORAL_TABLET | ORAL | 1 refills | Status: AC | PRN

## 2024-12-04 MED ORDER — NURTEC 75 MG PO TBDP: 1.0000 | ORAL_TABLET | Freq: Every day | ORAL | 1 refills | Status: DC | PRN

## 2024-12-04 NOTE — Progress Notes (Signed)
 Subjective:  Patient ID: Angel Combs, male    DOB: 05/29/1966  Age: 58 y.o. MRN: [id number]  CC: Hypertension and Hyperlipidemia   HPI Angel Combs presents for f/up ----  Discussed the use of AI scribe software for clinical note transcription with the patient, who gave verbal consent to proceed.  History of Present Illness Angel Combs is a 58 year old male who presents with recurrent ocular migraines.  He experiences ocular migraines characterized by visual disturbances such as 'cloudy vision' and blurred vision, lasting about 20 to 30 minutes. These episodes have increased in frequency, with three occurrences in the past week, which is unusual for him. Typically, these migraines are not associated with pain, but he did experience a headache accompanying one episode recently. He is unsure of the triggers but speculates stress might be a factor. No nausea, vomiting, numbness, weakness, or tingling occur during these episodes. He can continue with activities but finds reading difficult during an episode. He uses a prescription medication for these migraines, but is uncertain if it affects the duration or intensity of the migraines.  He is concerned about his cholesterol levels, particularly triglycerides, and is taking fish oil supplements. Despite lifestyle modifications, he remains worried about consistently high levels. He remains physically active, running with his dog, although not as much as he would like.  He has been vaccinated for flu and COVID-19 without any side effects. He received cortisone shots and PRP for shoulder issues but does not take prescription medications for joint pain.  He had a colon cancer screening three years ago with Cologuard and has no family history of colon cancer or symptoms such as abdominal pain or blood in the stool.     Outpatient Medications Prior to Visit  Medication Sig Dispense Refill   atorvastatin  (LIPITOR) 10 MG tablet TAKE 1 TABLET  (10 MG TOTAL) BY MOUTH DAILY. SCHEDULE AN APPT FOR FURTHER REFILLS 90 tablet 1   citalopram  (CELEXA ) 10 MG tablet Take 1 tablet (10 mg total) by mouth daily. 90 tablet 1   finasteride  (PROSCAR ) 5 MG tablet TAKE 1 TABLET (5 MG TOTAL) BY MOUTH DAILY. (Patient taking differently: Take 5 mg by mouth every other day.) 90 tablet 1   fluticasone  (FLONASE ) 50 MCG/ACT nasal spray SHAKE LIQUID AND USE 2 SPRAYS IN EACH NOSTRIL DAILY 48 mL 1   levocetirizine (XYZAL ) 5 MG tablet TAKE 1 TABLET BY MOUTH EVERY DAY IN THE EVENING 90 tablet 1   omega-3 acid ethyl esters (LOVAZA ) 1 g capsule TAKE 2 CAPSULES BY MOUTH 2 TIMES DAILY. 360 capsule 1   sodium chloride  1 g tablet Take 1 tablet (1 g total) by mouth 2 (two) times daily with a meal. 180 tablet 0   amoxicillin -clavulanate (AUGMENTIN ) 875-125 MG tablet Take 1 tablet by mouth 2 (two) times daily. 20 tablet 0   Rimegepant Sulfate (NURTEC) 75 MG TBDP Take 1 tablet (75 mg total) by mouth daily as needed.     No facility-administered medications prior to visit.    ROS Review of Systems  Constitutional:  Negative for appetite change, chills, diaphoresis, fatigue and fever.  HENT: Negative.    Eyes: Negative.   Respiratory: Negative.  Negative for cough, chest tightness, shortness of breath and wheezing.   Cardiovascular:  Negative for chest pain, palpitations and leg swelling.  Gastrointestinal:  Negative for abdominal pain, constipation, diarrhea, nausea and vomiting.  Genitourinary: Negative.  Negative for difficulty urinating, dysuria and hematuria.  Musculoskeletal:  Positive for arthralgias.  Negative for joint swelling and myalgias.  Skin: Negative.   Neurological:  Positive for headaches. Negative for dizziness, seizures, weakness, light-headedness and numbness.  Hematological:  Negative for adenopathy. Does not bruise/bleed easily.  Psychiatric/Behavioral: Negative.      Objective:  BP 130/86 (BP Location: Left Arm, Patient Position: Sitting, Cuff  Size: Normal)   Pulse 66   Temp 98 F (36.7 C) (Oral)   Resp 16   Ht 5' 11 (1.803 m)   Wt 170 lb (77.1 kg)   SpO2 97%   BMI 23.71 kg/m   BP Readings from Last 3 Encounters:  12/04/24 130/86  11/28/24 (!) 138/90  07/12/24 126/80    Wt Readings from Last 3 Encounters:  12/04/24 170 lb (77.1 kg)  11/28/24 173 lb (78.5 kg)  07/12/24 170 lb (77.1 kg)    Physical Exam Vitals reviewed.  Constitutional:      Appearance: Normal appearance.  HENT:     Nose: Nose normal.     Mouth/Throat:     Mouth: Mucous membranes are moist.  Eyes:     General: No scleral icterus.    Conjunctiva/sclera: Conjunctivae normal.  Cardiovascular:     Rate and Rhythm: Normal rate and regular rhythm.     Heart sounds: No murmur heard.    No friction rub. No gallop.  Pulmonary:     Effort: Pulmonary effort is normal.     Breath sounds: No stridor. No wheezing, rhonchi or rales.  Abdominal:     General: Abdomen is flat.     Palpations: There is no mass.     Tenderness: There is no abdominal tenderness. There is no guarding.     Hernia: No hernia is present.  Musculoskeletal:        General: No deformity. Normal range of motion.     Cervical back: Neck supple.     Right lower leg: No edema.     Left lower leg: No edema.  Lymphadenopathy:     Cervical: No cervical adenopathy.  Skin:    General: Skin is warm and dry.  Neurological:     General: No focal deficit present.     Mental Status: He is alert. Mental status is at baseline.  Psychiatric:        Mood and Affect: Mood normal.        Behavior: Behavior normal.     Lab Results  Component Value Date   WBC 4.7 06/15/2024   HGB 14.2 06/15/2024   HCT 41.6 06/15/2024   PLT 237.0 06/15/2024   GLUCOSE 91 06/15/2024   CHOL 178 06/15/2024   TRIG 167.0 (H) 06/15/2024   HDL 56.80 06/15/2024   LDLDIRECT 52.0 06/23/2023   LDLCALC 88 06/15/2024   ALT 22 11/19/2023   AST 27 11/19/2023   NA 131 (L) 06/15/2024   K 4.4 06/15/2024   CL 97  06/15/2024   CREATININE 1.21 06/15/2024   BUN 15 06/15/2024   CO2 30 06/15/2024   TSH 1.67 11/19/2023   PSA 0.22 06/15/2024    No results found.  Assessment & Plan:  Immunization due -     Pneumococcal conjugate vaccine 20-valent  Elevated cholesterol with elevated triglycerides -     Lipid panel; Future -     Hepatic function panel; Future  Essential hypertension- BP is well controlled. -     Urinalysis, Routine w reflex microscopic; Future -     Basic metabolic panel with GFR; Future  Pure hyperglyceridemia -  Lipid panel; Future  Chronic hyponatremia -     TSH; Future -     Sodium, urine, random; Future  Elevated LFTs -     Hepatitis B surface antibody,quantitative; Future  Migraine with aura and without status migrainosus, not intractable -     Ubrelvy ; Take 1 tablet (100 mg total) by mouth as needed.  Dispense: 30 tablet; Refill: 1  Screening for colon cancer -     Cologuard     Follow-up: Return in about 6 months (around 06/04/2025).  Debby Molt, MD

## 2024-12-04 NOTE — Patient Instructions (Signed)
Hyponatremia Hyponatremia is when the amount of salt (sodium) in your blood is too low. When salt levels are low, your body cells may take in extra water. This can cause swelling. The swelling often affects the brain. What are the causes? Certain medical problems or conditions. Vomiting a lot. Having watery poop (diarrhea) often. Sweating too much. Taking certain medicines or using illegal drugs. Fluids given through an IV tube. What increases the risk? Having heart, kidney, or liver failure. Having a medical condition that causes you to have watery poop a lot. Doing very hard exercises. Taking medicines that affect the amount of salt that is in your blood. What are the signs or symptoms? Symptoms of this condition include: Headache. Feeling like you may vomit (nausea). Vomiting. Being very tired. Muscle weakness and cramps. Not wanting to eat as much as normal. Feeling weak or dizzy. Very bad symptoms of this condition include: Confusion. Feeling restless. Having a fast heart rate. Fainting. Seizures. Coma. How is this treated? Treatment for this condition depends on the cause. Treatment may include: Getting fluids through an IV tube that is put into one of your veins. Taking medicines to fix the salt levels in your blood. If medicines are causing the problem, your medicines will need to be changed. Limiting how much water or fluid you take in, in some cases. Monitoring in the hospital to watch your symptoms. Follow these instructions at home:  Take over-the-counter and prescription medicines only as told by your doctor. Many medicines can make this condition worse. Talk with your doctor about any medicines that you are taking. Do not drink alcohol. Keep all follow-up visits. Contact a doctor if: You feel more like you may vomit. You feel more tired. Your headache gets worse. You feel more confused. You feel weaker. Your symptoms go away and then they come back. Get  help right away if: You have a seizure. You faint. You keep having watery poop. You keep vomiting. Summary Hyponatremia is when the amount of salt in your blood is too low. When salt levels are low, you can have swelling throughout the body. The swelling mostly affects the brain. Treatment depends on the cause. Treatment may include IV fluids and changing medicines. This information is not intended to replace advice given to you by your health care provider. Make sure you discuss any questions you have with your health care provider. Document Revised: 06/24/2021 Document Reviewed: 06/24/2021 Elsevier Patient Education  2024 [employer].

## 2024-12-05 ENCOUNTER — Telehealth: Payer: Self-pay

## 2024-12-05 ENCOUNTER — Other Ambulatory Visit (HOSPITAL_COMMUNITY): Payer: Self-pay

## 2024-12-05 NOTE — Telephone Encounter (Signed)
 Pharmacy Patient Advocate Encounter   Received notification from CoverMyMeds that prior authorization for Ubrelvy  100mg  tabs is required/requested.   Insurance verification completed.   The patient is insured through [employer].   Per test claim: PA required; PA started via CoverMyMeds. KEY [id number] . Waiting for clinical questions to populate.

## 2024-12-05 NOTE — Telephone Encounter (Signed)
 Clinical questions answered and PA submitted.

## 2024-12-05 NOTE — Telephone Encounter (Signed)
 Pharmacy Patient Advocate Encounter   Received notification from CoverMyMeds that prior authorization for Nurtec 75mg  tabs is required/requested.   Insurance verification completed.   The patient is insured through [employer].   Per the chart notes, Nurtec has been discontinued. Please advise if we should continue with the PA.   Key: [id number]

## 2024-12-05 NOTE — Telephone Encounter (Signed)
 Patient states that he is currently taking this. Please do the PA

## 2024-12-07 NOTE — Telephone Encounter (Signed)
 Pharmacy Patient Advocate Encounter  Received notification from [employer] that Prior Authorization for Ubrelvy  100mg  tabs has been DENIED.  See denial reason below. No denial letter attached in CMM. Will attach denial letter to Media tab once received.   PA #/Case ID/Reference #: [id number]

## 2024-12-07 NOTE — Telephone Encounter (Signed)
 Pharmacy Patient Advocate Encounter   Received notification from Pt Calls Messages that prior authorization for [employer] is required/requested.   Insurance verification completed.   The patient is insured through [employer].   Per test claim: PA required; PA submitted to above mentioned insurance via Latent Key/confirmation #/EOC [id number] Status is pending

## 2024-12-08 NOTE — Telephone Encounter (Signed)
 Pharmacy Patient Advocate Encounter  Received notification from [employer] that Prior Authorization for Nurtec 75mg  tabs has been DENIED.  See denial reason below. No denial letter attached in CMM. Will attach denial letter to Media tab once received.   PA #/Case ID/Reference #: [id number]

## 2024-12-08 NOTE — Telephone Encounter (Signed)
 Patient has been made aware of the denial via VM.

## 2024-12-08 NOTE — Telephone Encounter (Signed)
 Patient has been made aware of the denial Via VM

## 2024-12-11 NOTE — Progress Notes (Unsigned)
 Angel Combs D[facility] [address] [place] [address] Phone: [phone]   Assessment and Plan:     1. Chronic right shoulder pain (Primary) 2. Rotator cuff strain, right, subsequent encounter 3. Chronic left shoulder pain 4. Rotator cuff strain, left, subsequent encounter -Chronic with exacerbation, subsequent visit - Continued flares of bilateral shoulder pain most consistent with rotator cuff tendinosis - Patient has benefited from CSI and PRP in the past.  Elected for repeat bilateral PRP today.  Tolerated well per note below - Use Tylenol  500 to 1000 mg tablets 2-3 times a day for day-to-day pain relief - Recommend relative rest over the next 4 weeks with gradual return to activity, home exercise program and weeks 4 through 6  Procedure: Ultrasound Guided greater tuberosity Injection Side: Bilateral Diagnosis: Subacromial/subdeltoid bursitis US  Indication:  - accuracy is paramount for diagnosis - to ensure therapeutic efficacy or procedural success - to reduce procedural risk   After explaining the procedure, viable alternatives, risks, and answering any questions, consent was given verbally.  Patient's blood was obtained and prepared by the staff using Tendon  PRP centrifuge preporation.The site was cleaned with chlorhexidine prep. An ultrasound transducer was placed on the lateral shoulder.  The supraspinatus was identified.  An injection was performed under ultrasound guidance with sterile technique using PRP 3.5 mL.  This was well tolerated.  The needle was removed and dressing placed and post injection instructions were given including a discussion of likely return of pain today.  Procedure was repeated on contralateral side.  Pt was advised to not ice area or use NSAIDs for the next 6 weeks.   Pt was advised to call or return to clinic if these symptoms worsen or fail to improve as anticipated. Images permanently stored.      Pertinent previous records reviewed include none   Follow Up: 4 to 6 weeks for reevaluation.  Could consider physical therapy   Subjective:   I, Sonita Michiels, am serving as a neurosurgeon for Doctor Morene Mace   Chief Complaint: right shoulder pain    HPI:    11/19/23 Patient is a 58 year old male with concerns of left hip pain. Patient states left hip pain for a week. No MOI. He ran on the beach. Pain has improved. Pain radiates down the leg and into the groin. Diclofenac  for the pain and that helps some. No numbness or tingling     01/05/2024 Patient states left hip is feeling better.    Right shoulder 2 months intermittent pain. Isn't able to sleep in right side.hx of left shoulder treated with PRP. Thinks it was a gym exercises was lifting heavier with bench press. No numbness or tingling. No radiating pain. Anterior shoulder pain. Meloxicam  helped but only took it once. ROM limited    02/02/2024 Patient states he is great. Still has the arm and shoulder pain but it is not terrible.    04/04/2024 Patient states shoulder pain came back. CSI worked for a while last 2-3 weeks pain has come back. Right shoulder pain just when he wakes up    04/05/2024 PRP    05/17/2024 Patient states he still feels pain . Some improvement but not enough. Pain at night he does sleep on the right side    06/07/2024 Patient states he is doing great. Pain after he works out but he knows its because he's getting back into working out.    11/28/24 Patient states that the  right shoulder pain has returned. Pain started back up about a couple months ago. Both shoulders are hurting but the right one is worse than the left. Doesn't hurt during the day just at night.   12/12/2024 Patient states   Relevant Historical Information: hypertension   Additional pertinent review of systems negative.  Current Medications[1]       Electronically signed by:  Odis Mace [facility] 9:21 AM 12/12/2024     [1]  Current Outpatient Medications:    atorvastatin  (LIPITOR) 10 MG tablet, TAKE 1 TABLET (10 MG TOTAL) BY MOUTH DAILY. SCHEDULE AN APPT FOR FURTHER REFILLS, Disp: 90 tablet, Rfl: 1   citalopram  (CELEXA ) 10 MG tablet, Take 1 tablet (10 mg total) by mouth daily., Disp: 90 tablet, Rfl: 1   finasteride  (PROSCAR ) 5 MG tablet, TAKE 1 TABLET (5 MG TOTAL) BY MOUTH DAILY. (Patient taking differently: Take 5 mg by mouth every other day.), Disp: 90 tablet, Rfl: 1   fluticasone  (FLONASE ) 50 MCG/ACT nasal spray, SHAKE LIQUID AND USE 2 SPRAYS IN EACH NOSTRIL DAILY, Disp: 48 mL, Rfl: 1   levocetirizine (XYZAL ) 5 MG tablet, TAKE 1 TABLET BY MOUTH EVERY DAY IN THE EVENING, Disp: 90 tablet, Rfl: 1   omega-3 acid ethyl esters (LOVAZA ) 1 g capsule, TAKE 2 CAPSULES BY MOUTH 2 TIMES DAILY., Disp: 360 capsule, Rfl: 1   sodium chloride  1 g tablet, Take 1 tablet (1 g total) by mouth 2 (two) times daily with a meal., Disp: 180 tablet, Rfl: 0   Ubrogepant  (UBRELVY ) 100 MG TABS, Take 1 tablet (100 mg total) by mouth as needed., Disp: 30 tablet, Rfl: 1

## 2024-12-12 ENCOUNTER — Ambulatory Visit: Payer: Self-pay

## 2024-12-12 ENCOUNTER — Ambulatory Visit: Payer: Self-pay | Admitting: Sports Medicine

## 2024-12-12 DIAGNOSIS — S46012D Strain of muscle(s) and tendon(s) of the rotator cuff of left shoulder, subsequent encounter: Secondary | ICD-10-CM

## 2024-12-12 DIAGNOSIS — G8929 Other chronic pain: Secondary | ICD-10-CM

## 2024-12-12 DIAGNOSIS — S46011D Strain of muscle(s) and tendon(s) of the rotator cuff of right shoulder, subsequent encounter: Secondary | ICD-10-CM

## 2024-12-12 NOTE — Patient Instructions (Signed)
4-6 week follow up

## 2024-12-24 LAB — COLOGUARD: COLOGUARD: NEGATIVE

## 2025-01-08 NOTE — Progress Notes (Unsigned)
 "               Odis Mace [facility] [address] [place] [address] Phone: [phone]   Assessment and Plan:     1. Chronic right shoulder pain (Primary) 2. Rotator cuff strain, right, subsequent encounter 3. Chronic left shoulder pain 4. Rotator cuff strain, left, subsequent encounter -Chronic with exacerbation, subsequent visit - Overall significant improvement in bilateral shoulder pain.  Consistent with resolving flare of rotator cuff tendinopathy after bilateral PRP targeted at greater tuberosity performed on 12/12/2024 -Recommend gradual return to physical activity.  Start activity at 50% (speed, duration, reps, sets, intensity) and allow 24 hours to assess for worsening pain.  If 50% is well-tolerated, may increase next activity to 75%.  If 75% is well-tolerated, may increase next physical activity to 100%.  If any of these levels cause pain, recommend dropping down to previous level for an additional 2-3 attempts before advancing. -May restart icing and NSAID use.  Use meloxicam  15 mg daily as needed for breakthrough pain.  Recommend limiting chronic NSAIDs to 1-2 doses per week to prevent long-term side effects. Use Tylenol  500 to 1000 mg tablets 2-3 times a day as needed for day-to-day pain relief.       Pertinent previous records reviewed include none   Follow Up: As needed if no improvement or worsening of symptoms.  Patient could call for PT referral if needed   Subjective:    ChiefI, Chestine Reeves, am serving as a neurosurgeon for Doctor Morene Mace    Chief Complaint: right shoulder pain    HPI:    11/19/23 Patient is a 59 year old male with concerns of left hip pain. Patient states left hip pain for a week. No MOI. He ran on the beach. Pain has improved. Pain radiates down the leg and into the groin. Diclofenac  for the pain and that helps some. No numbness or tingling     01/05/2024 Patient states left hip is feeling better.     Right shoulder 2 months intermittent pain. Isn't able to sleep in right side.hx of left shoulder treated with PRP. Thinks it was a gym exercises was lifting heavier with bench press. No numbness or tingling. No radiating pain. Anterior shoulder pain. Meloxicam  helped but only took it once. ROM limited    02/02/2024 Patient states he is great. Still has the arm and shoulder pain but it is not terrible.    04/04/2024 Patient states shoulder pain came back. CSI worked for a while last 2-3 weeks pain has come back. Right shoulder pain just when he wakes up    04/05/2024 PRP    05/17/2024 Patient states he still feels pain . Some improvement but not enough. Pain at night he does sleep on the right side    06/07/2024 Patient states he is doing great. Pain after he works out but he knows its because he's getting back into working out.    11/28/24 Patient states that the right shoulder pain has returned. Pain started back up about a couple months ago. Both shoulders are hurting but the right one is worse than the left. Doesn't hurt during the day just at night.    12/12/2024 Patient states  01/09/2025 Patient states he is doing well. States he has been taking it easy   Relevant Historical Information: hypertension   Additional pertinent review of systems negative.  Current Medications[1]   Objective:     Vitals:  01/09/25 0901  Pulse: 65  SpO2: 98%  Weight: 170 lb (77.1 kg)  Height: 5' 11 (1.803 m)      Body mass index is 23.71 kg/m.    Physical Exam:    Gen: Appears well, nad, nontoxic and pleasant Neuro:sensation intact, strength is 5/5, muscle tone wnl Skin: no suspicious lesion or defmority Psych: A&O, appropriate mood and affect  Bilateral shoulder:  No deformity, swelling or muscle wasting No scapular winging FF 180, abd 180, int 0, ext 90 NTTP over the [place], clavicle, ac, coracoid, biceps groove, humerus, deltoid, trapezius, cervical spine Neg neer, hawkins, empty  can, obriens, crossarm, subscap liftoff, speeds Neg ant drawer, sulcus sign, apprehension Negative Spurling's test bilat FROM of neck    Electronically signed by:  Odis Mace [facility] 9:11 AM 01/09/2025     [1]  Current Outpatient Medications:    atorvastatin  (LIPITOR) 10 MG tablet, TAKE 1 TABLET (10 MG TOTAL) BY MOUTH DAILY. SCHEDULE AN APPT FOR FURTHER REFILLS, Disp: 90 tablet, Rfl: 1   citalopram  (CELEXA ) 10 MG tablet, Take 1 tablet (10 mg total) by mouth daily., Disp: 90 tablet, Rfl: 1   finasteride  (PROSCAR ) 5 MG tablet, TAKE 1 TABLET (5 MG TOTAL) BY MOUTH DAILY. (Patient taking differently: Take 5 mg by mouth every other day.), Disp: 90 tablet, Rfl: 1   fluticasone  (FLONASE ) 50 MCG/ACT nasal spray, SHAKE LIQUID AND USE 2 SPRAYS IN EACH NOSTRIL DAILY, Disp: 48 mL, Rfl: 1   levocetirizine (XYZAL ) 5 MG tablet, TAKE 1 TABLET BY MOUTH EVERY DAY IN THE EVENING, Disp: 90 tablet, Rfl: 1   omega-3 acid ethyl esters (LOVAZA ) 1 g capsule, TAKE 2 CAPSULES BY MOUTH 2 TIMES DAILY., Disp: 360 capsule, Rfl: 1   sodium chloride  1 g tablet, Take 1 tablet (1 g total) by mouth 2 (two) times daily with a meal., Disp: 180 tablet, Rfl: 0   Ubrogepant  (UBRELVY ) 100 MG TABS, Take 1 tablet (100 mg total) by mouth as needed., Disp: 30 tablet, Rfl: 1  "

## 2025-01-09 ENCOUNTER — Ambulatory Visit: Admitting: Sports Medicine

## 2025-01-09 VITALS — HR 65 | Ht 71.0 in | Wt 170.0 lb

## 2025-01-09 DIAGNOSIS — M25511 Pain in right shoulder: Secondary | ICD-10-CM | POA: Diagnosis not present

## 2025-01-09 DIAGNOSIS — S46012D Strain of muscle(s) and tendon(s) of the rotator cuff of left shoulder, subsequent encounter: Secondary | ICD-10-CM

## 2025-01-09 DIAGNOSIS — S46011D Strain of muscle(s) and tendon(s) of the rotator cuff of right shoulder, subsequent encounter: Secondary | ICD-10-CM

## 2025-01-09 DIAGNOSIS — G8929 Other chronic pain: Secondary | ICD-10-CM

## 2025-01-16 ENCOUNTER — Encounter: Payer: Self-pay | Admitting: Internal Medicine

## 2025-01-17 ENCOUNTER — Other Ambulatory Visit: Payer: Self-pay

## 2025-01-17 DIAGNOSIS — L659 Nonscarring hair loss, unspecified: Secondary | ICD-10-CM

## 2025-01-17 MED ORDER — FINASTERIDE 5 MG PO TABS: 5.0000 mg | ORAL_TABLET | Freq: Every day | ORAL | 0 refills | Status: AC

## 2025-01-25 ENCOUNTER — Other Ambulatory Visit

## 2025-01-25 DIAGNOSIS — E781 Pure hyperglyceridemia: Secondary | ICD-10-CM

## 2025-01-25 DIAGNOSIS — R7989 Other specified abnormal findings of blood chemistry: Secondary | ICD-10-CM

## 2025-01-25 DIAGNOSIS — I10 Essential (primary) hypertension: Secondary | ICD-10-CM

## 2025-01-25 DIAGNOSIS — E782 Mixed hyperlipidemia: Secondary | ICD-10-CM

## 2025-01-25 DIAGNOSIS — E871 Hypo-osmolality and hyponatremia: Secondary | ICD-10-CM

## 2025-01-25 LAB — BASIC METABOLIC PANEL WITH GFR
BUN: 14 mg/dL (ref 6–23)
CO2: 29 meq/L (ref 19–32)
Calcium: 9.4 mg/dL (ref 8.4–10.5)
Chloride: 100 meq/L (ref 96–112)
Creatinine, Ser: 1.26 mg/dL (ref 0.40–1.50)
GFR: 63.6 mL/min
Glucose, Bld: 94 mg/dL (ref 70–99)
Potassium: 4.3 meq/L (ref 3.5–5.1)
Sodium: 135 meq/L (ref 135–145)

## 2025-01-25 LAB — LIPID PANEL
Cholesterol: 161 mg/dL (ref 28–200)
HDL: 52.8 mg/dL
LDL Cholesterol: 82 mg/dL (ref 10–99)
NonHDL: 108.57
Total CHOL/HDL Ratio: 3
Triglycerides: 132 mg/dL (ref 10.0–149.0)
VLDL: 26.4 mg/dL (ref 0.0–40.0)

## 2025-01-25 LAB — URINALYSIS, ROUTINE W REFLEX MICROSCOPIC
Bilirubin Urine: NEGATIVE
Hgb urine dipstick: NEGATIVE
Leukocytes,Ua: NEGATIVE
Nitrite: NEGATIVE
Specific Gravity, Urine: 1.015 (ref 1.000–1.030)
Total Protein, Urine: NEGATIVE
Urine Glucose: NEGATIVE
Urobilinogen, UA: 0.2 (ref 0.0–1.0)
pH: 6 (ref 5.0–8.0)

## 2025-01-25 LAB — HEPATIC FUNCTION PANEL
ALT: 23 U/L (ref 3–53)
AST: 21 U/L (ref 5–37)
Albumin: 4.5 g/dL (ref 3.5–5.2)
Alkaline Phosphatase: 45 U/L (ref 39–117)
Bilirubin, Direct: 0.1 mg/dL (ref 0.1–0.3)
Total Bilirubin: 0.6 mg/dL (ref 0.2–1.2)
Total Protein: 7.4 g/dL (ref 6.0–8.3)

## 2025-01-25 LAB — SODIUM, URINE, RANDOM: Sodium, Ur: 44 mmol/L (ref 28–272)

## 2025-01-25 LAB — TSH: TSH: 2 u[IU]/mL (ref 0.35–5.50)

## 2025-01-25 LAB — HEPATITIS B SURFACE ANTIBODY, QUANTITATIVE: Hep B S AB Quant (Post): 85 m[IU]/mL

## 2025-01-26 ENCOUNTER — Ambulatory Visit: Payer: Self-pay | Admitting: Internal Medicine

## 2025-06-04 ENCOUNTER — Ambulatory Visit: Admitting: Internal Medicine
